# Patient Record
Sex: Female | Born: 1985
Health system: Southern US, Community
[De-identification: ages and names within clinical notes are randomized; demographics above are authoritative.]

## PROBLEM LIST (undated history)

## (undated) DIAGNOSIS — IMO0002 Reserved for concepts with insufficient information to code with codable children: Secondary | ICD-10-CM

## (undated) DIAGNOSIS — U071 COVID-19: Secondary | ICD-10-CM

## (undated) DIAGNOSIS — Z87440 Personal history of urinary (tract) infections: Secondary | ICD-10-CM

## (undated) DIAGNOSIS — I10 Essential (primary) hypertension: Secondary | ICD-10-CM

## (undated) DIAGNOSIS — F429 Obsessive-compulsive disorder, unspecified: Secondary | ICD-10-CM

## (undated) DIAGNOSIS — Z8619 Personal history of other infectious and parasitic diseases: Secondary | ICD-10-CM

## (undated) HISTORY — PX: WISDOM TOOTH EXTRACTION: SHX21

## (undated) HISTORY — DX: COVID-19: U07.1

## (undated) HISTORY — DX: Personal history of other infectious and parasitic diseases: Z86.19

## (undated) HISTORY — DX: Obsessive-compulsive disorder, unspecified: F42.9

## (undated) HISTORY — DX: Personal history of urinary (tract) infections: Z87.440

---

## 2012-12-27 ENCOUNTER — Inpatient Hospital Stay: Payer: Self-pay | Admitting: Obstetrics and Gynecology

## 2012-12-27 LAB — CBC WITH DIFFERENTIAL/PLATELET
Basophil #: 0 10*3/uL (ref 0.0–0.1)
Basophil %: 0.3 %
HGB: 12 g/dL (ref 12.0–16.0)
Lymphocyte %: 12.9 %
MCH: 29.7 pg (ref 26.0–34.0)
MCHC: 34.5 g/dL (ref 32.0–36.0)
Monocyte %: 7.2 %
Neutrophil #: 13.1 10*3/uL — ABNORMAL HIGH (ref 1.4–6.5)
Platelet: 227 10*3/uL (ref 150–440)
RBC: 4.05 10*6/uL (ref 3.80–5.20)
WBC: 16.5 10*3/uL — ABNORMAL HIGH (ref 3.6–11.0)

## 2012-12-28 LAB — HEMATOCRIT: HCT: 30.3 % — ABNORMAL LOW (ref 35.0–47.0)

## 2013-09-25 ENCOUNTER — Ambulatory Visit: Payer: Self-pay

## 2013-09-25 LAB — RAPID STREP-A WITH REFLX: MICRO TEXT REPORT: NEGATIVE

## 2013-09-25 LAB — RAPID INFLUENZA A&B ANTIGENS (ARMC ONLY)

## 2013-09-27 LAB — BETA STREP CULTURE(ARMC)

## 2013-10-09 ENCOUNTER — Emergency Department: Payer: Self-pay | Admitting: Internal Medicine

## 2013-10-12 ENCOUNTER — Emergency Department: Payer: Self-pay | Admitting: Emergency Medicine

## 2013-10-16 ENCOUNTER — Encounter: Payer: Self-pay | Admitting: Emergency Medicine

## 2013-10-23 ENCOUNTER — Ambulatory Visit: Payer: Self-pay | Admitting: Family Medicine

## 2013-11-12 ENCOUNTER — Encounter: Payer: Self-pay | Admitting: Emergency Medicine

## 2014-03-24 ENCOUNTER — Ambulatory Visit: Payer: Self-pay | Admitting: Family Medicine

## 2014-03-24 LAB — CBC WITH DIFFERENTIAL/PLATELET
BASOS PCT: 0.6 %
Basophil #: 0 10*3/uL (ref 0.0–0.1)
Eosinophil #: 0.1 10*3/uL (ref 0.0–0.7)
Eosinophil %: 1 %
HCT: 44.2 % (ref 35.0–47.0)
HGB: 14.5 g/dL (ref 12.0–16.0)
LYMPHS PCT: 29.6 %
Lymphocyte #: 2 10*3/uL (ref 1.0–3.6)
MCH: 30 pg (ref 26.0–34.0)
MCHC: 32.9 g/dL (ref 32.0–36.0)
MCV: 91 fL (ref 80–100)
MONOS PCT: 5.8 %
Monocyte #: 0.4 x10 3/mm (ref 0.2–0.9)
NEUTROS PCT: 63 %
Neutrophil #: 4.2 10*3/uL (ref 1.4–6.5)
Platelet: 314 10*3/uL (ref 150–440)
RBC: 4.85 10*6/uL (ref 3.80–5.20)
RDW: 12.9 % (ref 11.5–14.5)
WBC: 6.6 10*3/uL (ref 3.6–11.0)

## 2014-03-24 LAB — BASIC METABOLIC PANEL
Anion Gap: 11 (ref 7–16)
BUN: 13 mg/dL (ref 7–18)
CO2: 26 mmol/L (ref 21–32)
Calcium, Total: 9.5 mg/dL (ref 8.5–10.1)
Chloride: 105 mmol/L (ref 98–107)
Creatinine: 0.62 mg/dL (ref 0.60–1.30)
EGFR (Non-African Amer.): >60
Glucose: 93 mg/dL (ref 65–99)
Osmolality: 283 (ref 275–301)
POTASSIUM: 4 mmol/L (ref 3.5–5.1)
Sodium: 142 mmol/L (ref 136–145)

## 2014-03-24 LAB — TSH: Thyroid Stimulating Horm: 0.724 u[IU]/mL

## 2014-06-01 ENCOUNTER — Ambulatory Visit (INDEPENDENT_AMBULATORY_CARE_PROVIDER_SITE_OTHER): Payer: No Typology Code available for payment source | Admitting: Internal Medicine

## 2014-06-01 ENCOUNTER — Encounter: Payer: Self-pay | Admitting: Internal Medicine

## 2014-06-01 ENCOUNTER — Encounter (INDEPENDENT_AMBULATORY_CARE_PROVIDER_SITE_OTHER): Payer: Self-pay

## 2014-06-01 VITALS — BP 110/80 | HR 100 | Temp 98.3°F | Ht 65.0 in | Wt 112.5 lb

## 2014-06-01 DIAGNOSIS — Z13220 Encounter for screening for lipoid disorders: Secondary | ICD-10-CM

## 2014-06-01 DIAGNOSIS — F429 Obsessive-compulsive disorder, unspecified: Secondary | ICD-10-CM

## 2014-06-01 DIAGNOSIS — E0789 Other specified disorders of thyroid: Secondary | ICD-10-CM

## 2014-06-01 DIAGNOSIS — F42 Obsessive-compulsive disorder: Secondary | ICD-10-CM

## 2014-06-01 NOTE — Progress Notes (Signed)
Pre visit review using our clinic review tool, if applicable. No additional management support is needed unless otherwise documented below in the visit note. 

## 2014-06-07 ENCOUNTER — Encounter: Payer: Self-pay | Admitting: Internal Medicine

## 2014-06-07 DIAGNOSIS — F429 Obsessive-compulsive disorder, unspecified: Secondary | ICD-10-CM | POA: Insufficient documentation

## 2014-06-07 DIAGNOSIS — E0789 Other specified disorders of thyroid: Secondary | ICD-10-CM | POA: Insufficient documentation

## 2014-06-07 NOTE — Assessment & Plan Note (Signed)
Thyroid fullness noted on exam.  Schedule thyroid ultrasound.  Check thyroid function tests.

## 2014-06-07 NOTE — Assessment & Plan Note (Signed)
Recently diagnosed.  On zoloft.  Followed by her therapist and Dr Marc Morgansoddle.  No suicidal or homicidal thoughts.  Does feel some better.

## 2014-06-07 NOTE — Progress Notes (Signed)
   Subjective:    Patient ID: Becky Mccoy, female    DOB: Oct 20, 1985, 28 y.o.   MRN: [id number]  HPI 28 year old female with past history of OCD.  Recently diagnosed.  Comes in today to establish care.  [facility]  Up to date with exams.  All paps normal.  Seeing Dr Marc Morgansoddle [facility] now.  Started zoloft five weeks ago.  On 200mg  per day now.  Also seeing a therapist.  Takes xanax q hs.  She is more on a schedule eating now.  Stopped 6 months ago - breast feeding.  Works at [employer].  States her OCD was diagnosed recently.  States has a lot of thoughts racing through her brain.  Is doing better.  No suicidal or homicidal thoughts. LMP 05/19/14.  Uses condoms now.     Past Medical History  Diagnosis Date  . History of chicken pox   . Hx: UTI (urinary tract infection)   . OCD (obsessive compulsive disorder)     Outpatient Encounter Prescriptions as of 06/01/2014  Medication Sig  . ALPRAZolam (XANAX) 0.25 MG tablet Take 0.25-0.5 mg by mouth at bedtime as needed for anxiety.  . sertraline (ZOLOFT) 100 MG tablet Take 200 mg by mouth daily.     Review of Systems Patient denies any headache, lightheadedness or dizziness.  No sinus or allergy symptoms.  No chest pain, tightness or palpitations.  No increased shortness of breath, cough or congestion.  No nausea or vomiting.  No abdominal pain or cramping.  No bowel change, such as diarrhea, constipation, BRBPR or melana.  No urine change.  OCD diagnosed recently as outlined.  Some better.  Seeing her psychiatrist and a therapist.       Objective:   Physical Exam Filed Vitals:   06/01/14 0909  BP: 110/80  Pulse: 100  Temp: 98.3 F (2836.278 C)   28 year old female in no acute distress.   HEENT:  Nares- clear.  Oropharynx - without lesions. NECK:  Supple.  Nontender.  No audible bruit.  Appears to have increased thyroid fullness.   HEART:  Appears to be regular. LUNGS:  No crackles or wheezing audible.   Respirations even and unlabored.  RADIAL PULSE:  Equal bilaterally.  ABDOMEN:  Soft, nontender.  Bowel sounds present and normal.  No audible abdominal bruit.   EXTREMITIES:  No increased edema present.  DP pulses palpable and equal bilaterally.          Assessment & Plan:  HEALTH MAINTENANCE.  States up to date with breast, pelvic and pap smears.  Check routine labs.  [facility].    Problem List Items Addressed This Visit   OCD (obsessive compulsive disorder)     Recently diagnosed.  On zoloft.  Followed by her therapist and Dr Marc Morgansoddle.  No suicidal or homicidal thoughts.  Does feel some better.      Thyroid fullness - Primary     Thyroid fullness noted on exam.  Schedule thyroid ultrasound.  Check thyroid function tests.       Relevant Orders      US Soft Tissue Head/Neck     I spent 30 minutes with the patient and more than 50% of the time was spent in consultation regarding the above.

## 2014-08-31 ENCOUNTER — Other Ambulatory Visit: Payer: No Typology Code available for payment source

## 2014-09-02 ENCOUNTER — Ambulatory Visit (INDEPENDENT_AMBULATORY_CARE_PROVIDER_SITE_OTHER): Payer: No Typology Code available for payment source | Admitting: Internal Medicine

## 2014-09-02 ENCOUNTER — Encounter: Payer: Self-pay | Admitting: *Deleted

## 2014-09-02 ENCOUNTER — Encounter: Payer: Self-pay | Admitting: Internal Medicine

## 2014-09-02 DIAGNOSIS — E0789 Other specified disorders of thyroid: Secondary | ICD-10-CM

## 2014-09-02 DIAGNOSIS — Z13220 Encounter for screening for lipoid disorders: Secondary | ICD-10-CM

## 2014-09-02 DIAGNOSIS — F42 Obsessive-compulsive disorder: Secondary | ICD-10-CM

## 2014-09-02 DIAGNOSIS — F429 Obsessive-compulsive disorder, unspecified: Secondary | ICD-10-CM

## 2014-09-02 LAB — CBC WITH DIFFERENTIAL/PLATELET
Basophils Absolute: 0 10*3/uL (ref 0.0–0.1)
Basophils Relative: 0.5 % (ref 0.0–3.0)
Eosinophils Absolute: 0.1 10*3/uL (ref 0.0–0.7)
Eosinophils Relative: 2.5 % (ref 0.0–5.0)
HCT: 41.8 % (ref 36.0–46.0)
Hemoglobin: 14.4 g/dL (ref 12.0–15.0)
LYMPHS PCT: 33.1 % (ref 12.0–46.0)
Lymphs Abs: 2 10*3/uL (ref 0.7–4.0)
MCHC: 34.5 g/dL (ref 30.0–36.0)
MCV: 87.8 fl (ref 78.0–100.0)
MONOS PCT: 5.5 % (ref 3.0–12.0)
Monocytes Absolute: 0.3 10*3/uL (ref 0.1–1.0)
NEUTROS ABS: 3.5 10*3/uL (ref 1.4–7.7)
Neutrophils Relative %: 58.4 % (ref 43.0–77.0)
Platelets: 243 10*3/uL (ref 150.0–400.0)
RBC: 4.75 Mil/uL (ref 3.87–5.11)
RDW: 13.1 % (ref 11.5–15.5)
WBC: 6 10*3/uL (ref 4.0–10.5)

## 2014-09-02 LAB — LIPID PANEL
CHOLESTEROL: 214 mg/dL — AB (ref 0–200)
HDL: 48.1 mg/dL (ref >39.00)
LDL Cholesterol: 130 mg/dL — ABNORMAL HIGH (ref 0–99)
NonHDL: 165.9
TRIGLYCERIDES: 179 mg/dL — AB (ref 0.0–149.0)
Total CHOL/HDL Ratio: 4
VLDL: 35.8 mg/dL (ref 0.0–40.0)

## 2014-09-02 LAB — COMPREHENSIVE METABOLIC PANEL
ALBUMIN: 4.5 g/dL (ref 3.5–5.2)
ALT: 17 U/L (ref 0–35)
AST: 20 U/L (ref 0–37)
Alkaline Phosphatase: 66 U/L (ref 39–117)
BUN: 18 mg/dL (ref 6–23)
CHLORIDE: 105 meq/L (ref 96–112)
CO2: 30 mEq/L (ref 19–32)
Calcium: 9.4 mg/dL (ref 8.4–10.5)
Creatinine, Ser: 0.61 mg/dL (ref 0.40–1.20)
GFR: 124.37 mL/min (ref >60.00)
Glucose, Bld: 87 mg/dL (ref 70–99)
POTASSIUM: 4.7 meq/L (ref 3.5–5.1)
SODIUM: 140 meq/L (ref 135–145)
TOTAL PROTEIN: 6.8 g/dL (ref 6.0–8.3)
Total Bilirubin: 0.6 mg/dL (ref 0.2–1.2)

## 2014-09-02 LAB — T4, FREE: FREE T4: 0.67 ng/dL (ref 0.60–1.60)

## 2014-09-02 LAB — TSH: TSH: 1.38 u[IU]/mL (ref 0.35–4.50)

## 2014-09-02 LAB — T3, FREE: T3, Free: 3.4 pg/mL (ref 2.3–4.2)

## 2014-09-02 NOTE — Progress Notes (Signed)
Pre visit review using our clinic review tool, if applicable. No additional management support is needed unless otherwise documented below in the visit note. 

## 2014-09-06 ENCOUNTER — Encounter: Payer: Self-pay | Admitting: Internal Medicine

## 2014-09-06 NOTE — Progress Notes (Signed)
   Subjective:    Patient ID: Becky Mccoy, female    DOB: Jun 02, 1986, 29 y.o.   MRN: [id number]  HPI 29 year old female with past history of OCD.  She comes in today for a scheduled follow up.  [facility].  Up to date with exams.  All paps normal.  Seeing Dr Marc Morgansoddle ([facility]).  On zoloft 200mg  per day.  Doing well with this.  feels better.  Functioning better.  Concentrating better.  Overall feels good.  Eating better.      Past Medical History  Diagnosis Date  . History of chicken pox   . Hx: UTI (urinary tract infection)   . OCD (obsessive compulsive disorder)     Outpatient Encounter Prescriptions as of 09/02/2014  Medication Sig  . clonazePAM (KLONOPIN) 1 MG tablet Take 0.5-1 mg by mouth at bedtime as needed for anxiety.  . sertraline (ZOLOFT) 100 MG tablet Take 200 mg by mouth daily.   . [DISCONTINUED] ALPRAZolam (XANAX) 0.25 MG tablet Take 0.25-0.5 mg by mouth at bedtime as needed for anxiety.    Review of Systems Patient denies any headache, lightheadedness or dizziness.  No sinus or allergy symptoms.  No chest pain, tightness or palpitations. No increased shortness of breath, cough or congestion.  No nausea or vomiting.  No abdominal pain or cramping.  No bowel change, such as diarrhea, constipation.  OCD diagnosed recently as outlined.  Seeing her psychiatrist and a therapist.  On zoloft 200mg  per day and doing well with this.  Feels better.       Objective:   Physical Exam  Filed Vitals:   09/02/14 0919  BP: 100/70  Pulse: 75  Temp: 98.2 F (6536.188 C)   29 year old female in no acute distress.   HEENT:  Nares- clear.  Oropharynx - without lesions. NECK:  Supple.  Nontender.  No audible bruit.  Appears to have increased thyroid fullness.   HEART:  Appears to be regular. LUNGS:  No crackles or wheezing audible.  Respirations even and unlabored.  RADIAL PULSE:  Equal bilaterally.  ABDOMEN:  Soft, nontender.  Bowel sounds present and  normal.  No audible abdominal bruit.   EXTREMITIES:  No increased edema present.  DP pulses palpable and equal bilaterally.          Assessment & Plan:  1. OCD (obsessive compulsive disorder) On zoloft.  Doing well.  Feels much better.  Followed at [facility]. - CBC with Differential - Comprehensive metabolic panel  2. Thyroid fullness Schedule thyroid ultrasound.  Never had.  Check thyroid function tests. - TSH - T3, free - T4, free  3. Screening cholesterol level - Lipid panel  HEALTH MAINTENANCE.  States up to date with breast, pelvic and pap smears.  Check routine labs.  [facility].

## 2014-09-15 ENCOUNTER — Ambulatory Visit: Payer: Self-pay | Admitting: Internal Medicine

## 2014-09-15 ENCOUNTER — Telehealth: Payer: Self-pay | Admitting: Internal Medicine

## 2014-09-15 NOTE — Telephone Encounter (Signed)
Notify pt that her thyroid ultrasound was normal.

## 2014-09-15 NOTE — Telephone Encounter (Signed)
Pt.notified

## 2014-09-24 ENCOUNTER — Encounter: Payer: Self-pay | Admitting: Internal Medicine

## 2014-11-16 ENCOUNTER — Ambulatory Visit
Admit: 2014-11-16 | Disposition: A | Discharge status: Home / Self Care | Payer: Self-pay | Attending: Family Medicine | Admitting: Family Medicine

## 2014-11-16 LAB — RAPID STREP-A WITH REFLX: Micro Text Report: NEGATIVE

## 2014-11-16 LAB — RAPID INFLUENZA A&B ANTIGENS

## 2014-11-22 LAB — BETA STREP CULTURE(ARMC)

## 2014-12-22 NOTE — H&P (Signed)
L&D Evaluation:  History:  HPI 29 yo G3P1011 at 3917w5d gestation presenting in term labor.  [facility].  O neg / ABSC neg / RNI / VZI / HBsAg neg / HIV neg / 1-hr OGTT 109 / GBS neg / rhogam 10/02/12 / TDAP 10/29/12   Presents with contractions   Patient's Medical History No Chronic Illness   Patient's Surgical History none   Medications Pre Natal Vitamins   Allergies Sulfa   Social History none   Family History Non-Contributory   ROS:  ROS All systems were reviewed.  HEENT, CNS, GI, GU, Respiratory, CV, Renal and Musculoskeletal systems were found to be normal.   Exam:  Vital Signs stable   Mental Status clear   Chest no increased work of breathing   Abdomen gravid, tender with contractions   Estimated Fetal Weight Average for gestational age   Pelvic no external lesions, 4-5 per nursing staff from 3cm in course of 1-hr   Mebranes Intact   FHT normal rate with no decels   Ucx regular   Impression:  Impression active labor   Plan:  Plan EFM/NST, monitor contractions and for cervical change   Comments admit for term labor   Electronic Signatures: Lorrene ReidStaebler, Zendaya Groseclose M (MD)  (Signed 16-May-14 06:59)  Authored: L&D Evaluation   Last Updated: 16-May-14 06:59 by Lorrene ReidStaebler, Gracey Tolle M (MD)

## 2015-01-22 ENCOUNTER — Encounter: Payer: Self-pay | Admitting: *Deleted

## 2015-01-22 ENCOUNTER — Emergency Department
Admission: EM | Admit: 2015-01-22 | Discharge: 2015-01-22 | Disposition: A | Discharge status: Home / Self Care | Payer: Managed Care, Other (non HMO) | Attending: Emergency Medicine | Admitting: Emergency Medicine

## 2015-01-22 DIAGNOSIS — S61451A Open bite of right hand, initial encounter: Secondary | ICD-10-CM | POA: Insufficient documentation

## 2015-01-22 DIAGNOSIS — Y998 Other external cause status: Secondary | ICD-10-CM | POA: Insufficient documentation

## 2015-01-22 DIAGNOSIS — W5501XA Bitten by cat, initial encounter: Secondary | ICD-10-CM | POA: Diagnosis not present

## 2015-01-22 DIAGNOSIS — S60511A Abrasion of right hand, initial encounter: Secondary | ICD-10-CM

## 2015-01-22 DIAGNOSIS — Y9389 Activity, other specified: Secondary | ICD-10-CM | POA: Insufficient documentation

## 2015-01-22 DIAGNOSIS — Y9289 Other specified places as the place of occurrence of the external cause: Secondary | ICD-10-CM | POA: Insufficient documentation

## 2015-01-22 DIAGNOSIS — Z79899 Other long term (current) drug therapy: Secondary | ICD-10-CM | POA: Diagnosis not present

## 2015-01-22 HISTORY — DX: Reserved for concepts with insufficient information to code with codable children: IMO0002

## 2015-01-22 MED ORDER — AMOXICILLIN-POT CLAVULANATE 875-125 MG PO TABS: 1.0000 | ORAL_TABLET | Freq: Two times a day (BID) | ORAL | Status: AC

## 2015-01-22 NOTE — ED Notes (Signed)
Cat bite to right hand

## 2015-01-22 NOTE — ED Notes (Signed)
Pt was bit by a neighbors cat yesterday afternoon. Pt bit at the base of the rt palm. The cat is not up to date on vaccines. Pt did complete the rabies series last year.

## 2015-01-22 NOTE — ED Notes (Signed)
[employer] notified that patient had been bit by cat. Officer to come by to see patient.

## 2015-01-22 NOTE — ED Provider Notes (Signed)
CSN: [id number]     Arrival date & time 01/22/15  2027 History   None    Chief Complaint  Patient presents with  . Animal Bite     (Consider location/radiation/quality/duration/timing/severity/associated sxs/prior Treatment) Patient is a 29 y.o. female presenting with animal bite.  Animal Bite Associated symptoms: no fever and no numbness    patient states one day ago she was bitten by a neighbor's cat on the right hand thenar eminence. She has small abrasion with no warmth erythema or drainage. Pain is minimal. She states she has been vaccinated for rabies in the past. Cat is known, lives with her next-door neighbor.Cat full-size, white and brown. Patient denies any numbness or tingling. Her tetanus is up-to-date.  Past Medical History  Diagnosis Date  . History of chicken pox   . Hx: UTI (urinary tract infection)   . OCD (obsessive compulsive disorder)   . Obsessive-compulsive and related disorder    No past surgical history on file. Family History  Problem Relation Age of Onset  . Cancer - Other Maternal Grandfather     stomach cancer  . Cancer - Other Paternal Grandfather     Bone cancer   History  Substance Use Topics  . Smoking status: Never Smoker   . Smokeless tobacco: Never Used  . Alcohol Use: No   OB History    No data available     Review of Systems  Constitutional: Negative for fever, chills, activity change and fatigue.  HENT: Negative for congestion, sinus pressure and sore throat.   Eyes: Negative for visual disturbance.  Respiratory: Negative for cough, chest tightness and shortness of breath.   Cardiovascular: Negative for chest pain and leg swelling.  Gastrointestinal: Negative for nausea, vomiting, abdominal pain and diarrhea.  Genitourinary: Negative for dysuria.  Musculoskeletal: Negative for arthralgias and gait problem.  Skin: Positive for wound.  Neurological: Negative for weakness, numbness and headaches.  Hematological: Negative for  adenopathy.  Psychiatric/Behavioral: Negative for behavioral problems, confusion and agitation.      Allergies  Sulfa antibiotics  Home Medications   Prior to Admission medications   Medication Sig Start Date End Date Taking? Authorizing Provider  amoxicillin-clavulanate (AUGMENTIN) 875-125 MG per tablet Take 1 tablet by mouth 2 (two) times daily. 01/22/15 02/01/15  Evon Slack, PA-C  clonazePAM (KLONOPIN) 1 MG tablet Take 0.5-1 mg by mouth at bedtime as needed for anxiety.    Historical Provider, MD  sertraline (ZOLOFT) 100 MG tablet Take 200 mg by mouth daily.     Historical Provider, MD   BP 121/84 mmHg  Pulse 83  Temp(Src) 98 F (36.7 C) (Axillary)  Resp 17  Ht  (1.676 m)  Wt 134 lb (60.782 kg)  BMI 21.64 kg/m2  SpO2 99%  LMP 01/08/2015 Physical Exam  Constitutional: She is oriented to person, place, and time. She appears well-developed and well-nourished. No distress.  HENT:  Head: Normocephalic and atraumatic.  Mouth/Throat: Oropharynx is clear and moist.  Eyes: EOM are normal. Pupils are equal, round, and reactive to light. Right eye exhibits no discharge. Left eye exhibits no discharge.  Neck: Normal range of motion. Neck supple.  Cardiovascular: Normal rate, regular rhythm and intact distal pulses.   Pulmonary/Chest: Effort normal and breath sounds normal. No respiratory distress. She exhibits no tenderness.  Abdominal: Soft. She exhibits no distension. There is no tenderness.  Musculoskeletal: Normal range of motion. She exhibits no edema.  Full composite fist with grip strength 5 out of 5.  No tendon deficits noted. No joint swelling  Neurological: She is alert and oriented to person, place, and time. She has normal reflexes.  Skin: Skin is warm and dry.  2 cm abrasion to the right thenar eminence, there is superficial with no warmth erythema or drainage.  Psychiatric: She has a normal mood and affect. Her behavior is normal. Thought content normal.    ED  Course  Procedures (including critical care time) Labs Review Labs Reviewed - No data to display  Imaging Review No results found.   EKG Interpretation None      MDM   Final diagnoses:  Cat bite, initial encounter  Abrasion, hand, right, initial encounter    1. Patient given a prescription for prophylactic antibiotic, Augmentin. Patient has a very small superficial abrasion with no signs of infection. She has cleaned the abrasion thoroughly. Animal control was notified. Cat is known and can be observed for 10 days patient will follow-up for any worsening symptoms or urgent changes in her health     Evon Slack, PA-C 01/22/15 2123  Minna Antis, MD 01/22/15 2324

## 2015-01-22 NOTE — Discharge Instructions (Signed)
Animal Bite °An animal bite can result in a scratch on the skin, deep open cut, puncture of the skin, crush injury, or tearing away of the skin or a body part. Dogs are responsible for most animal bites. Children are bitten more often than adults. An animal bite can range from very mild to more serious. A small bite from your house pet is no cause for alarm. However, some animal bites can become infected or injure a bone or other tissue. You must seek medical care if: °· The skin is broken and bleeding does not slow down or stop after 15 minutes. °· The puncture is deep and difficult to clean (such as a cat bite). °· Pain, warmth, redness, or pus develops around the wound. °· The bite is from a stray animal or rodent. There may be a risk of rabies infection. °· The bite is from a snake, raccoon, skunk, fox, coyote, or bat. There may be a risk of rabies infection. °· The person bitten has a chronic illness such as diabetes, liver disease, or cancer, or the person takes medicine that lowers the immune system. °· There is concern about the location and severity of the bite. °It is important to clean and protect an animal bite wound right away to prevent infection. Follow these steps: °· Clean the wound with plenty of water and soap. °· Apply an antibiotic cream. °· Apply gentle pressure over the wound with a clean towel or gauze to slow or stop bleeding. °· Elevate the affected area above the heart to help stop any bleeding. °· Seek medical care. Getting medical care within 8 hours of the animal bite leads to the best possible outcome. °DIAGNOSIS  °Your caregiver will most likely: °· Take a detailed history of the animal and the bite injury. °· Perform a wound exam. °· Take your medical history. °Blood tests or X-rays may be performed. Sometimes, infected bite wounds are cultured and sent to a lab to identify the infectious bacteria.  °TREATMENT  °Medical treatment will depend on the location and type of animal bite as  well as the patient's medical history. Treatment may include: °· Wound care, such as cleaning and flushing the wound with saline solution, bandaging, and elevating the affected area. °· Antibiotics. °· Tetanus immunization. °· Rabies immunization. °· Leaving the wound open to heal. This is often done with animal bites, due to the high risk of infection. However, in certain cases, wound closure with stitches, wound adhesive, skin adhesive strips, or staples may be used. ° Infected bites that are left untreated may require intravenous (IV) antibiotics and surgical treatment in the hospital. °HOME CARE INSTRUCTIONS °· Follow your caregiver's instructions for wound care. °· Take all medicines as directed. °· If your caregiver prescribes antibiotics, take them as directed. Finish them even if you start to feel better. °· Follow up with your caregiver for further exams or immunizations as directed. °You may need a tetanus shot if: °· You cannot remember when you had your last tetanus shot. °· You have never had a tetanus shot. °· The injury broke your skin. °If you get a tetanus shot, your arm may swell, get red, and feel warm to the touch. This is common and not a problem. If you need a tetanus shot and you choose not to have one, there is a rare chance of getting tetanus. Sickness from tetanus can be serious. °SEEK MEDICAL CARE IF: °· You notice warmth, redness, soreness, swelling, pus discharge, or a bad   smell coming from the wound.  You have a red line on the skin coming from the wound.  You have a fever, chills, or a general ill feeling.  You have nausea or vomiting.  You have continued or worsening pain.  You have trouble moving the injured part.  You have other questions or concerns. MAKE SURE YOU:  Understand these instructions.  Will watch your condition.  Will get help right away if you are not doing well or get worse. Document Released: 04/18/2011 Document Revised: 10/23/2011 Document  Reviewed: 04/18/2011 [facility] Patient Information 2015 [place], [place]. This information is not intended to replace advice given to you by your health care provider. Make sure you discuss any questions you have with your health care provider.  Abrasion An abrasion is a cut or scrape of the skin. Abrasions do not extend through all layers of the skin and most heal within 10 days. It is important to care for your abrasion properly to prevent infection. CAUSES  Most abrasions are caused by falling on, or gliding across, the ground or other surface. When your skin rubs on something, the outer and inner layer of skin rubs off, causing an abrasion. DIAGNOSIS  Your caregiver will be able to diagnose an abrasion during a physical exam.  TREATMENT  Your treatment depends on how large and deep the abrasion is. Generally, your abrasion will be cleaned with water and a mild soap to remove any dirt or debris. An antibiotic ointment may be put over the abrasion to prevent an infection. A bandage (dressing) may be wrapped around the abrasion to keep it from getting dirty.  You may need a tetanus shot if:  You cannot remember when you had your last tetanus shot.  You have never had a tetanus shot.  The injury broke your skin. If you get a tetanus shot, your arm may swell, get red, and feel warm to the touch. This is common and not a problem. If you need a tetanus shot and you choose not to have one, there is a rare chance of getting tetanus. Sickness from tetanus can be serious.  HOME CARE INSTRUCTIONS   If a dressing was applied, change it at least once a day or as directed by your caregiver. If the bandage sticks, soak it off with warm water.   Wash the area with water and a mild soap to remove all the ointment 2 times a day. Rinse off the soap and pat the area dry with a clean towel.   Reapply any ointment as directed by your caregiver. This will help prevent infection and keep the bandage from  sticking. Use gauze over the wound and under the dressing to help keep the bandage from sticking.   Change your dressing right away if it becomes wet or dirty.   Only take over-the-counter or prescription medicines for pain, discomfort, or fever as directed by your caregiver.   Follow up with your caregiver within 24-48 hours for a wound check, or as directed. If you were not given a wound-check appointment, look closely at your abrasion for redness, swelling, or pus. These are signs of infection. SEEK IMMEDIATE MEDICAL CARE IF:   You have increasing pain in the wound.   You have redness, swelling, or tenderness around the wound.   You have pus coming from the wound.   You have a fever or persistent symptoms for more than 2-3 days.  You have a fever and your symptoms suddenly get worse.  You have a bad smell coming from the wound or dressing.  MAKE SURE YOU:   Understand these instructions.  Will watch your condition.  Will get help right away if you are not doing well or get worse. Document Released: 05/10/2005 Document Revised: 07/17/2012 Document Reviewed: 07/04/2011 [facility] Patient Information 2015 [place]. This information is not intended to replace advice given to you by your health care provider. Make sure you discuss any questions you have with your health care provider.

## 2015-02-24 ENCOUNTER — Ambulatory Visit (INDEPENDENT_AMBULATORY_CARE_PROVIDER_SITE_OTHER): Payer: Managed Care, Other (non HMO) | Admitting: Internal Medicine

## 2015-02-24 ENCOUNTER — Encounter: Payer: Self-pay | Admitting: Internal Medicine

## 2015-02-24 VITALS — BP 103/70 | HR 97 | Temp 98.4°F | Resp 18 | Ht 65.0 in | Wt 138.2 lb

## 2015-02-24 DIAGNOSIS — F429 Obsessive-compulsive disorder, unspecified: Secondary | ICD-10-CM

## 2015-02-24 DIAGNOSIS — F42 Obsessive-compulsive disorder: Secondary | ICD-10-CM | POA: Diagnosis not present

## 2015-02-24 DIAGNOSIS — E0789 Other specified disorders of thyroid: Secondary | ICD-10-CM

## 2015-02-24 DIAGNOSIS — Z0000 Encounter for general adult medical examination without abnormal findings: Secondary | ICD-10-CM

## 2015-02-24 NOTE — Progress Notes (Signed)
Pre visit review using our clinic review tool, if applicable. No additional management support is needed unless otherwise documented below in the visit note. 

## 2015-02-24 NOTE — Progress Notes (Signed)
Patient ID: Becky Mccoy, female   DOB: [birth year], 29 y.o.   MRN: [id number]   Subjective:    Patient ID: Becky Arnoldaroline Spencer Kilbride, female    DOB: [birth year], 29 y.o.   MRN: [id number]  HPI  Patient here for a scheduled follow up.  Still seeing psychiatry regularly. Taking 250mg  of zoloft daily.  Doing better on this dose.  Is gaining weight.   Plans to discuss with her psychiatrist tomorrow.  She is eating better.  Sleeping better.  Bowels moving.  Occasionally will take miralax.  No significant problems with constipation.     Past Medical History  Diagnosis Date  . History of chicken pox   . Hx: UTI (urinary tract infection)   . OCD (obsessive compulsive disorder)   . Obsessive-compulsive and related disorder     Outpatient Encounter Prescriptions as of 02/24/2015  Medication Sig  . clonazePAM (KLONOPIN) 0.5 MG tablet TAKE 1 TABLETS BY MOUTH AT BEDTIME AS NEEDED FOR INSOMNIA  . sertraline (ZOLOFT) 100 MG tablet Take 250 mg by mouth daily.   . [DISCONTINUED] clonazePAM (KLONOPIN) 1 MG tablet Take 0.5-1 mg by mouth at bedtime as needed for anxiety.   No facility-administered encounter medications on file as of 02/24/2015.    Review of Systems  Constitutional: Negative for appetite change and unexpected weight change.  HENT: Negative for congestion and sinus pressure.   Respiratory: Negative for cough, chest tightness and shortness of breath.   Cardiovascular: Negative for chest pain, palpitations and leg swelling.  Gastrointestinal: Positive for constipation (minimal.  not a signficant problem.  occasionally will use miralax. ). Negative for nausea, abdominal pain and diarrhea.  Neurological: Negative for dizziness, light-headedness and headaches.  Psychiatric/Behavioral: Negative for dysphoric mood and agitation.       Seeing psychiatry.  On zoloft.  Has f/u planned tomorrow.         Objective:    Physical Exam  Constitutional: She appears well-developed and  well-nourished. No distress.  HENT:  Nose: Nose normal.  Mouth/Throat: Oropharynx is clear and moist.  Neck: Neck supple. No thyromegaly present.  Cardiovascular: Normal rate and regular rhythm.   Pulmonary/Chest: Breath sounds normal. No respiratory distress. She has no wheezes.  Abdominal: Soft. Bowel sounds are normal. There is no tenderness.  Musculoskeletal: She exhibits no edema or tenderness.  Lymphadenopathy:    She has no cervical adenopathy.  Skin: No rash noted. No erythema.  Psychiatric: She has a normal mood and affect. Her behavior is normal.    BP 103/70 mmHg  Pulse 97  Temp(Src) 98.4 F (36.9 C) (Oral)  Resp 18  Ht 5\' 5"  (1.651 m)  Wt 138 lb 4 oz (62.71 kg)  BMI 23.01 kg/m2  SpO2 99% Wt Readings from Last 3 Encounters:  02/24/15 138 lb 4 oz (62.71 kg)  01/22/15 134 lb (60.782 kg)  09/02/14 127 lb 12 oz (57.947 kg)     Lab Results  Component Value Date   WBC 6.0 09/02/2014   HGB 14.4 09/02/2014   HCT 41.8 09/02/2014   PLT 243.0 09/02/2014   GLUCOSE 87 09/02/2014   CHOL 214* 09/02/2014   TRIG 179.0* 09/02/2014   HDL 48.10 09/02/2014   LDLCALC 130* 09/02/2014   ALT 17 09/02/2014   AST 20 09/02/2014   NA 140 09/02/2014   K 4.7 09/02/2014   CL 105 09/02/2014   CREATININE 0.61 09/02/2014   BUN 18 09/02/2014   CO2 30 09/02/2014   TSH 1.38 09/02/2014  Assessment & Plan:   Problem List Items Addressed This Visit    Health care maintenance    Gets her breast, pelvic and pap smears through [facility].  Obtain records.        OCD (obsessive compulsive disorder)    Seeing psychiatry.  On zoloft  q day.  Doing better on this dose.  Plans to discuss weight gain with him.  Follow.  Sees Dr Marc Morgans.  Has f/u tomorrow.        Thyroid fullness - Primary    Previous thyroid ultrasound - ok.  Thyroid function wnl.            Dale Phillips, MD

## 2015-02-25 ENCOUNTER — Encounter: Payer: Self-pay | Admitting: Internal Medicine

## 2015-02-25 DIAGNOSIS — Z0000 Encounter for general adult medical examination without abnormal findings: Secondary | ICD-10-CM | POA: Insufficient documentation

## 2015-02-25 NOTE — Assessment & Plan Note (Signed)
Gets her breast, pelvic and pap smears through [facility].  Obtain records.

## 2015-02-25 NOTE — Assessment & Plan Note (Signed)
Seeing psychiatry.  On zoloft 250mg  q day.  Doing better on this dose.  Plans to discuss weight gain with him.  Follow.  Sees Dr Marc Morgansoddle.  Has f/u tomorrow.

## 2015-02-25 NOTE — Assessment & Plan Note (Signed)
Previous thyroid ultrasound - ok.  Thyroid function wnl.

## 2015-03-03 ENCOUNTER — Ambulatory Visit: Payer: No Typology Code available for payment source | Admitting: Internal Medicine

## 2015-06-02 ENCOUNTER — Ambulatory Visit
Admission: EM | Admit: 2015-06-02 | Discharge: 2015-06-02 | Disposition: A | Discharge status: Home / Self Care | Payer: Managed Care, Other (non HMO) | Attending: Family Medicine | Admitting: Family Medicine

## 2015-06-02 DIAGNOSIS — S61219A Laceration without foreign body of unspecified finger without damage to nail, initial encounter: Secondary | ICD-10-CM

## 2015-06-02 NOTE — ED Provider Notes (Signed)
[facility] Emergency Department Provider Note  ____________________________________________  Time seen: Approximately 2:35 PM  I have reviewed the triage vital signs and the nursing notes.   HISTORY  Chief Complaint Laceration   HPI Becky Mccoy is a 29 y.o. female presents for complaints or laceration to right middle finger. Reports that just prior to arrival she wash washing dishes and states she grabbed a glass and did not realize the glass had broken. States the glass then cut her right middle finger. States no fall or head injury. States she did not hit her finger on anything but just lightly touch the glass causing laceration. States minimal pain. Denies difficulty moving finger. Denies numbness or tingling sensation. Denies other injury. Reports tetanus immunization is up to date.    Past Medical History  Diagnosis Date  . History of chicken pox   . Hx: UTI (urinary tract infection)   . OCD (obsessive compulsive disorder)   . Obsessive-compulsive and related disorder     Patient Active Problem List   Diagnosis Date Noted  . Health care maintenance 02/25/2015  . Thyroid fullness 06/07/2014  . OCD (obsessive compulsive disorder) 06/07/2014    History reviewed. No pertinent past surgical history.  Current Outpatient Rx  Name  Route  Sig  Dispense  Refill  . clonazePAM (KLONOPIN) 0.5 MG tablet      TAKE 1 TABLETS BY MOUTH AT BEDTIME AS NEEDED FOR INSOMNIA      2   . sertraline (ZOLOFT) 100 MG tablet   Oral   Take 250 mg by mouth daily.            Allergies Sulfa antibiotics  Family History  Problem Relation Age of Onset  . Cancer - Other Maternal Grandfather     stomach cancer  . Cancer - Other Paternal Grandfather     Bone cancer    Social History Social History  Substance Use Topics  . Smoking status: Never Smoker   . Smokeless tobacco: Never Used  . Alcohol Use: No    Review of Systems Constitutional: No  fever/chills Eyes: No visual changes. ENT: No sore throat. Cardiovascular: Denies chest pain. Respiratory: Denies shortness of breath. Gastrointestinal: No abdominal pain.  No nausea, no vomiting.  No diarrhea.  No constipation. Genitourinary: Negative for dysuria. Musculoskeletal: Negative for back pain. Skin: Negative for rash. Positive laceration.  Neurological: Negative for headaches, focal weakness or numbness.  10-point ROS otherwise negative.  ____________________________________________   PHYSICAL EXAM:  VITAL SIGNS: ED Triage Vitals  Enc Vitals Group     BP 06/02/15 1408 106/53 mmHg     Pulse -- 74     Resp 06/02/15 1408 16     Temp 06/02/15 1408 98.8 F (37.1 C)     Temp Source 06/02/15 1408 Tympanic     SpO2 06/02/15 1408 100 %     Weight 06/02/15 1408 143 lb (64.864 kg)     Height 06/02/15 1408 5\' 6"  (1.676 m)     Head Cir --      Peak Flow --      Pain Score 06/02/15 1406 7     Pain Loc --      Pain Edu? --      Excl. in GC? --     Constitutional: Alert and oriented. Well appearing and in no acute distress. Eyes: Conjunctivae are normal. PERRL. EOMI. Head: Atraumatic.  Mouth/Throat: Mucous membranes are moist.   Cardiovascular: Normal rate, regular rhythm. Grossly normal  heart sounds.  Good peripheral circulation. Respiratory: Normal respiratory effort.  No retractions. Lungs CTAB. Musculoskeletal: No lower or upper extremity tenderness nor edema. See skin below.  Neurologic:  Normal speech and language. No gross focal neurologic deficits are appreciated.  Skin:  Skin is warm, dry and intact. No rash noted. Except: Right distal palmer surface third distal phalanx with 1 cm superficial flap laceration, no active bleeding, no foreign body visualized, minimal TTP, full ROM to third finger as well as to right hand, no tendon or motor deficits, sensation and motor intact. Skin otherwise intact. Cap refill <2 seconds.  Psychiatric: Mood and affect are normal.  Speech and behavior are normal.   PROCEDURES  Procedure(s) performed:  Procedure(s) performed:  Procedure explained and verbal consent obtained. Consent: Verbal consent obtained. Written consent not obtained. Risks and benefits: risks, benefits and alternatives were discussed Patient identity confirmed: verbally with patient and hospital-assigned identification number  Consent given by: patient   Laceration Repair Location: right third distal phalanx Length: 1 cm Foreign bodies: no foreign bodies Tendon involvement: none Nerve involvement: none Preparation: Patient was prepped and draped in the usual sterile fashion. Irrigation solution: saline and betadine Irrigation method: jet lavage Amount of cleaning: copious No suture repair indicated. Repaired with sterile topical skin adhesive.  Patient tolerate well. Wound well approximated post repair.  dressing applied.  Wound care instructions provided.  Observe for any signs of infection or other problems.     ____________________________________________   INITIAL IMPRESSION / ASSESSMENT AND PLAN / ED COURSE  Pertinent labs & imaging results that were available during my care of the patient were reviewed by me and considered in my medical decision making (see chart for details).  Very well appearing. No acute distress. Presents for complaints of right distal third finger laceration from touching broken drinking glass. Laceration superficial. NO sutures indication. Wound cleaned and repaired with topical adhesive. The wound was cleansed and dressed. The patient is alerted to watch for any signs of infection (redness, pus, pain, increased swelling or fever) and call if such occurs. Home wound care instructions are provided. Tetanus vaccination status reviewed up to date. Discussed follow up with Primary care physician this week. Discussed follow up and return parameters including no resolution or any worsening concerns. Patient  verbalized understanding and agreed to plan.    ____________________________________________   FINAL CLINICAL IMPRESSION(S) / ED DIAGNOSES  Final diagnoses:  Finger laceration, initial encounter       Renford Dills, NP 06/02/15 1557

## 2015-06-02 NOTE — ED Notes (Signed)
Pt states "laceration to right middle finger while washing dishing." No active bleeding. Soaked in normal saline and betadine.  Tetanus is up to date.

## 2015-06-02 NOTE — Discharge Instructions (Signed)
Laceration Care, Adult  A laceration is a cut that goes through all layers of the skin. The cut also goes into the tissue that is right under the skin. Some cuts heal on their own. Others need to be closed with stitches (sutures), staples, skin adhesive strips, or wound glue. Taking care of your cut lowers your risk of infection and helps your cut to heal better.  HOW TO TAKE CARE OF YOUR CUT  For stitches or staples:  · Keep the wound clean and dry.  · If you were given a bandage (dressing), you should change it at least one time per day or as told by your doctor. You should also change it if it gets wet or dirty.  · Keep the wound completely dry for the first 24 hours or as told by your doctor. After that time, you may take a shower or a bath. However, make sure that the wound is not soaked in water until after the stitches or staples have been removed.  · Clean the wound one time each day or as told by your doctor:    Wash the wound with soap and water.    Rinse the wound with water until all of the soap comes off.    Pat the wound dry with a clean towel. Do not rub the wound.  · After you clean the wound, put a thin layer of antibiotic ointment on it as told by your doctor. This ointment:    Helps to prevent infection.    Keeps the bandage from sticking to the wound.  · Have your stitches or staples removed as told by your doctor.  If your doctor used skin adhesive strips:   · Keep the wound clean and dry.  · If you were given a bandage, you should change it at least one time per day or as told by your doctor. You should also change it if it gets dirty or wet.  · Do not get the skin adhesive strips wet. You can take a shower or a bath, but be careful to keep the wound dry.  · If the wound gets wet, pat it dry with a clean towel. Do not rub the wound.  · Skin adhesive strips fall off on their own. You can trim the strips as the wound heals. Do not remove any strips that are still stuck to the wound. They will  fall off after a while.  If your doctor used wound glue:  · Try to keep your wound dry, but you may briefly wet it in the shower or bath. Do not soak the wound in water, such as by swimming.  · After you take a shower or a bath, gently pat the wound dry with a clean towel. Do not rub the wound.  · Do not do any activities that will make you really sweaty until the skin glue has fallen off on its own.  · Do not apply liquid, cream, or ointment medicine to your wound while the skin glue is still on.  · If you were given a bandage, you should change it at least one time per day or as told by your doctor. You should also change it if it gets dirty or wet.  · If a bandage is placed over the wound, do not let the tape for the bandage touch the skin glue.  · Do not pick at the glue. The skin glue usually stays on for 5-10 days. Then, it   falls off of the skin.  General Instructions   · To help prevent scarring, make sure to cover your wound with sunscreen whenever you are outside after stitches are removed, after adhesive strips are removed, or when wound glue stays in place and the wound is healed. Make sure to wear a sunscreen of at least 30 SPF.  · Take over-the-counter and prescription medicines only as told by your doctor.  · If you were given antibiotic medicine or ointment, take or apply it as told by your doctor. Do not stop using the antibiotic even if your wound is getting better.  · Do not scratch or pick at the wound.  · Keep all follow-up visits as told by your doctor. This is important.  · Check your wound every day for signs of infection. Watch for:    Redness, swelling, or pain.    Fluid, blood, or pus.  · Raise (elevate) the injured area above the level of your heart while you are sitting or lying down, if possible.  GET HELP IF:  · You got a tetanus shot and you have any of these problems at the injection site:    Swelling.    Very bad pain.    Redness.    Bleeding.  · You have a fever.  · A wound that was  closed breaks open.  · You notice a bad smell coming from your wound or your bandage.  · You notice something coming out of the wound, such as wood or glass.  · Medicine does not help your pain.  · You have more redness, swelling, or pain at the site of your wound.  · You have fluid, blood, or pus coming from your wound.  · You notice a change in the color of your skin near your wound.  · You need to change the bandage often because fluid, blood, or pus is coming from the wound.  · You start to have a new rash.  · You start to have numbness around the wound.  GET HELP RIGHT AWAY IF:  · You have very bad swelling around the wound.  · Your pain suddenly gets worse and is very bad.  · You notice painful lumps near the wound or on skin that is anywhere on your body.  · You have a red streak going away from your wound.  · The wound is on your hand or foot and you cannot move a finger or toe like you usually can.  · The wound is on your hand or foot and you notice that your fingers or toes look pale or bluish.     This information is not intended to replace advice given to you by your health care provider. Make sure you discuss any questions you have with your health care provider.     Document Released: 01/17/2008 Document Revised: 12/15/2014 Document Reviewed: 07/27/2014  Elsevier Interactive Patient Education ©2016 [employer]

## 2015-08-31 ENCOUNTER — Encounter: Payer: Self-pay | Admitting: Internal Medicine

## 2015-08-31 ENCOUNTER — Ambulatory Visit (INDEPENDENT_AMBULATORY_CARE_PROVIDER_SITE_OTHER): Payer: Managed Care, Other (non HMO) | Admitting: Internal Medicine

## 2015-08-31 VITALS — BP 110/70 | HR 90 | Temp 98.7°F | Resp 18 | Ht 65.0 in | Wt 151.5 lb

## 2015-08-31 DIAGNOSIS — E0789 Other specified disorders of thyroid: Secondary | ICD-10-CM

## 2015-08-31 DIAGNOSIS — F429 Obsessive-compulsive disorder, unspecified: Secondary | ICD-10-CM

## 2015-08-31 DIAGNOSIS — Z0000 Encounter for general adult medical examination without abnormal findings: Secondary | ICD-10-CM

## 2015-08-31 DIAGNOSIS — Z13220 Encounter for screening for lipoid disorders: Secondary | ICD-10-CM

## 2015-08-31 NOTE — Progress Notes (Signed)
Patient ID: Otis Dials, female   DOB: 1986/02/21, 30 y.o.   MRN: [id number]   Subjective:    Patient ID: Otis Dials, female    DOB: 1986/02/09, 30 y.o.   MRN: [id number]  HPI  Patient with past history of OCD.  She comes in today for a scheduled follow up.  Was scheduled for a physical.  Has been getting her physical at [facility].  She wants to start getting her physicals here.  Will obtain records to see when due physical.  Will hold on physical today.  Is seeing psychiatry.  Off zoloft.  On prozac now.  No chest pain or tightness.  No sob.  No acid reflux.  No abdominal pain or cramping.  Bowels stable.  LMP 08/03/15.     Past Medical History  Diagnosis Date  . History of chicken pox   . Hx: UTI (urinary tract infection)   . OCD (obsessive compulsive disorder)   . Obsessive-compulsive and related disorder    No past surgical history on file. Family History  Problem Relation Age of Onset  . Cancer - Other Maternal Grandfather     stomach cancer  . Cancer - Other Paternal Grandfather     Bone cancer   Social History   Social History  . Marital Status: Married    Spouse Name: N/A  . Number of Children: N/A  . Years of Education: N/A   Social History Main Topics  . Smoking status: Never Smoker   . Smokeless tobacco: Never Used  . Alcohol Use: No  . Drug Use: No  . Sexual Activity: Not Asked   Other Topics Concern  . None   Social History Narrative    Outpatient Encounter Prescriptions as of 08/31/2015  Medication Sig  . FLUoxetine (PROZAC) 20 MG capsule Take 80 mg by mouth daily.   . [DISCONTINUED] clonazePAM (KLONOPIN) 0.5 MG tablet TAKE 1 TABLETS BY MOUTH AT BEDTIME AS NEEDED FOR INSOMNIA  . [DISCONTINUED] sertraline (ZOLOFT) 100 MG tablet Take 250 mg by mouth daily.    No facility-administered encounter medications on file as of 08/31/2015.    Review of Systems  Constitutional: Negative for appetite change and unexpected weight change.  HENT:  Negative for congestion and sinus pressure.   Respiratory: Negative for cough, chest tightness and shortness of breath.   Cardiovascular: Negative for chest pain, palpitations and leg swelling.  Gastrointestinal: Negative for nausea, vomiting, abdominal pain and diarrhea.  Genitourinary: Negative for dysuria and difficulty urinating.  Neurological: Negative for dizziness, light-headedness and headaches.  Psychiatric/Behavioral: Negative for dysphoric mood and agitation.       Objective:    Physical Exam  Constitutional: She appears well-developed and well-nourished. No distress.  HENT:  Nose: Nose normal.  Mouth/Throat: Oropharynx is clear and moist.  Neck: Neck supple. No thyromegaly present.  Cardiovascular: Normal rate and regular rhythm.   Pulmonary/Chest: Breath sounds normal. No respiratory distress. She has no wheezes.  Abdominal: Soft. Bowel sounds are normal. There is no tenderness.  Musculoskeletal: She exhibits no edema or tenderness.  Lymphadenopathy:    She has no cervical adenopathy.  Skin: No rash noted. No erythema.  Psychiatric: She has a normal mood and affect. Her behavior is normal.    BP 110/70 mmHg  Pulse 90  Temp(Src) 98.7 F (37.1 C) (Oral)  Resp 18  Ht  (1.651 m)  Wt 151 lb 8 oz (68.72 kg)  BMI 25.21 kg/m2  SpO2 98%  LMP 08/03/2015 (Exact  Date) Wt Readings from Last 3 Encounters:  08/31/15 151 lb 8 oz (68.72 kg)  06/02/15 143 lb (64.864 kg)  02/24/15 138 lb 4 oz (62.71 kg)     Lab Results  Component Value Date   WBC 6.0 09/02/2014   HGB 14.4 09/02/2014   HCT 41.8 09/02/2014   PLT 243.0 09/02/2014   GLUCOSE 87 09/02/2014   CHOL 214* 09/02/2014   TRIG 179.0* 09/02/2014   HDL 48.10 09/02/2014   LDLCALC 130* 09/02/2014   ALT 17 09/02/2014   AST 20 09/02/2014   NA 140 09/02/2014   K 4.7 09/02/2014   CL 105 09/02/2014   CREATININE 0.61 09/02/2014   BUN 18 09/02/2014   CO2 30 09/02/2014   TSH 1.38 09/02/2014       Assessment &  Plan:   Problem List Items Addressed This Visit    Health care maintenance    Had [facility] physical at [facility].  Obtain records.  Hold physical today. Schedule for next visit.        OCD (obsessive compulsive disorder)    Followed by psychiatry.  On prozac now.  Doing well.  Feels better.  Follow.       Relevant Orders   CBC with Differential/Platelet   Comprehensive metabolic panel   TSH   Thyroid fullness    Previous thyroid ultrasound - ok.  Check tsh.        Other Visit Diagnoses    Screening cholesterol level    -  Primary    Relevant Orders    Lipid panel        [place], MD

## 2015-08-31 NOTE — Progress Notes (Signed)
Pre-visit discussion using our clinic review tool. No additional management support is needed unless otherwise documented below in the visit note.  

## 2015-09-06 NOTE — Assessment & Plan Note (Signed)
Had last physical at [facility].  Obtain records.  Hold physical today. Schedule for next visit.

## 2015-09-06 NOTE — Assessment & Plan Note (Signed)
Followed by psychiatry.  On prozac now.  Doing well.  Feels better.  Follow.

## 2015-09-06 NOTE — Assessment & Plan Note (Signed)
Previous thyroid ultrasound ok. Check tsh.  ?

## 2015-09-07 ENCOUNTER — Other Ambulatory Visit (INDEPENDENT_AMBULATORY_CARE_PROVIDER_SITE_OTHER): Payer: Managed Care, Other (non HMO)

## 2015-09-07 DIAGNOSIS — Z13220 Encounter for screening for lipoid disorders: Secondary | ICD-10-CM | POA: Diagnosis not present

## 2015-09-07 DIAGNOSIS — R7989 Other specified abnormal findings of blood chemistry: Secondary | ICD-10-CM | POA: Diagnosis not present

## 2015-09-07 DIAGNOSIS — F429 Obsessive-compulsive disorder, unspecified: Secondary | ICD-10-CM

## 2015-09-07 LAB — CBC WITH DIFFERENTIAL/PLATELET
BASOS ABS: 0 10*3/uL (ref 0.0–0.1)
Basophils Relative: 0.4 % (ref 0.0–3.0)
EOS ABS: 0.2 10*3/uL (ref 0.0–0.7)
EOS PCT: 3.1 % (ref 0.0–5.0)
HCT: 41.5 % (ref 36.0–46.0)
HEMOGLOBIN: 14 g/dL (ref 12.0–15.0)
Lymphocytes Relative: 31 % (ref 12.0–46.0)
Lymphs Abs: 2.1 10*3/uL (ref 0.7–4.0)
MCHC: 33.8 g/dL (ref 30.0–36.0)
MCV: 88.5 fl (ref 78.0–100.0)
MONO ABS: 0.4 10*3/uL (ref 0.1–1.0)
Monocytes Relative: 5.8 % (ref 3.0–12.0)
NEUTROS PCT: 59.7 % (ref 43.0–77.0)
Neutro Abs: 4 10*3/uL (ref 1.4–7.7)
Platelets: 257 10*3/uL (ref 150.0–400.0)
RBC: 4.68 Mil/uL (ref 3.87–5.11)
RDW: 13.3 % (ref 11.5–15.5)
WBC: 6.8 10*3/uL (ref 4.0–10.5)

## 2015-09-07 LAB — COMPREHENSIVE METABOLIC PANEL
ALBUMIN: 4.1 g/dL (ref 3.5–5.2)
ALK PHOS: 65 U/L (ref 39–117)
ALT: 19 U/L (ref 0–35)
AST: 19 U/L (ref 0–37)
BILIRUBIN TOTAL: 0.5 mg/dL (ref 0.2–1.2)
BUN: 14 mg/dL (ref 6–23)
CO2: 25 mEq/L (ref 19–32)
CREATININE: 0.53 mg/dL (ref 0.40–1.20)
Calcium: 8.9 mg/dL (ref 8.4–10.5)
Chloride: 105 mEq/L (ref 96–112)
GFR: 145.22 mL/min (ref >60.00)
GLUCOSE: 85 mg/dL (ref 70–99)
Potassium: 4.2 mEq/L (ref 3.5–5.1)
SODIUM: 138 meq/L (ref 135–145)
TOTAL PROTEIN: 7 g/dL (ref 6.0–8.3)

## 2015-09-07 LAB — LIPID PANEL
CHOL/HDL RATIO: 5
Cholesterol: 225 mg/dL — ABNORMAL HIGH (ref 0–200)
HDL: 44.9 mg/dL (ref >39.00)
NONHDL: 179.9
Triglycerides: 256 mg/dL — ABNORMAL HIGH (ref 0.0–149.0)
VLDL: 51.2 mg/dL — ABNORMAL HIGH (ref 0.0–40.0)

## 2015-09-07 LAB — LDL CHOLESTEROL, DIRECT: Direct LDL: 127 mg/dL

## 2015-09-07 LAB — TSH: TSH: 1.08 u[IU]/mL (ref 0.35–4.50)

## 2015-09-08 ENCOUNTER — Encounter: Payer: Self-pay | Admitting: *Deleted

## 2015-10-05 ENCOUNTER — Ambulatory Visit
Admission: EM | Admit: 2015-10-05 | Discharge: 2015-10-05 | Disposition: A | Discharge status: Home / Self Care | Payer: Managed Care, Other (non HMO) | Attending: Family Medicine | Admitting: Family Medicine

## 2015-10-05 ENCOUNTER — Encounter: Payer: Self-pay | Admitting: *Deleted

## 2015-10-05 DIAGNOSIS — J028 Acute pharyngitis due to other specified organisms: Secondary | ICD-10-CM

## 2015-10-05 LAB — RAPID STREP SCREEN (MED CTR MEBANE ONLY): Streptococcus, Group A Screen (Direct): NEGATIVE

## 2015-10-05 MED ORDER — FEXOFENADINE-PSEUDOEPHED ER 180-240 MG PO TB24: 1.0000 | ORAL_TABLET | Freq: Every day | ORAL | Status: DC

## 2015-10-05 MED ORDER — BENZONATATE 200 MG PO CAPS: 200.0000 mg | ORAL_CAPSULE | Freq: Three times a day (TID) | ORAL | Status: DC | PRN

## 2015-10-05 NOTE — ED Provider Notes (Signed)
CSN: [id number]     Arrival date & time 10/05/15  0810 History   First MD Initiated Contact with Patient 10/05/15 [phone]    Nurses notes were reviewed. Chief Complaint  Patient presents with  . Sore Throat  . Cough  . Fever  . Generalized Body Aches    Patient reports having sore throat since Sunday. She reports initially some body aches which cleared states the fever was up to 102 she is afebrile now patient was having a cough and metallic taste in mouth. She was having the flu shot doesn't smoke and history of cancer maternal grandfather and paternal grandfather.    (Consider location/radiation/quality/duration/timing/severity/associated sxs/prior Treatment) Patient is a 30 y.o. female presenting with pharyngitis, cough, and fever. The history is provided by the patient.  Sore Throat This is a new problem. The current episode started 2 days ago. The problem occurs constantly. The problem has not changed since onset.Associated symptoms include abdominal pain. Pertinent negatives include no chest pain, no headaches and no shortness of breath. Nothing aggravates the symptoms. She has tried nothing for the symptoms.  Cough Associated symptoms: fever   Associated symptoms: no chest pain, no headaches and no shortness of breath   Fever Temp source:  Subjective Severity:  Moderate Associated symptoms: cough   Associated symptoms: no chest pain and no headaches     Past Medical History  Diagnosis Date  . History of chicken pox   . Hx: UTI (urinary tract infection)   . OCD (obsessive compulsive disorder)   . Obsessive-compulsive and related disorder    History reviewed. No pertinent past surgical history. Family History  Problem Relation Age of Onset  . Cancer - Other Maternal Grandfather     stomach cancer  . Cancer - Other Paternal Grandfather     Bone cancer   Social History  Substance Use Topics  . Smoking status: Never Smoker   . Smokeless tobacco: Never Used  . Alcohol  Use: No   OB History    No data available     Review of Systems  Constitutional: Positive for fever.  Respiratory: Positive for cough. Negative for shortness of breath.   Cardiovascular: Negative for chest pain.  Gastrointestinal: Positive for abdominal pain.  Neurological: Negative for headaches.  All other systems reviewed and are negative.   Allergies  Sulfa antibiotics  Home Medications   Prior to Admission medications   Medication Sig Start Date End Date Taking? Authorizing Provider  FLUoxetine (PROZAC) 20 MG capsule Take 80 mg by mouth daily.  08/30/15  Yes Historical Provider, MD  ibuprofen (ADVIL,MOTRIN) 800 MG tablet Take 800 mg by mouth every 8 (eight) hours as needed.   Yes Historical Provider, MD  benzonatate (TESSALON) 200 MG capsule Take 1 capsule (200 mg total) by mouth 3 (three) times daily as needed for cough. 10/05/15   Hassan Rowan, MD  fexofenadine-pseudoephedrine (ALLEGRA-D ALLERGY & CONGESTION) 180-240 MG 24 hr tablet Take 1 tablet by mouth daily. 10/05/15   Hassan Rowan, MD   Meds Ordered and Administered this Visit  Medications - No data to display  BP 119/76 mmHg  Pulse 93  Temp(Src) 99.6 F (37.6 C) (Oral)  Resp 16  Ht  (1.676 m)  Wt 148 lb (67.132 kg)  BMI 23.90 kg/m2  SpO2 100%  LMP 09/06/2015 (Exact Date) No data found.   Physical Exam  Constitutional: She appears well-developed and well-nourished.  HENT:  Head: Normocephalic and atraumatic.  Right Ear: Hearing, tympanic membrane, external  ear and ear canal normal.  Left Ear: Hearing, tympanic membrane, external ear and ear canal normal.  Nose: Mucosal edema and rhinorrhea present.  Mouth/Throat: Normal dentition. No dental caries. Posterior oropharyngeal erythema present.  Eyes: Pupils are equal, round, and reactive to light.  Vitals reviewed.   ED Course  Procedures (including critical care time)  Labs Review Labs Reviewed  RAPID STREP SCREEN (NOT AT [facility])  CULTURE, GROUP A  STREP [facility])    Imaging Review No results found.   Visual Acuity Review  Right Eye Distance:   Left Eye Distance:   Bilateral Distance:    Right Eye Near:   Left Eye Near:    Bilateral Near:      Results for orders placed or performed during the hospital encounter of 10/05/15  Rapid strep screen  Result Value Ref Range   Streptococcus, Group A Screen (Direct) NEGATIVE NEGATIVE     MDM   1. Acute pharyngitis due to other specified organisms      I have informed patient that as ore throat for two days w/o strep positive test is probably viral. If culture comes back positive she would be placed on an antibiotic  or if she is not better in three days to come back for reevaluation.  She decline work note. Place on [employer] and [employer].  Note: This dictation was prepared with Dragon dictation along with smaller phrase technology. Any transcriptional errors that result from this process are unintentional.  Hassan Rowan, MD 10/05/15 [phone]

## 2015-10-05 NOTE — ED Notes (Signed)
Sore throat, fever, productive cough- white/clear, and body aches x3 days.

## 2015-10-05 NOTE — Discharge Instructions (Signed)
Pharyngitis °Pharyngitis is a sore throat (pharynx). There is redness, pain, and swelling of your throat. °HOME CARE  °· Drink enough fluids to keep your pee (urine) clear or pale yellow. °· Only take medicine as told by your doctor. °· You may get sick again if you do not take medicine as told. Finish your medicines, even if you start to feel better. °· Do not take aspirin. °· Rest. °· Rinse your mouth (gargle) with salt water (½ tsp of salt per 1 qt of water) every 1-2 hours. This will help the pain. °· If you are not at risk for choking, you can suck on hard candy or sore throat lozenges. °GET HELP IF: °· You have large, tender lumps on your neck. °· You have a rash. °· You cough up green, yellow-brown, or bloody spit. °GET HELP RIGHT AWAY IF:  °· You have a stiff neck. °· You drool or cannot swallow liquids. °· You throw up (vomit) or are not able to keep medicine or liquids down. °· You have very bad pain that does not go away with medicine. °· You have problems breathing (not from a stuffy nose). °MAKE SURE YOU:  °· Understand these instructions. °· Will watch your condition. °· Will get help right away if you are not doing well or get worse. °  °This information is not intended to replace advice given to you by your health care provider. Make sure you discuss any questions you have with your health care provider. °  °Document Released: 01/17/2008 Document Revised: 05/21/2013 Document Reviewed: 04/07/2013 °Elsevier Interactive Patient Education ©2016 Elsevier Inc. ° °Sore Throat °A sore throat is a painful, burning, sore, or scratchy feeling of the throat. There may be pain or tenderness when swallowing or talking. You may have other symptoms with a sore throat. These include coughing, sneezing, fever, or a swollen neck. A sore throat is often the first sign of another sickness. These sicknesses may include a cold, flu, strep throat, or an infection called mono. Most sore throats go away without medical  treatment.  °HOME CARE  °· Only take medicine as told by your doctor. °· Drink enough fluids to keep your pee (urine) clear or pale yellow. °· Rest as needed. °· Try using throat sprays, lozenges, or suck on hard candy (if older than 4 years or as told). °· Sip warm liquids, such as broth, herbal tea, or warm water with honey. Try sucking on frozen ice pops or drinking cold liquids. °· Rinse the mouth (gargle) with salt water. Mix 1 teaspoon salt with 8 ounces of water. °· Do not smoke. Avoid being around others when they are smoking. °· Put a humidifier in your bedroom at night to moisten the air. You can also turn on a hot shower and sit in the bathroom for 5-10 minutes. Be sure the bathroom door is closed. °GET HELP RIGHT AWAY IF:  °· You have trouble breathing. °· You cannot swallow fluids, soft foods, or your spit (saliva). °· You have more puffiness (swelling) in the throat. °· Your sore throat does not get better in 7 days. °· You feel sick to your stomach (nauseous) and throw up (vomit). °· You have a fever or lasting symptoms for more than 2-3 days. °· You have a fever and your symptoms suddenly get worse. °MAKE SURE YOU:  °· Understand these instructions. °· Will watch your condition. °· Will get help right away if you are not doing well or get worse. °  °  This information is not intended to replace advice given to you by your health care provider. Make sure you discuss any questions you have with your health care provider. °  °Document Released: 05/09/2008 Document Revised: 04/24/2012 Document Reviewed: 04/07/2012 °Elsevier Interactive Patient Education ©2016 Elsevier Inc. ° °

## 2015-10-05 NOTE — ED Notes (Signed)
Pt declined flu test

## 2015-10-07 LAB — CULTURE, GROUP A STREP (THRC)

## 2015-12-07 ENCOUNTER — Encounter: Payer: Managed Care, Other (non HMO) | Admitting: Internal Medicine

## 2015-12-07 DIAGNOSIS — Z0289 Encounter for other administrative examinations: Secondary | ICD-10-CM

## 2016-05-25 ENCOUNTER — Encounter: Payer: Self-pay | Admitting: Family Medicine

## 2016-05-25 ENCOUNTER — Ambulatory Visit (INDEPENDENT_AMBULATORY_CARE_PROVIDER_SITE_OTHER): Payer: Managed Care, Other (non HMO) | Admitting: Family Medicine

## 2016-05-25 VITALS — BP 120/80 | HR 89 | Wt 159.0 lb

## 2016-05-25 DIAGNOSIS — M67971 Unspecified disorder of synovium and tendon, right ankle and foot: Secondary | ICD-10-CM

## 2016-05-25 NOTE — Progress Notes (Signed)
   Subjective:    Patient ID: Becky Mccoy, female    DOB: August 02, 1986, 30 y.o.   MRN: [id number]  HPI  This is a 30 yo female, accompanied by her young daughter, who presents today with right sided heel pain for 2 weeks. She does not recall any injury or overuse prior to pain starting. She twisted her ankle stepping in a hole last week, but was having pain prior to this. Pain is throbbing, like a knife. Worse when she is on her feet. 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 She has taken ibuprofen 800 mg several times with some improvement in pain, never complete resolution. Pain does not radiate into foot, but will radiate up into lower leg.    Past Medical History:  Diagnosis Date  . History of chicken pox   . Hx: UTI (urinary tract infection)   . Obsessive-compulsive and related disorder   . OCD (obsessive compulsive disorder)    No past surgical history on file. Family History  Problem Relation Age of Onset  . Cancer - Other Maternal Grandfather     stomach cancer  . Cancer - Other Paternal Grandfather     Bone cancer   Social History  Substance Use Topics  . Smoking status: Never Smoker  . Smokeless tobacco: Never Used  . Alcohol use No     Review of Systems Per HPI    Objective:   Physical Exam  Constitutional: She is oriented to person, place, and time. She appears well-developed and well-nourished. No distress.  HENT:  Head: Normocephalic and atraumatic.  Cardiovascular: Normal rate.   Pulmonary/Chest: Effort normal.  Musculoskeletal:       Feet:  Neurological: She is alert and oriented to person, place, and time.  Skin: Skin is warm and dry. She is not diaphoretic.  Psychiatric: She has a normal mood and affect. Her behavior is normal. Judgment and thought content normal.  Vitals reviewed.     BP 120/80   Pulse 89   Wt 159 lb (72.1 kg)   LMP 05/02/2016   SpO2 98%   BMI 25.66 kg/m  Wt Readings from Last 3 Encounters:  05/25/16 159 lb  (72.1 kg)  10/05/15 148 lb (67.1 kg)  08/31/15 151 lb 8 oz (68.7 kg)       Assessment & Plan:  1. Achilles tendon disorder, right - not sure if a ganglia, but will refer to podiatry - continue ibuprofen, ice, prn - Ambulatory referral to Podiatry   Becky Reeeborah Caidence Higashi, FNP-BC  [facility], [facility]  05/25/2016 3:56 PM

## 2016-05-25 NOTE — Patient Instructions (Signed)
Continue ibuprofen twice a day as needed Ice as needed Try to avoid shoes that worsen symptoms

## 2016-05-30 ENCOUNTER — Encounter: Payer: Self-pay | Admitting: Podiatry

## 2016-05-30 ENCOUNTER — Ambulatory Visit (INDEPENDENT_AMBULATORY_CARE_PROVIDER_SITE_OTHER): Payer: Managed Care, Other (non HMO) | Admitting: Podiatry

## 2016-05-30 ENCOUNTER — Ambulatory Visit (INDEPENDENT_AMBULATORY_CARE_PROVIDER_SITE_OTHER): Payer: Managed Care, Other (non HMO)

## 2016-05-30 VITALS — BP 122/87 | HR 88 | Resp 16

## 2016-05-30 DIAGNOSIS — M79671 Pain in right foot: Secondary | ICD-10-CM

## 2016-05-30 DIAGNOSIS — M7661 Achilles tendinitis, right leg: Secondary | ICD-10-CM | POA: Diagnosis not present

## 2016-05-30 MED ORDER — MELOXICAM 15 MG PO TABS: 15.0000 mg | ORAL_TABLET | Freq: Every day | ORAL | 1 refills | Status: AC

## 2016-05-30 NOTE — Progress Notes (Signed)
   Subjective:    Patient ID: Becky Mccoy, female    DOB: 02/27/1986, 30 y.o.   MRN: [id number]  HPI    Review of Systems  All other systems reviewed and are negative.      Objective:   Physical Exam        Assessment & Plan:

## 2016-05-30 NOTE — Patient Instructions (Signed)
Achilles Tendinitis Achilles tendinitis is inflammation of the tough, cord-like band that attaches the lower muscles of your leg to your heel (Achilles tendon). It is usually caused by overusing the tendon and joint involved.  CAUSES Achilles tendinitis can happen because of:  A sudden increase in exercise or activity (such as running).  Doing the same exercises or activities (such as jumping) over and over.  Not warming up calf muscles before exercising.  Exercising in shoes that are worn out or not made for exercise.  Having arthritis or a bone growth on the back of the heel bone. This can rub against the tendon and hurt the tendon. SIGNS AND SYMPTOMS The most common symptoms are:  Pain in the back of the leg, just above the heel. The pain usually gets worse with exercise and better with rest.  Stiffness or soreness in the back of the leg, especially in the morning.  Swelling of the skin over the Achilles tendon.  Trouble standing on tiptoe. Sometimes, an Achilles tendon tears (ruptures). Symptoms of an Achilles tendon rupture can include:  Sudden, severe pain in the back of the leg.  Trouble putting weight on the foot or walking normally. DIAGNOSIS Achilles tendinitis will be diagnosed based on symptoms and a physical examination. An X-ray may be done to check if another condition is causing your symptoms. An MRI may be ordered if your health care provider suspects you may have completely torn your tendon, which is called an Achilles tendon rupture.  TREATMENT  Achilles tendinitis usually gets better over time. It can take weeks to months to heal completely. Treatment focuses on treating the symptoms and helping the injury heal. HOME CARE INSTRUCTIONS   Rest your Achilles tendon and avoid activities that cause pain.  Apply ice to the injured area:  Put ice in a plastic bag.  Place a towel between your skin and the bag.  Leave the ice on for 20 minutes, 2-3 times a  day  Try to avoid using the tendon (other than gentle range of motion) while the tendon is painful. Do not resume use until instructed by your health care provider. Then begin use gradually. Do not increase use to the point of pain. If pain does develop, decrease use and continue the above measures. Gradually increase activities that do not cause discomfort until you achieve normal use.  Do exercises to make your calf muscles stronger and more flexible. Your health care provider or physical therapist can recommend exercises for you to do.  Wrap your ankle with an elastic bandage or other wrap. This can help keep your tendon from moving too much. Your health care provider will show you how to wrap your ankle correctly.  Only take over-the-counter or prescription medicines for pain, discomfort, or fever as directed by your health care provider. SEEK MEDICAL CARE IF:   Your pain and swelling increase or pain is uncontrolled with medicines.  You develop new, unexplained symptoms or your symptoms get worse.  You are unable to move your toes or foot.  You develop warmth and swelling in your foot.  You have an unexplained temperature. MAKE SURE YOU:   Understand these instructions.  Will watch your condition.  Will get help right away if you are not doing well or get worse.   This information is not intended to replace advice given to you by your health care provider. Make sure you discuss any questions you have with your health care provider.   Document Released:   05/10/2005 Document Revised: 08/21/2014 Document Reviewed: 03/12/2013 Elsevier Interactive Patient Education 2016 Elsevier Inc.  

## 2016-06-01 MED ORDER — NONFORMULARY OR COMPOUNDED ITEM: 1.0000 g | Freq: Four times a day (QID) | 2 refills | Status: DC

## 2016-06-04 MED ORDER — BETAMETHASONE SOD PHOS & ACET 6 (3-3) MG/ML IJ SUSP
3.0000 mg | Freq: Once | INTRAMUSCULAR | Status: DC
Start: 2016-06-04 — End: 2019-06-09

## 2016-06-04 NOTE — Progress Notes (Signed)
Patient ID: Becky Mccoy, female   DOB: 1985/09/02, 30 y.o.   MRN: [id number] Subjective: Patient presents today for pain and tenderness in the right foot. Patient states the foot pain has been hurting for 3 weeks now. Patient presents today for further treatment and evaluation  Objective: Physical Exam General: The patient is alert and oriented x3 in no acute distress.  Dermatology: Skin is warm, dry and supple bilateral lower extremities. Negative for open lesions or macerations bilateral.   Vascular: Dorsalis Pedis and Posterior Tibial pulses palpable bilateral.  Capillary fill time is immediate to all digits.  Neurological: Epicritic and protective threshold intact bilateral.   Musculoskeletal: Tenderness to palpation at the lateral aspect of the posterior heel. All other joints range of motion within normal limits bilateral. Strength 5/5 in all groups bilateral.   Radiographic exam: Normal osseous mineralization. Joint spaces preserved. No fracture/dislocation/boney destruction.   Assessment: 1. Plantar fasciitis right - lateral band 2. Pain in right foot and ankle  Plan of Care:  1. Patient evaluated. Xrays reviewed.   2. Injection of 0.5cc Celestone soluspan injected into the lateral band of the posterior heel.  3. Instructed patient regarding therapies and modalities at home to alleviate symptoms.  4. Compression anklet with silicone heel pad  5. Rx for anti-inflammatory pain cream dispensed through [facility]. 6. Return to clinic in 4 weeks.

## 2016-06-09 ENCOUNTER — Ambulatory Visit: Payer: Self-pay | Admitting: Podiatry

## 2016-06-27 ENCOUNTER — Ambulatory Visit: Payer: Managed Care, Other (non HMO) | Admitting: Podiatry

## 2016-08-03 ENCOUNTER — Ambulatory Visit
Admission: RE | Admit: 2016-08-03 | Discharge: 2016-08-03 | Disposition: A | Discharge status: Home / Self Care | Payer: Managed Care, Other (non HMO) | Source: Ambulatory Visit | Attending: Family Medicine | Admitting: Family Medicine

## 2016-08-03 ENCOUNTER — Telehealth: Payer: Self-pay | Admitting: Internal Medicine

## 2016-08-03 ENCOUNTER — Ambulatory Visit
Admission: EM | Admit: 2016-08-03 | Discharge: 2016-08-03 | Disposition: A | Discharge status: Home / Self Care | Payer: Managed Care, Other (non HMO) | Attending: Family Medicine | Admitting: Family Medicine

## 2016-08-03 DIAGNOSIS — R1011 Right upper quadrant pain: Secondary | ICD-10-CM

## 2016-08-03 LAB — AMYLASE: AMYLASE: 73 U/L (ref 28–100)

## 2016-08-03 LAB — CBC WITH DIFFERENTIAL/PLATELET
BASOS ABS: 0 10*3/uL (ref 0–0.1)
BASOS PCT: 0 %
EOS ABS: 0.2 10*3/uL (ref 0–0.7)
EOS PCT: 2 %
HCT: 43.5 % (ref 35.0–47.0)
Hemoglobin: 14.9 g/dL (ref 12.0–16.0)
LYMPHS PCT: 20 %
Lymphs Abs: 2.6 10*3/uL (ref 1.0–3.6)
MCH: 30.1 pg (ref 26.0–34.0)
MCHC: 34.3 g/dL (ref 32.0–36.0)
MCV: 87.8 fL (ref 80.0–100.0)
Monocytes Absolute: 0.7 10*3/uL (ref 0.2–0.9)
Monocytes Relative: 5 %
Neutro Abs: 9.3 10*3/uL — ABNORMAL HIGH (ref 1.4–6.5)
Neutrophils Relative %: 73 %
PLATELETS: 278 10*3/uL (ref 150–440)
RBC: 4.95 MIL/uL (ref 3.80–5.20)
RDW: 12.9 % (ref 11.5–14.5)
WBC: 12.8 10*3/uL — AB (ref 3.6–11.0)

## 2016-08-03 LAB — COMPREHENSIVE METABOLIC PANEL
ALBUMIN: 4.6 g/dL (ref 3.5–5.0)
ALT: 18 U/L (ref 14–54)
ANION GAP: 8 (ref 5–15)
AST: 22 U/L (ref 15–41)
Alkaline Phosphatase: 72 U/L (ref 38–126)
BUN: 12 mg/dL (ref 6–20)
CHLORIDE: 103 mmol/L (ref 101–111)
CO2: 26 mmol/L (ref 22–32)
Calcium: 9.6 mg/dL (ref 8.9–10.3)
Creatinine, Ser: 0.52 mg/dL (ref 0.44–1.00)
GFR calc non Af Amer: >60 mL/min (ref >60)
GLUCOSE: 88 mg/dL (ref 65–99)
POTASSIUM: 3.7 mmol/L (ref 3.5–5.1)
SODIUM: 137 mmol/L (ref 135–145)
Total Bilirubin: 0.5 mg/dL (ref 0.3–1.2)
Total Protein: 8 g/dL (ref 6.5–8.1)

## 2016-08-03 LAB — LIPASE, BLOOD: LIPASE: 23 U/L (ref 11–51)

## 2016-08-03 MED ORDER — SUCRALFATE 1 G PO TABS
2.0000 g | ORAL_TABLET | Freq: Two times a day (BID) | ORAL | 0 refills | Status: DC
Start: 2016-08-03 — End: 2016-11-28

## 2016-08-03 MED ORDER — ONDANSETRON 8 MG PO TBDP
8.0000 mg | ORAL_TABLET | Freq: Once | ORAL | Status: AC
  Administered 2016-08-03: 8 mg via ORAL

## 2016-08-03 MED ORDER — ONDANSETRON 8 MG PO TBDP: 8.0000 mg | ORAL_TABLET | Freq: Three times a day (TID) | ORAL | 0 refills | Status: DC | PRN

## 2016-08-03 NOTE — ED Notes (Signed)
Scheduled appt for US for today at 4:00pm

## 2016-08-03 NOTE — Telephone Encounter (Signed)
Noted, patient advised to go to urgent care as office has no appointments

## 2016-08-03 NOTE — Telephone Encounter (Signed)
Pt called c/o stomach pains and chest pains, she notices it more after eating and it only hurts for a brief period. Pt also states that she is burping a lot. Sent call to [facility] Triage.

## 2016-08-03 NOTE — Telephone Encounter (Signed)
Awaiting [facility]

## 2016-08-03 NOTE — Telephone Encounter (Signed)
Nurse Assessment  Nurse: Vickey SagesAtkins, RN, Jacquilin Date/Time (Eastern Time): 08/03/2016 11:22:26 AM  Confirm and document reason for call. If symptomatic, describe symptoms. ---Caller states she has epigastric pain and ABD pain. Mostly after she eats. Pain comes and goes. Making her nauseated and fatigue.  Does the patient have any new or worsening symptoms? ---Yes  Will a triage be completed? ---Yes  Related visit to physician within the last 2 weeks? ---No  Does the PT have any chronic conditions? (i.e. diabetes, asthma, etc.) ---No  Is the patient pregnant or possibly pregnant? (Ask all females between the ages of 5212-55) ---No  Is this a behavioral health or substance abuse call? ---No     Guidelines    Guideline Title Affirmed Question Affirmed Notes  Abdominal Pain - Upper [1] MILD-MODERATE pain AND [2] not relieved by antacids    Final Disposition User   See Physician within 4 Hours (or PCP triage) Vickey SagesAtkins, RN, Jacquilin    Referrals  GO TO FACILITY UNDECIDED   Disagree/Comply: Comply   Office booked. [facility]

## 2016-08-03 NOTE — Telephone Encounter (Signed)
See [facility] note. 

## 2016-08-03 NOTE — ED Provider Notes (Addendum)
[facility]    CSN: [id number] Arrival date & time: 08/03/16  1236     History   Chief Complaint Chief Complaint  Patient presents with  . Nausea    HPI Becky Mccoy is a 30 y.o. female.   Patient is here because of abdominal pain. The pain is right upper quadrant and mid epigastric area. The pain has been going on for a few days off and on but really became intense yesterday. She reports her thing she ate seemed to have disagreed with her. Starting with yesterday's meal of eggs and toast and jelly. She reports pain in the right upper quadrant. She reports that she was hoping I defecating the pain make it better she ate a lot of prunes last night and states that she had quite a excessive bowel movement today but that did not help the pain. She denies a urinary tract infection frequency either. She did eat some oatmeal about 8:00 this morning. She reports nausea but she has not actually thrown up but the nausea has been persistent since yesterday There is no history of gallstones or gallbladder disease in the immediate family. She's not had any surgeries. No medical problems include history UTI, OCD, and thyroid fullness and tenderness. She has a sulfur allergy she never smoked. No family medical history that is pertinent to today's visit.    The history is provided by the patient. No language interpreter was used.  Abdominal Pain  Pain location:  RUQ and epigastric Pain quality: aching, cramping, gnawing, pressure and shooting   Pain radiates to:  Does not radiate Pain severity:  Moderate Duration:  2 days Timing:  Constant Progression:  Worsening Chronicity:  New Context: eating   Context: not awakening from sleep, not diet changes, not previous surgeries, not recent illness, not sick contacts and not suspicious food intake   Relieved by:  Nothing Worsened by:  Eating Ineffective treatments:  None tried Associated symptoms: nausea     Past Medical History:    Diagnosis Date  . History of chicken pox   . Hx: UTI (urinary tract infection)   . Obsessive-compulsive and related disorder   . OCD (obsessive compulsive disorder)     Patient Active Problem List   Diagnosis Date Noted  . Health care maintenance 02/25/2015  . Thyroid fullness 06/07/2014  . OCD (obsessive compulsive disorder) 06/07/2014    History reviewed. No pertinent surgical history.  OB History    No data available       Home Medications    Prior to Admission medications   Medication Sig Start Date End Date Taking? Authorizing Provider  FLUoxetine (PROZAC) 20 MG tablet Take 20 mg by mouth daily. PT TAKES 80MG  ONCE A DAY   Yes Historical Provider, MD  ibuprofen (ADVIL,MOTRIN) 800 MG tablet Take 800 mg by mouth every 8 (eight) hours as needed.    Historical Provider, MD  NONFORMULARY OR COMPOUNDED ITEM Apply 1-2 g topically 4 (four) times daily. 06/01/16   Felecia Shelling, DPM  ondansetron (ZOFRAN ODT) 8 MG disintegrating tablet Take 1 tablet (8 mg total) by mouth every 8 (eight) hours as needed for nausea or vomiting. 08/03/16   Hassan Rowan, MD  sucralfate (CARAFATE) 1 g tablet Take 2 tablets (2 g total) by mouth 2 (two) times daily. An hour before eating. 08/03/16   Hassan Rowan, MD    Family History Family History  Problem Relation Age of Onset  . Cancer - Other Maternal Grandfather  stomach cancer  . Cancer - Other Paternal Grandfather     Bone cancer    Social History Social History  Substance Use Topics  . Smoking status: Never Smoker  . Smokeless tobacco: Never Used  . Alcohol use No     Allergies   Sulfa antibiotics   Review of Systems Review of Systems  HENT: Negative for congestion.   Gastrointestinal: Positive for abdominal pain and nausea.  All other systems reviewed and are negative.    Physical Exam Triage Vital Signs ED Triage Vitals  Enc Vitals Group     BP 08/03/16 1333 133/82     Pulse Rate 08/03/16 1333 85     Resp 08/03/16  1333 18     Temp 08/03/16 1333 98 F (36.7 C)     Temp src --      SpO2 08/03/16 1333 100 %     Weight 08/03/16 1334 157 lb (71.2 kg)     Height 08/03/16 1334 5\' 6"  (1.676 m)     Head Circumference --      Peak Flow --      Pain Score 08/03/16 1331 7     Pain Loc --      Pain Edu? --      Excl. in GC? --    No data found.   Updated Vital Signs BP 133/82 (BP Location: Left Arm)   Pulse 85   Temp 98 F (36.7 C)   Resp 18   Ht 5\' 6"  (1.676 m)   Wt 157 lb (71.2 kg)   LMP 07/15/2016   SpO2 100%   BMI 25.34 kg/m   Visual Acuity Right Eye Distance:   Left Eye Distance:   Bilateral Distance:    Right Eye Near:   Left Eye Near:    Bilateral Near:     Physical Exam  Constitutional: She appears well-developed.  HENT:  Head: Normocephalic and atraumatic.  Eyes: EOM are normal. Pupils are equal, round, and reactive to light.  Neck: Normal range of motion. Neck supple. No tracheal deviation present. No thyromegaly present.  Cardiovascular: Normal rate and regular rhythm.   Pulmonary/Chest: Effort normal and breath sounds normal.  Abdominal: Soft. She exhibits no distension, no pulsatile liver, no ascites and no pulsatile midline mass. There is no hepatosplenomegaly, splenomegaly or hepatomegaly. There is tenderness in the right upper quadrant. There is positive Murphy's sign. There is no CVA tenderness. No hernia.    Musculoskeletal: Normal range of motion.  Neurological: She is alert.  Skin: Skin is warm and dry.  Psychiatric: She has a normal mood and affect.  Vitals reviewed.    UC Treatments / Results  Labs (all labs ordered are listed, but only abnormal results are displayed) Labs Reviewed  CBC WITH DIFFERENTIAL/PLATELET - Abnormal; Notable for the following:       Result Value   WBC 12.8 (*)    Neutro Abs 9.3 (*)    All other components within normal limits  AMYLASE  LIPASE, BLOOD  COMPREHENSIVE METABOLIC PANEL    EKG  EKG Interpretation None        Radiology [place] Abdomen Limited Ruq  Result Date: 08/03/2016 CLINICAL DATA:  Right upper quadrant pain. EXAM: US ABDOMEN LIMITED - RIGHT UPPER QUADRANT COMPARISON:  None. FINDINGS: Gallbladder: No gallstones or wall thickening visualized. No sonographic Murphy sign noted by sonographer. Common bile duct: Diameter: Normal, 3 mm. Liver: No focal lesion identified. Within normal limits in parenchymal echogenicity. IMPRESSION: No acute process  or explanation for right upper quadrant pain. Electronically Signed   By: Jeronimo Greaves M.D.   On: 08/03/2016 17:07    Procedures Procedures (including critical care time)  Medications Ordered in UC Medications  ondansetron (ZOFRAN-ODT) disintegrating tablet 8 mg (8 mg Oral Given 08/03/16 1429)     Initial Impression / Assessment and Plan / UC Course  I have reviewed the triage vital signs and the nursing notes.  Pertinent labs & imaging results that were available during my care of the patient were reviewed by me and considered in my medical decision making (see chart for details).     Results for orders placed or performed during the hospital encounter of 08/03/16  CBC with Differential  Result Value Ref Range   WBC 12.8 (H) 3.6 - 11.0 K/uL   RBC 4.95 3.80 - 5.20 MIL/uL   Hemoglobin 14.9 12.0 - 16.0 g/dL   HCT 16.1 09.6 - 04.5 %   MCV 87.8 80.0 - 100.0 fL   MCH 30.1 26.0 - 34.0 pg   MCHC 34.3 32.0 - 36.0 g/dL   RDW 40.9 81.1 - 91.4 %   Platelets 278 150 - 440 K/uL   Neutrophils Relative % 73 %   Neutro Abs 9.3 (H) 1.4 - 6.5 K/uL   Lymphocytes Relative 20 %   Lymphs Abs 2.6 1.0 - 3.6 K/uL   Monocytes Relative 5 %   Monocytes Absolute 0.7 0.2 - 0.9 K/uL   Eosinophils Relative 2 %   Eosinophils Absolute 0.2 0 - 0.7 K/uL   Basophils Relative 0 %   Basophils Absolute 0.0 0 - 0.1 K/uL  Amylase  Result Value Ref Range   Amylase 73 28 - 100 U/L  Lipase, blood  Result Value Ref Range   Lipase 23 11 - 51 U/L  Comprehensive metabolic  panel  Result Value Ref Range   Sodium 137 135 - 145 mmol/L   Potassium 3.7 3.5 - 5.1 mmol/L   Chloride 103 101 - 111 mmol/L   CO2 26 22 - 32 mmol/L   Glucose, Bld 88 65 - 99 mg/dL   BUN 12 6 - 20 mg/dL   Creatinine, Ser 7.82 0.44 - 1.00 mg/dL   Calcium 9.6 8.9 - 95.6 mg/dL   Total Protein 8.0 6.5 - 8.1 g/dL   Albumin 4.6 3.5 - 5.0 g/dL   AST 22 15 - 41 U/L   ALT 18 14 - 54 U/L   Alkaline Phosphatase 72 38 - 126 U/L   Total Bilirubin 0.5 0.3 - 1.2 mg/dL   GFR calc non Af Amer >60 >60 mL/min   GFR calc Af Amer >60 >60 mL/min   Anion gap 8 5 - 15   Clinical Course    Concern over patient might have gallbladder disease. Will obtain lab work if lab work is negative for anything significant with try to get an ultrasound of the gallbladder today after 4 or 8 hours after eating oatmeal this morning. We'll place on Carafate Zofran 8 mg ODT tablet was given while here in the office  Final Clinical Impressions(s) / UC Diagnoses   Final diagnoses:  Abdominal pain, RUQ (right upper quadrant)    New Prescriptions Discharge Medication List as of 08/03/2016  3:34 PM    START taking these medications   Details  ondansetron (ZOFRAN ODT) 8 MG disintegrating tablet Take 1 tablet (8 mg total) by mouth every 8 (eight) hours as needed for nausea or vomiting., Starting Thu 08/03/2016, Normal  sucralfate (CARAFATE) 1 g tablet Take 2 tablets (2 g total) by mouth 2 (two) times daily. An hour before eating., Starting Thu 08/03/2016, Normal        Note: This dictation was prepared with Dragon dictation along with smaller phrase technology. Any transcriptional errors that result from this process are unintentional.    Patient was notified ultrasound report was negative for gallstones recommend she use the Carafate as prescribed Zofran for nausea if she still having trouble after Tuesday to please follow-up for PCP  Hassan RowanEugene Shareese Macha, MD 08/03/16 1535    Hassan RowanEugene Reginold Beale, MD 08/03/16 1723

## 2016-08-03 NOTE — ED Triage Notes (Signed)
Pt c/o stomach aches that come and goes, she says it is in the epigastric area. Hurts worse when she eats, and it started yesterday. She has tried tums and prunes with no relief. Her last bowel movement was this morning.

## 2016-11-24 ENCOUNTER — Telehealth: Payer: Self-pay | Admitting: Internal Medicine

## 2016-11-24 NOTE — Telephone Encounter (Signed)
Left voice mail to call back 

## 2016-11-24 NOTE — Telephone Encounter (Addendum)
Reason for call: chest pain right sided  Symptoms: right breast nipple pain x 2 days , chest pain radiating to right breast  LMP 10/24/16 no birth control except husband uses condom , denies arm pain, shortness of breath now,  Dr. Lynnette Caffey evaluated her and stated that it wasn't heart related  [place] Spaniel it was vasovagal response, after took ranitidine  began to burp feels better  BP 120/80 now , previous reading  140/100  At work until 800 pm  Medications: took rannitidine Will go to urgent care or take home pregnancy test this weekend if symptoms don't improve.  Or call back next week to schedule appointment with Dr Lorin Picket.   Verbally made Dr Lorin Picket aware of situation aware since it was after hours.

## 2016-11-24 NOTE — Telephone Encounter (Signed)
With these acute symptoms, she needs to be evaluated now to confirm etiology of symptoms.

## 2016-11-24 NOTE — Telephone Encounter (Signed)
[facility] [place] [facility] [place] TELEPHONE ADVICE RECORD [facility] Patient Name: Becky Mccoy DOB: [birth year] Initial Comment Caller states she is having chest pains Nurse Assessment Nurse: Josie Saunders RN, Erskine Squibb Date/Time Lamount Cohen Time): 11/24/2016 4:17:14 PM Confirm and document reason for call. If symptomatic, describe symptoms. ---Caller states she is having chest pains Has a sharp burning pain between both breast travels to right breast. Has been present about 1 hour. Just now easing up. Skin was clammy. Low grade temp. 99. NO S.O.B. Has had a cough on and off. Ate pizza for lunch and was nauseated with pain Does the patient have any new or worsening symptoms? ---Yes Will a triage be completed? ---Yes Related visit to physician within the last 2 weeks? ---No Does the PT have any chronic conditions? (i.e. diabetes, asthma, etc.) ---No Is the patient pregnant or possibly pregnant? (Ask all females between the ages of 33-55) ---No Is this a behavioral health or substance abuse call? ---No Guidelines Guideline Title Affirmed Question Affirmed Notes Chest Pain [1] Intermittent chest pain or "angina" AND [2] increasing in severity or frequency (Exception: pains lasting a few seconds) Final Disposition User Go to ED Now Josie Saunders, RN, Erskine Squibb Comments Does not want to go to ED. Would like a call back from MD Referrals GO TO FACILITY REFUSED Disagree/Comply: Comply

## 2016-11-24 NOTE — Telephone Encounter (Signed)
Pt called about having chest pains. Pt BP was 140/100. Pt was transferred to team health. Thank you!

## 2016-11-27 NOTE — Telephone Encounter (Signed)
Spoke with patient she didn't go to urgent care.  States she is still having right breast pain period is over due 34 days. Took home pregnancy test negative .  She would like to come to see you please advise.

## 2016-11-27 NOTE — Telephone Encounter (Signed)
See if she can come in tomorrow Tuesday 11/28/16 at 4:00.  Thanks

## 2016-11-27 NOTE — Telephone Encounter (Signed)
Please add patient to schedule she has been informed of app date and time

## 2016-11-27 NOTE — Telephone Encounter (Signed)
Patient can be reached at [phone], pt also requested a appt time and date to see Dr. Lorin Picket

## 2016-11-27 NOTE — Telephone Encounter (Signed)
Patient scheduled see below

## 2016-11-27 NOTE — Telephone Encounter (Signed)
Received 11/27/16.  Please call and check on pt.  Thanks.

## 2016-11-27 NOTE — Telephone Encounter (Signed)
The patient has been scheduled

## 2016-11-27 NOTE — Telephone Encounter (Signed)
Left voice mail to call back 

## 2016-11-28 ENCOUNTER — Encounter: Payer: Self-pay | Admitting: Internal Medicine

## 2016-11-28 ENCOUNTER — Ambulatory Visit (INDEPENDENT_AMBULATORY_CARE_PROVIDER_SITE_OTHER): Payer: Managed Care, Other (non HMO) | Admitting: Internal Medicine

## 2016-11-28 VITALS — BP 120/82 | HR 98 | Temp 98.7°F | Resp 12 | Ht 66.0 in | Wt 154.0 lb

## 2016-11-28 DIAGNOSIS — N6452 Nipple discharge: Secondary | ICD-10-CM

## 2016-11-28 DIAGNOSIS — R079 Chest pain, unspecified: Secondary | ICD-10-CM

## 2016-11-28 DIAGNOSIS — N926 Irregular menstruation, unspecified: Secondary | ICD-10-CM

## 2016-11-28 LAB — HCG, QUANTITATIVE, PREGNANCY

## 2016-11-28 LAB — PROLACTIN: Prolactin: 20.1 ng/mL

## 2016-11-28 NOTE — Progress Notes (Signed)
Patient ID: Becky Mccoy, female   DOB: 11-23-1985, 31 y.o.   MRN: [id number]   Subjective:    Patient ID: Becky Mccoy, female    DOB: 21-Nov-1985, 31 y.o.   MRN: [id number]  HPI  Patient here as a work in with concerns regarding right breast pain.  Some pain between breast and extends to right breast.  Has noticed nipple discharge since her pregnancy.  Notices when expressed from breast.  Clear discharge.  (right >left).  No palpable nodule.  On 11/24/16 - noticed some chest discomfort.  See phone note.  Started ranitidine.  States this has resolved.  Discussed continuing ranitidine.  LMP 3/13.  Denies possibility of pregnancy.     Past Medical History:  Diagnosis Date  . History of chicken pox   . Hx: UTI (urinary tract infection)   . Obsessive-compulsive and related disorder   . OCD (obsessive compulsive disorder)    History reviewed. No pertinent surgical history. Family History  Problem Relation Age of Onset  . Cancer - Other Maternal Grandfather     stomach cancer  . Cancer - Other Paternal Grandfather     Bone cancer   Social History   Social History  . Marital status: Married    Spouse name: N/A  . Number of children: N/A  . Years of education: N/A   Social History Main Topics  . Smoking status: Never Smoker  . Smokeless tobacco: Never Used  . Alcohol use No  . Drug use: No  . Sexual activity: Not Asked   Other Topics Concern  . None   Social History Narrative  . None    Outpatient Encounter Prescriptions as of 11/28/2016  Medication Sig  . FLUoxetine (PROZAC) 20 MG tablet Take 60 mg by mouth daily. PT TAKES  ONCE A DAY   . [DISCONTINUED] ibuprofen (ADVIL,MOTRIN) 800 MG tablet Take 800 mg by mouth every 8 (eight) hours as needed.  . [DISCONTINUED] NONFORMULARY OR COMPOUNDED ITEM Apply 1-2 g topically 4 (four) times daily.  . [DISCONTINUED] ondansetron (ZOFRAN ODT) 8 MG disintegrating tablet Take 1 tablet (8 mg total) by mouth every 8 (eight) hours  as needed for nausea or vomiting.  . [DISCONTINUED] sucralfate (CARAFATE) 1 g tablet Take 2 tablets (2 g total) by mouth 2 (two) times daily. An hour before eating.   Facility-Administered Encounter Medications as of 11/28/2016  Medication  . betamethasone acetate-betamethasone sodium phosphate (CELESTONE) injection 3 mg    Review of Systems  Constitutional: Negative for appetite change and unexpected weight change.  Respiratory: Negative for cough, chest tightness and shortness of breath.   Cardiovascular: Negative for chest pain and leg swelling.  Gastrointestinal: Negative for nausea and vomiting.  Skin: Negative for color change and rash.  Psychiatric/Behavioral:       Doing better on prozac.         Objective:    Physical Exam  Constitutional: She appears well-developed and well-nourished. No distress.  Neck: Neck supple. No thyromegaly present.  Cardiovascular: Normal rate and regular rhythm.   Pulmonary/Chest: Breath sounds normal. No respiratory distress. She has no wheezes.  Breasts exam:  Able to express clear discharge from both nipples.  No nipple retraction present.   Could not appreciate distinct nodules or axillary adenopathy.    Abdominal: Soft. Bowel sounds are normal. There is no tenderness.  Lymphadenopathy:    She has no cervical adenopathy.    BP 120/82 (BP Location: Left Arm, Patient Position: Sitting, Cuff Size: Normal)  Pulse 98   Temp 98.7 F (37.1 C) (Oral)   Resp 12   Ht  (1.676 m)   Wt 154 lb (69.9 kg)   LMP 10/24/2016   SpO2 99%   BMI 24.86 kg/m  Wt Readings from Last 3 Encounters:  11/28/16 154 lb (69.9 kg)  08/03/16 157 lb (71.2 kg)  05/25/16 159 lb (72.1 kg)     Lab Results  Component Value Date   WBC 12.8 (H) 08/03/2016   HGB 14.9 08/03/2016   HCT 43.5 08/03/2016   PLT 278 08/03/2016   GLUCOSE 88 08/03/2016   CHOL 225 (H) 09/07/2015   TRIG 256.0 (H) 09/07/2015   HDL 44.90 09/07/2015   LDLDIRECT 127.0 09/07/2015    LDLCALC 130 (H) 09/02/2014   ALT 18 08/03/2016   AST 22 08/03/2016   NA 137 08/03/2016   K 3.7 08/03/2016   CL 103 08/03/2016   CREATININE 0.52 08/03/2016   BUN 12 08/03/2016   CO2 26 08/03/2016   TSH 1.08 09/07/2015    US Abdomen Limited Ruq  Result Date: 08/03/2016 CLINICAL DATA:  Right upper quadrant pain. EXAM: US ABDOMEN LIMITED - RIGHT UPPER QUADRANT COMPARISON:  None. FINDINGS: Gallbladder: No gallstones or wall thickening visualized. No sonographic Murphy sign noted by sonographer. Common bile duct: Diameter: Normal, 3 mm. Liver: No focal lesion identified. Within normal limits in parenchymal echogenicity. IMPRESSION: No acute process or explanation for right upper quadrant pain. Electronically Signed   By: Jeronimo Greaves M.D.   On: 08/03/2016 17:07       Assessment & Plan:   Problem List Items Addressed This Visit    Chest pain    Resolved.  No chest pain.  Follow.       Nipple discharge - Primary    Started with breast pain as outlined.  Better now.  Nipple discharge as outlined.  Culture obtained.  Check prolactin level.  Further w/up pending results.  May need referral to surgery.        Relevant Orders   Aerobic culture (Completed)    Other Visit Diagnoses    Menstrual irregularity       Relevant Orders   Prolactin (Completed)   B-HCG Quant (Completed)       [place], MD

## 2016-11-28 NOTE — Progress Notes (Signed)
Pre-visit discussion using our clinic review tool. No additional management support is needed unless otherwise documented below in the visit note.  

## 2016-11-29 ENCOUNTER — Telehealth: Payer: Self-pay | Admitting: *Deleted

## 2016-11-29 NOTE — Telephone Encounter (Signed)
Patient has requested lab results. Pt also wanted to FYI Dr. Lorin Picket that she has had vomiting today.  Pt contact  [phone]

## 2016-11-30 NOTE — Telephone Encounter (Signed)
Patient informed see lab results note

## 2016-12-01 LAB — AEROBIC CULTURE

## 2016-12-11 ENCOUNTER — Encounter: Payer: Self-pay | Admitting: Internal Medicine

## 2016-12-11 DIAGNOSIS — N6452 Nipple discharge: Secondary | ICD-10-CM | POA: Insufficient documentation

## 2016-12-11 DIAGNOSIS — R079 Chest pain, unspecified: Secondary | ICD-10-CM | POA: Insufficient documentation

## 2016-12-11 NOTE — Assessment & Plan Note (Signed)
Started with breast pain as outlined.  Better now.  Nipple discharge as outlined.  Culture obtained.  Check prolactin level.  Further w/up pending results.  May need referral to surgery.

## 2016-12-11 NOTE — Assessment & Plan Note (Signed)
Resolved.  No chest pain.  Follow.

## 2017-08-28 ENCOUNTER — Ambulatory Visit
Admission: EM | Admit: 2017-08-28 | Discharge: 2017-08-28 | Disposition: A | Discharge status: Home / Self Care | Payer: Managed Care, Other (non HMO) | Attending: Family Medicine | Admitting: Family Medicine

## 2017-08-28 ENCOUNTER — Other Ambulatory Visit: Payer: Self-pay

## 2017-08-28 DIAGNOSIS — R05 Cough: Secondary | ICD-10-CM

## 2017-08-28 DIAGNOSIS — J029 Acute pharyngitis, unspecified: Secondary | ICD-10-CM | POA: Diagnosis not present

## 2017-08-28 LAB — RAPID STREP SCREEN (MED CTR MEBANE ONLY): Streptococcus, Group A Screen (Direct): NEGATIVE

## 2017-08-28 MED ORDER — AMOXICILLIN 500 MG PO TABS: 500.0000 mg | ORAL_TABLET | Freq: Two times a day (BID) | ORAL | 0 refills | Status: DC

## 2017-08-28 NOTE — ED Triage Notes (Addendum)
Pt with 4 days of sore throat pain. Daughter had strep throat 2-3 days ago.

## 2017-08-28 NOTE — ED Provider Notes (Signed)
[facility]    CSN: [id number] Arrival date & time: 08/28/17  1841   History   Chief Complaint Chief Complaint  Patient presents with  . Sore Throat   HPI  32 year old female presents with sore throat.  Patient reports a 4-day history of sore throat.  She has had associated cough.  Elevated temperature, T-max 100.3.  Pain is currently 5/10 in severity.  She has had a recent exposure to strep as her daughter developed this a few days ago and is currently on treatment.  No known exacerbating relieving factors.  No other associated symptoms.  No other complaints this time.  Past Medical History:  Diagnosis Date  . History of chicken pox   . Hx: UTI (urinary tract infection)   . Obsessive-compulsive and related disorder   . OCD (obsessive compulsive disorder)     Patient Active Problem List   Diagnosis Date Noted  . Nipple discharge 12/11/2016  . Chest pain 12/11/2016  . Health care maintenance 02/25/2015  . Thyroid fullness 06/07/2014  . OCD (obsessive compulsive disorder) 06/07/2014    History reviewed. No pertinent surgical history.  OB History    No data available     Home Medications    Prior to Admission medications   Medication Sig Start Date End Date Taking? Authorizing Provider  amoxicillin (AMOXIL) 500 MG tablet Take 1 tablet (500 mg total) by mouth 2 (two) times daily. 08/28/17   Tommie Samsook, Kyonna Frier G, DO  FLUoxetine (PROZAC) 20 MG tablet Take 40 mg by mouth daily. PT TAKES 80MG  ONCE A DAY     [provider]   Family History Family History  Problem Relation Age of Onset  . Healthy Mother   . Healthy Father   . Cancer - Other Maternal Grandfather        stomach cancer  . Cancer - Other Paternal Grandfather        Bone cancer   Social History Social History   Tobacco Use  . Smoking status: Never Smoker  . Smokeless tobacco: Never Used  Substance Use Topics  . Alcohol use: No    Alcohol/week: 0.0 oz  . Drug use: No      Allergies   Sulfa antibiotics   Review of Systems Review of Systems  Constitutional: Positive for fever.  HENT: Positive for sore throat.   Respiratory: Positive for cough.    Physical Exam Triage Vital Signs ED Triage Vitals  Enc Vitals Group     BP 08/28/17 1851 120/79     Pulse Rate 08/28/17 1851 84     Resp 08/28/17 1851 16     Temp 08/28/17 1851 100.3 F (37.9 C)     Temp Source 08/28/17 1851 Oral     SpO2 08/28/17 1851 100 %     Weight 08/28/17 1848 164 lb (74.4 kg)     Height 08/28/17 1848 5\' 6"  (1.676 m)     Head Circumference --      Peak Flow --      Pain Score 08/28/17 1848 5     Pain Loc --      Pain Edu? --      Excl. in GC? --    No data found.  Updated Vital Signs BP 120/79 (BP Location: Left Arm)   Pulse 84   Temp 100.3 F (37.9 C) (Oral)   Resp 16   Ht 5\' 6"  (1.676 m)   Wt 164 lb (74.4 kg)   LMP 08/06/2017  SpO2 100%   BMI 26.47 kg/m   Visual Acuity Right Eye Distance:   Left Eye Distance:   Bilateral Distance:    Right Eye Near:   Left Eye Near:    Bilateral Near:     Physical Exam  Constitutional: She is oriented to person, place, and time. She appears well-developed and well-nourished. No distress.  HENT:  Head: Normocephalic and atraumatic.  Oropharynx with mild erythema. Normal TM's.  Eyes: Conjunctivae are normal. Right eye exhibits no discharge. Left eye exhibits no discharge.  Neck: Neck supple.  Cardiovascular: Normal rate and regular rhythm.  Pulmonary/Chest: Effort normal. No respiratory distress. She has no wheezes. She has no rales.  Lymphadenopathy:    She has no cervical adenopathy.  Neurological: She is alert and oriented to person, place, and time.  Psychiatric: She has a normal mood and affect. Her behavior is normal.  Nursing note and vitals reviewed.  UC Treatments / Results  Labs (all labs ordered are listed, but only abnormal results are displayed) Labs Reviewed  RAPID STREP SCREEN (NOT AT [facility])   CULTURE, GROUP A STREP [facility])    EKG  EKG Interpretation None       Radiology No results found.  Procedures Procedures (including critical care time)  Medications Ordered in UC Medications - No data to display   Initial Impression / Assessment and Plan / UC Course  I have reviewed the triage vital signs and the nursing notes.  Pertinent labs & imaging results that were available during my care of the patient were reviewed by me and considered in my medical decision making (see chart for details).     32 year old female presents with sore throat.  Rapid strep negative.  However, she is been recently exposed to strep throat.  Given this, will treat empirically while awaiting culture.  Final Clinical Impressions(s) / UC Diagnoses   Final diagnoses:  Pharyngitis, unspecified etiology    ED Discharge Orders        Ordered    amoxicillin (AMOXIL) 500 MG tablet  2 times daily     08/28/17 1925     Controlled Substance Prescriptions [place] Controlled Substance Registry consulted? Not Applicable   Tommie Sams, DO 08/28/17 2003

## 2017-08-28 NOTE — Discharge Instructions (Signed)
We will call you about the culture.  Take care  Dr. Adriana Simasook

## 2017-09-01 LAB — CULTURE, GROUP A STREP (THRC)

## 2018-05-21 ENCOUNTER — Ambulatory Visit: Payer: Self-pay

## 2018-05-21 NOTE — Telephone Encounter (Signed)
Patient advised to go to Urgent care by [facility] Nurse Triage.

## 2018-05-21 NOTE — Telephone Encounter (Signed)
Pt. Reports she was working and became dizzy, very hot and broke out in a sweat. 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 Was driving while speaking with triage nurse and had to pull into a gas station because she became dizzy. Did states she could be pregnant. Instructed pt. Not to drive. Dizziness stopped. No availability in practice. Instructed to go to UC for evaluation.States she is 10 minutes from an UC. States she will have husband come and pick her up and take her. Concerned about her job due to having to leave.  Reason for Disposition . [1] MODERATE dizziness (e.g., interferes with normal activities) AND [2] has NOT been evaluated by physician for this  (Exception: dizziness caused by heat exposure, sudden standing, or poor fluid intake)  Answer Assessment - Initial Assessment Questions 1. DESCRIPTION: "Describe your dizziness."     Dizzy 2. LIGHTHEADED: "Do you feel lightheaded?" (e.g., somewhat faint, woozy, weak upon standing)     Felt like she was going to faint 3. VERTIGO: "Do you feel like either you or the room is spinning or tilting?" (i.e. vertigo)     A little 4. SEVERITY: "How bad is it?"  "Do you feel like you are going to faint?" "Can you stand and walk?"   - MILD - walking normally   - MODERATE - interferes with normal activities (e.g., work, school)    - SEVERE - unable to stand, requires support to walk, feels like passing out now.      Moderate 5. ONSET:  "When did the dizziness begin?"     This morning 6. AGGRAVATING FACTORS: "Does anything make it worse?" (e.g., standing, change in head position)     Not sure 7. HEART RATE: "Can you tell me your heart rate?" "How many beats in 15 seconds?"  (Note: not all patients can do this)       BP 135/93 8. CAUSE: "What do you think is causing the dizziness?"     Unsure 9. RECURRENT SYMPTOM: "Have you had dizziness before?" If so, ask: "When was the last time?" "What happened that  time?"     No 10. OTHER SYMPTOMS: "Do you have any other symptoms?" (e.g., fever, chest pain, vomiting, diarrhea, bleeding)       Nausea 11. PREGNANCY: "Is there any chance you are pregnant?" "When was your last menstrual period?"       Unsure  Protocols used: DIZZINESS [facility]

## 2018-05-23 ENCOUNTER — Ambulatory Visit (INDEPENDENT_AMBULATORY_CARE_PROVIDER_SITE_OTHER): Payer: Managed Care, Other (non HMO) | Admitting: Obstetrics & Gynecology

## 2018-05-23 ENCOUNTER — Encounter: Payer: Self-pay | Admitting: Obstetrics & Gynecology

## 2018-05-23 ENCOUNTER — Other Ambulatory Visit (HOSPITAL_COMMUNITY)
Admission: RE | Admit: 2018-05-23 | Discharge: 2018-05-23 | Disposition: A | Discharge status: Home / Self Care | Payer: Managed Care, Other (non HMO) | Source: Ambulatory Visit | Attending: Obstetrics & Gynecology | Admitting: Obstetrics & Gynecology

## 2018-05-23 VITALS — BP 120/80 | Ht 66.5 in | Wt 166.0 lb

## 2018-05-23 DIAGNOSIS — Z124 Encounter for screening for malignant neoplasm of cervix: Secondary | ICD-10-CM | POA: Insufficient documentation

## 2018-05-23 DIAGNOSIS — N938 Other specified abnormal uterine and vaginal bleeding: Secondary | ICD-10-CM | POA: Diagnosis not present

## 2018-05-23 DIAGNOSIS — N926 Irregular menstruation, unspecified: Secondary | ICD-10-CM

## 2018-05-23 NOTE — Progress Notes (Signed)
HPI:      Becky Mccoy is a 32 y.o. [id number] who LMP was Patient's last menstrual period was 04/13/2018., presents today for a problem visit.  She complains of missing aperiod by 40 days (normal 30 day cycles) but then started bleeding last night.  SHe has had nausea and crampy abdominal pain for the last week.  Not on hormonal contraception, uses condoms and would be OK w pregnancy.   Last PAP 5 years ago Prior NSVD x2 (5,14)  PMHx: She  has a past medical history of History of chicken pox, UTI (urinary tract infection), Obsessive-compulsive and related disorder, and OCD (obsessive compulsive disorder). Also,  has a past surgical history that includes Wisdom tooth extraction., family history includes Cancer - Other in her maternal grandfather and paternal grandfather; Healthy in her father and mother.,  reports that she has never smoked. She has never used smokeless tobacco. She reports that she does not drink alcohol or use drugs.  She  Current Outpatient Medications:  .  FLUoxetine (PROZAC) 20 MG tablet, Take 40 mg by mouth daily. PT TAKES 80MG  ONCE A DAY , Disp: , Rfl:  .  amoxicillin (AMOXIL) 500 MG tablet, Take 1 tablet (500 mg total) by mouth 2 (two) times daily. (Patient not taking: Reported on 05/23/2018), Disp: 20 tablet, Rfl: 0  Current Facility-Administered Medications:  .  betamethasone acetate-betamethasone sodium phosphate (CELESTONE) injection 3 mg, 3 mg, Intramuscular, Once, Felecia Shelling, DPM  Also, is allergic to sulfa antibiotics.  Review of Systems  Constitutional: Negative for chills, fever and malaise/fatigue.  HENT: Negative for congestion, sinus pain and sore throat.   Eyes: Negative for blurred vision and pain.  Respiratory: Negative for cough and wheezing.   Cardiovascular: Negative for chest pain and leg swelling.  Gastrointestinal: Negative for abdominal pain, constipation, diarrhea, heartburn, nausea and vomiting.  Genitourinary: Negative for dysuria,  frequency, hematuria and urgency.  Musculoskeletal: Negative for back pain, joint pain, myalgias and neck pain.  Skin: Negative for itching and rash.  Neurological: Negative for dizziness, tremors and weakness.  Endo/Heme/Allergies: Does not bruise/bleed easily.  Psychiatric/Behavioral: Negative for depression. The patient is not nervous/anxious and does not have insomnia.     Objective: BP 120/80   Ht 5' 6.5" (1.689 m)   Wt 166 lb (75.3 kg)   LMP 04/13/2018   BMI 26.39 kg/m  Physical Exam  Constitutional: She is oriented to person, place, and time. She appears well-developed and well-nourished. No distress.  Genitourinary: Rectum normal, vagina normal and uterus normal. Pelvic exam was performed with patient supine. There is no rash or lesion on the right labia. There is no rash or lesion on the left labia. Vagina exhibits no lesion. No bleeding in the vagina. Right adnexum does not display mass and does not display tenderness. Left adnexum does not display mass and does not display tenderness. Cervix does not exhibit motion tenderness, lesion, friability or polyp.   Uterus is mobile and midaxial. Uterus is not enlarged or exhibiting a mass.  HENT:  Head: Normocephalic and atraumatic. Head is without laceration.  Right Ear: Hearing normal.  Left Ear: Hearing normal.  Nose: No epistaxis.  No foreign bodies.  Mouth/Throat: Uvula is midline, oropharynx is clear and moist and mucous membranes are normal.  Eyes: Pupils are equal, round, and reactive to light.  Neck: Normal range of motion. Neck supple. No thyromegaly present.  Cardiovascular: Normal rate and regular rhythm. Exam reveals no gallop and no friction rub.  No murmur heard. Pulmonary/Chest: Effort normal and breath sounds normal. No respiratory distress. She has no wheezes. Right breast exhibits no mass, no skin change and no tenderness. Left breast exhibits no mass, no skin change and no tenderness.  Abdominal: Soft. Bowel  sounds are normal. She exhibits no distension. There is no tenderness. There is no rebound.  Musculoskeletal: Normal range of motion.  Neurological: She is alert and oriented to person, place, and time. No cranial nerve deficit.  Skin: Skin is warm and dry.  Psychiatric: She has a normal mood and affect. Judgment normal.  Vitals reviewed.  ASSESSMENT/PLAN:  dysfunctional uterine bleeding   Missed period    -  Primary, DUB vs pregnancy/miscarriage (uCG neg) vs other etiology discussed   Relevant Orders   US PELVIS TRANSVANGINAL NON-OB (TV ONLY)   Screening for cervical cancer       Relevant Orders   Cytology - PAP  Consider hormone control if period become more inconsistent or disruptive  PAP as is due  Becky Major, MD, Becky Mccoy Ob/Gyn, [facility] 05/23/2018  11:06 AM

## 2018-05-23 NOTE — Patient Instructions (Signed)
Dysfunctional Uterine Bleeding °Dysfunctional uterine bleeding is abnormal bleeding from the uterus. Dysfunctional uterine bleeding includes: °· A period that comes earlier or later than usual. °· A period that is lighter, heavier, or has blood clots. °· Bleeding between periods. °· Skipping one or more periods. °· Bleeding after sexual intercourse. °· Bleeding after menopause. ° °Follow these instructions at home: °Pay attention to any changes in your symptoms. Follow these instructions to help with your condition: °Eating and drinking °· Eat well-balanced meals. Include foods that are high in iron, such as liver, meat, shellfish, green leafy vegetables, and eggs. °· If you become constipated: °? Drink plenty of water. °? Eat fruits and vegetables that are high in water and fiber, such as spinach, carrots, raspberries, apples, and mango. °Medicines °· Take over-the-counter and prescription medicines only as told by your health care provider. °· Do not change medicines without talking with your health care provider. °· Aspirin or medicines that contain aspirin may make the bleeding worse. Do not take those medicines: °? During the week before your period. °? During your period. °· If you were prescribed iron pills, take them as told by your health care provider. Iron pills help to replace iron that your body loses because of this condition. °Activity °· If you need to change your sanitary pad or tampon more than one time every 2 hours: °? Lie in bed with your feet raised (elevated). °? Place a cold pack on your lower abdomen. °? Rest as much as possible until the bleeding stops or slows down. °· Do not try to lose weight until the bleeding has stopped and your blood iron level is back to normal. °Other Instructions °· For two months, write down: °? When your period starts. °? When your period ends. °? When any abnormal bleeding occurs. °? What problems you notice. °· Keep all follow up visits as told by your health  care provider. This is important. °Contact a health care provider if: °· You get light-headed or weak. °· You have nausea and vomiting. °· You cannot eat or drink without vomiting. °· You feel dizzy or have diarrhea while you are taking medicines. °· You are taking birth control pills or hormones, and you want to change them or stop taking them. °Get help right away if: °· You develop a fever or chills. °· You need to change your sanitary pad or tampon more than one time per hour. °· Your bleeding becomes heavier, or your flow contains clots more often. °· You develop pain in your abdomen. °· You lose consciousness. °· You develop a rash. °This information is not intended to replace advice given to you by your health care provider. Make sure you discuss any questions you have with your health care provider. °Document Released: 07/28/2000 Document Revised: 01/06/2016 Document Reviewed: 10/26/2014 °Elsevier Interactive Patient Education © 2018 Elsevier Inc. ° °

## 2018-05-24 LAB — CYTOLOGY - PAP: Diagnosis: NEGATIVE

## 2018-06-04 ENCOUNTER — Encounter: Payer: Self-pay | Admitting: Obstetrics & Gynecology

## 2018-06-04 ENCOUNTER — Ambulatory Visit (INDEPENDENT_AMBULATORY_CARE_PROVIDER_SITE_OTHER): Payer: Managed Care, Other (non HMO)

## 2018-06-04 ENCOUNTER — Ambulatory Visit (INDEPENDENT_AMBULATORY_CARE_PROVIDER_SITE_OTHER): Payer: Managed Care, Other (non HMO) | Admitting: Obstetrics & Gynecology

## 2018-06-04 VITALS — BP 120/80 | Ht 66.0 in | Wt 166.0 lb

## 2018-06-04 DIAGNOSIS — N926 Irregular menstruation, unspecified: Secondary | ICD-10-CM

## 2018-06-04 DIAGNOSIS — N8302 Follicular cyst of left ovary: Secondary | ICD-10-CM | POA: Diagnosis not present

## 2018-06-04 DIAGNOSIS — N939 Abnormal uterine and vaginal bleeding, unspecified: Secondary | ICD-10-CM

## 2018-06-04 MED ORDER — ETONOGESTREL-ETHINYL ESTRADIOL 0.12-0.015 MG/24HR VA RING: VAGINAL_RING | VAGINAL | 11 refills | Status: DC

## 2018-06-04 NOTE — Progress Notes (Signed)
  HPI: Pt has been having some irreg bleeding w ;ate and then prolonged period.  No recent hormones.  Condoms for BC.  Ultrasound demonstrates no masses seen These findings are Pelvis normal  PMHx: She  has a past medical history of History of chicken pox, UTI (urinary tract infection), Obsessive-compulsive and related disorder, and OCD (obsessive compulsive disorder). Also,  has a past surgical history that includes Wisdom tooth extraction., family history includes Cancer - Other in her maternal grandfather and paternal grandfather; Healthy in her father and mother.,  reports that she has never smoked. She has never used smokeless tobacco. She reports that she does not drink alcohol or use drugs.  She has a current medication list which includes the following prescription(s): amoxicillin and fluoxetine, and the following Facility-Administered Medications: betamethasone acetate-betamethasone sodium phosphate. Also, is allergic to sulfa antibiotics.  Review of Systems  All other systems reviewed and are negative.  Objective: BP 120/80   Ht 5\' 6"  (1.676 m)   Wt 166 lb (75.3 kg)   LMP 05/22/2018   BMI 26.79 kg/m   Physical examination Constitutional NAD, Conversant  Skin No rashes, lesions or ulceration.   Extremities: Moves all appropriately.  Normal ROM for age. No lymphadenopathy.  Neuro: Grossly intact  Psych: Oriented to PPT.  Normal mood. Normal affect.   Assessment:  Irregular bleeding Desires period control and BC, so will start Nuvaring per her choice. Rx.  Birth Control I discussed multiple birth control options and methods with the patient.  The risks and benefits of each were reviewed.  The possible side effects including deep venous thrombosis, breast tenderness, fluid retention, mood changes and abnormal vaginal bleeding were discussed.  Combination as well as progesterone-only options, pros and cons counseled. A total of 15 minutes were spent face-to-face with the  patient during this encounter and over half of that time dealt with counseling and coordination of care.   Becky Major, MD, [facility], [facility] 06/04/2018  3:51 PM

## 2018-06-04 NOTE — Patient Instructions (Signed)
Ethinyl Estradiol; Etonogestrel vaginal ring What is this medicine? ETHINYL ESTRADIOL; ETONOGESTREL (ETH in il es tra DYE ole; et oh noe JES trel) vaginal ring is a flexible, vaginal ring used as a contraceptive (birth control method). This medicine combines two types of female hormones, an estrogen and a progestin. This ring is used to prevent ovulation and pregnancy. Each ring is effective for one month. This medicine may be used for other purposes; ask your health care provider or pharmacist if you have questions. COMMON BRAND NAME(S): NuvaRing What should I tell my health care provider before I take this medicine? They need to know if you have or ever had any of these conditions: -abnormal vaginal bleeding -blood vessel disease or blood clots -breast, cervical, endometrial, ovarian, liver, or uterine cancer -diabetes -gallbladder disease -heart disease or recent heart attack -high blood pressure -high cholesterol -kidney disease -liver disease -migraine headaches -stroke -systemic lupus erythematosus (SLE) -tobacco smoker -an unusual or allergic reaction to estrogens, progestins, other medicines, foods, dyes, or preservatives -pregnant or trying to get pregnant -breast-feeding How should I use this medicine? Insert the ring into your vagina as directed. Follow the directions on the prescription label. The ring will remain place for 3 weeks and is then removed for a 1-week break. A new ring is inserted 1 week after the last ring was removed, on the same day of the week. Check often to make sure the ring is still in place, especially before and after sexual intercourse. If the ring was out of the vagina for an unknown amount of time, you may not be protected from pregnancy. Perform a pregnancy test and call your doctor. Do not use more often than directed. A patient package insert for the product will be given with each prescription and refill. Read this sheet carefully each time. The  sheet may change frequently. Contact your pediatrician regarding the use of this medicine in children. Special care may be needed. This medicine has been used in female children who have started having menstrual periods. Overdosage: If you think you have taken too much of this medicine contact a poison control center or emergency room at once. NOTE: This medicine is only for you. Do not share this medicine with others. What if I miss a dose? You will need to replace your vaginal ring once a month as directed. If the ring should slip out, or if you leave it in longer or shorter than you should, contact your health care professional for advice. What may interact with this medicine? Do not take this medicine with the following medication: -dasabuvir; ombitasvir; paritaprevir; ritonavir -ombitasvir; paritaprevir; ritonavir This medicine may also interact with the following medications: -acetaminophen -antibiotics or medicines for infections, especially rifampin, rifabutin, rifapentine, and griseofulvin, and possibly penicillins or tetracyclines -aprepitant -ascorbic acid (vitamin C) -atorvastatin -barbiturate medicines, such as phenobarbital -bosentan -carbamazepine -caffeine -clofibrate -cyclosporine -dantrolene -doxercalciferol -felbamate -grapefruit juice -hydrocortisone -medicines for anxiety or sleeping problems, such as diazepam or temazepam -medicines for diabetes, including pioglitazone -modafinil -mycophenolate -nefazodone -oxcarbazepine -phenytoin -prednisolone -ritonavir or other medicines for HIV infection or AIDS -rosuvastatin -selegiline -soy isoflavones supplements -St. John's wort -tamoxifen or raloxifene -theophylline -thyroid hormones -topiramate -warfarin This list may not describe all possible interactions. Give your health care provider a list of all the medicines, herbs, non-prescription drugs, or dietary supplements you use. Also tell them if you smoke,  drink alcohol, or use illegal drugs. Some items may interact with your medicine. What should I watch for while using   this medicine? Visit your doctor or health care professional for regular checks on your progress. You will need a regular breast and pelvic exam and Pap smear while on this medicine. Use an additional method of contraception during the first cycle that you use this ring. Do not use a diaphragm or female condom, as the ring can interfere with these birth control methods and their proper placement. If you have any reason to think you are pregnant, stop using this medicine right away and contact your doctor or health care professional. If you are using this medicine for hormone related problems, it may take several cycles of use to see improvement in your condition. Smoking increases the risk of getting a blood clot or having a stroke while you are using hormonal birth control, especially if you are more than 31 years old. You are strongly advised not to smoke. This medicine can make your body retain fluid, making your fingers, hands, or ankles swell. Your blood pressure can go up. Contact your doctor or health care professional if you feel you are retaining fluid. This medicine can make you more sensitive to the sun. Keep out of the sun. If you cannot avoid being in the sun, wear protective clothing and use sunscreen. Do not use sun lamps or tanning beds/booths. If you wear contact lenses and notice visual changes, or if the lenses begin to feel uncomfortable, consult your eye care specialist. In some women, tenderness, swelling, or minor bleeding of the gums may occur. Notify your dentist if this happens. Brushing and flossing your teeth regularly may help limit this. See your dentist regularly and inform your dentist of the medicines you are taking. If you are going to have elective surgery, you may need to stop using this medicine before the surgery. Consult your health care professional  for advice. This medicine does not protect you against HIV infection (AIDS) or any other sexually transmitted diseases. What side effects may I notice from receiving this medicine? Side effects that you should report to your doctor or health care professional as soon as possible: -breast tissue changes or discharge -changes in vaginal bleeding during your period or between your periods -chest pain -coughing up blood -dizziness or fainting spells -headaches or migraines -leg, arm or groin pain -severe or sudden headaches -stomach pain (severe) -sudden shortness of breath -sudden loss of coordination, especially on one side of the body -speech problems -symptoms of vaginal infection like itching, irritation or unusual discharge -tenderness in the upper abdomen -vomiting -weakness or numbness in the arms or legs, especially on one side of the body -yellowing of the eyes or skin Side effects that usually do not require medical attention (report to your doctor or health care professional if they continue or are bothersome): -breakthrough bleeding and spotting that continues beyond the 3 initial cycles of pills -breast tenderness -mood changes, anxiety, depression, frustration, anger, or emotional outbursts -increased sensitivity to sun or ultraviolet light -nausea -skin rash, acne, or brown spots on the skin -weight gain (slight) This list may not describe all possible side effects. Call your doctor for medical advice about side effects. You may report side effects to FDA at 1-800-FDA-1088. Where should I keep my medicine? Keep out of the reach of children. Store at room temperature between 15 and 30 degrees C (59 and 86 degrees F) for up to 4 months. The product will expire after 4 months. Protect from light. Throw away any unused medicine after the expiration date. NOTE: This   sheet is a summary. It may not cover all possible information. If you have questions about this medicine, talk  to your doctor, pharmacist, or health care provider.  2018 Elsevier/Gold Standard (2016-04-07 17:00:31)  

## 2018-06-18 ENCOUNTER — Encounter: Payer: Self-pay | Admitting: Internal Medicine

## 2018-06-18 ENCOUNTER — Encounter

## 2018-06-18 ENCOUNTER — Ambulatory Visit (INDEPENDENT_AMBULATORY_CARE_PROVIDER_SITE_OTHER): Payer: Managed Care, Other (non HMO) | Admitting: Internal Medicine

## 2018-06-18 VITALS — BP 108/68 | HR 86 | Temp 98.7°F | Resp 18 | Ht 66.0 in | Wt 171.2 lb

## 2018-06-18 DIAGNOSIS — Z0000 Encounter for general adult medical examination without abnormal findings: Secondary | ICD-10-CM | POA: Diagnosis not present

## 2018-06-18 DIAGNOSIS — N6452 Nipple discharge: Secondary | ICD-10-CM

## 2018-06-18 DIAGNOSIS — F429 Obsessive-compulsive disorder, unspecified: Secondary | ICD-10-CM

## 2018-06-18 DIAGNOSIS — E7800 Pure hypercholesterolemia, unspecified: Secondary | ICD-10-CM | POA: Diagnosis not present

## 2018-06-18 LAB — COMPREHENSIVE METABOLIC PANEL
ALBUMIN: 4.3 g/dL (ref 3.5–5.2)
ALK PHOS: 55 U/L (ref 39–117)
ALT: 16 U/L (ref 0–35)
AST: 14 U/L (ref 0–37)
BILIRUBIN TOTAL: 0.6 mg/dL (ref 0.2–1.2)
BUN: 18 mg/dL (ref 6–23)
CO2: 24 mEq/L (ref 19–32)
Calcium: 9.1 mg/dL (ref 8.4–10.5)
Chloride: 105 mEq/L (ref 96–112)
Creatinine, Ser: 0.63 mg/dL (ref 0.40–1.20)
GFR: 116.75 mL/min (ref >60.00)
Glucose, Bld: 96 mg/dL (ref 70–99)
Potassium: 3.9 mEq/L (ref 3.5–5.1)
Sodium: 137 mEq/L (ref 135–145)
TOTAL PROTEIN: 7.1 g/dL (ref 6.0–8.3)

## 2018-06-18 LAB — LIPID PANEL
CHOLESTEROL: 242 mg/dL — AB (ref 0–200)
HDL: 35 mg/dL — ABNORMAL LOW (ref >39.00)
Total CHOL/HDL Ratio: 7

## 2018-06-18 LAB — CBC WITH DIFFERENTIAL/PLATELET
BASOS ABS: 0.1 10*3/uL (ref 0.0–0.1)
Basophils Relative: 0.6 % (ref 0.0–3.0)
Eosinophils Absolute: 0.3 10*3/uL (ref 0.0–0.7)
Eosinophils Relative: 3.2 % (ref 0.0–5.0)
HCT: 41 % (ref 36.0–46.0)
Hemoglobin: 14 g/dL (ref 12.0–15.0)
LYMPHS PCT: 28.2 % (ref 12.0–46.0)
Lymphs Abs: 2.4 10*3/uL (ref 0.7–4.0)
MCHC: 34.1 g/dL (ref 30.0–36.0)
MCV: 89.5 fl (ref 78.0–100.0)
MONOS PCT: 5.2 % (ref 3.0–12.0)
Monocytes Absolute: 0.4 10*3/uL (ref 0.1–1.0)
NEUTROS ABS: 5.2 10*3/uL (ref 1.4–7.7)
Neutrophils Relative %: 62.8 % (ref 43.0–77.0)
PLATELETS: 281 10*3/uL (ref 150.0–400.0)
RBC: 4.59 Mil/uL (ref 3.87–5.11)
RDW: 13 % (ref 11.5–15.5)
WBC: 8.4 10*3/uL (ref 4.0–10.5)

## 2018-06-18 LAB — LDL CHOLESTEROL, DIRECT: Direct LDL: 120 mg/dL

## 2018-06-18 LAB — TSH: TSH: 1.95 u[IU]/mL (ref 0.35–4.50)

## 2018-06-18 NOTE — Progress Notes (Signed)
Patient ID: Becky Mccoy, female   DOB: 15-Apr-1986, 32 y.o.   MRN: [id number]   Subjective:    Patient ID: Becky Mccoy, female    DOB: 02/05/1986, 32 y.o.   MRN: [id number]  HPI  Patient here for her physical exam. Saw gyn 05/23/18.  PAP negative.  Reevaluated by gyn 06/04/18.  Discussed birth control.  Overall she feels she is doing well.  Trying to stay active.  No chest pain.  No sob. No acid reflux.  No abdominal pain.  Bowels moving.  Off prozac.  Tapered off.  Has been off for 3 weeks.  Feels better. More energy.     Past Medical History:  Diagnosis Date  . History of chicken pox   . Hx: UTI (urinary tract infection)   . Obsessive-compulsive and related disorder   . OCD (obsessive compulsive disorder)    Past Surgical History:  Procedure Laterality Date  . WISDOM TOOTH EXTRACTION     Family History  Problem Relation Age of Onset  . Healthy Mother   . Healthy Father   . Cancer - Other Maternal Grandfather        stomach cancer  . Cancer - Other Paternal Grandfather        Bone cancer   Social History   Socioeconomic History  . Marital status: Married    Spouse name: Not on file  . Number of children: Not on file  . Years of education: Not on file  . Highest education level: Not on file  Occupational History  . Not on file  Social Needs  . Financial resource strain: Not on file  . Food insecurity:    Worry: Not on file    Inability: Not on file  . Transportation needs:    Medical: Not on file    Non-medical: Not on file  Tobacco Use  . Smoking status: Never Smoker  . Smokeless tobacco: Never Used  Substance and Sexual Activity  . Alcohol use: No    Alcohol/week: 0.0 standard drinks  . Drug use: No  . Sexual activity: Yes    Birth control/protection: Condom  Lifestyle  . Physical activity:    Days per week: Not on file    Minutes per session: Not on file  . Stress: Not on file  Relationships  . Social connections:    Talks on phone: Not on  file    Gets together: Not on file    Attends religious service: Not on file    Active member of club or organization: Not on file    Attends meetings of clubs or organizations: Not on file    Relationship status: Not on file  Other Topics Concern  . Not on file  Social History Narrative  . Not on file    Outpatient Encounter Medications as of 06/18/2018  Medication Sig  . FLUoxetine (PROZAC) 20 MG tablet Take 40 mg by mouth daily. PT TAKES 80MG  ONCE A DAY   . [DISCONTINUED] amoxicillin (AMOXIL) 500 MG tablet Take 1 tablet (500 mg total) by mouth 2 (two) times daily.  . [DISCONTINUED] etonogestrel-ethinyl estradiol (NUVARING) 0.12-0.015 MG/24HR vaginal ring Insert vaginally and leave in place for 3 consecutive weeks, then remove for 1 week.   Facility-Administered Encounter Medications as of 06/18/2018  Medication  . betamethasone acetate-betamethasone sodium phosphate (CELESTONE) injection 3 mg    Review of Systems  Constitutional: Negative for appetite change and unexpected weight change.  HENT: Negative for congestion and sinus pressure.  Eyes: Negative for pain and visual disturbance.  Respiratory: Negative for cough, chest tightness and shortness of breath.   Cardiovascular: Negative for chest pain, palpitations and leg swelling.  Gastrointestinal: Negative for abdominal pain, diarrhea, nausea and vomiting.  Genitourinary: Negative for difficulty urinating and dysuria.  Musculoskeletal: Negative for joint swelling and myalgias.  Skin: Negative for color change and rash.  Neurological: Negative for dizziness, light-headedness and headaches.  Hematological: Negative for adenopathy. Does not bruise/bleed easily.  Psychiatric/Behavioral: Negative for agitation and dysphoric mood.       Objective:    Physical Exam  Constitutional: She is oriented to person, place, and time. She appears well-developed and well-nourished. No distress.  HENT:  Nose: Nose normal.    Mouth/Throat: Oropharynx is clear and moist.  Eyes: Right eye exhibits no discharge. Left eye exhibits no discharge. No scleral icterus.  Neck: Neck supple. No thyromegaly present.  Cardiovascular: Normal rate and regular rhythm.  Pulmonary/Chest: Breath sounds normal. No accessory muscle usage. No tachypnea. No respiratory distress. She has no decreased breath sounds. She has no wheezes. She has no rhonchi. Right breast exhibits no inverted nipple, no mass, no nipple discharge and no tenderness (no axillary adenopathy). Left breast exhibits no inverted nipple, no mass, no nipple discharge and no tenderness (no axilarry adenopathy).  Abdominal: Soft. Bowel sounds are normal. There is no tenderness.  Musculoskeletal: She exhibits no edema or tenderness.  Lymphadenopathy:    She has no cervical adenopathy.  Neurological: She is alert and oriented to person, place, and time.  Skin: No rash noted. No erythema.  Psychiatric: She has a normal mood and affect. Her behavior is normal.    BP 108/68 (BP Location: Left Arm, Patient Position: Sitting, Cuff Size: Normal)   Pulse 86   Temp 98.7 F (37.1 C) (Oral)   Resp 18   Ht 5\' 6"  (1.676 m)   Wt 171 lb 3.2 oz (77.7 kg)   LMP 05/22/2018   SpO2 97%   BMI 27.63 kg/m  Wt Readings from Last 3 Encounters:  06/18/18 171 lb 3.2 oz (77.7 kg)  06/04/18 166 lb (75.3 kg)  05/23/18 166 lb (75.3 kg)     Lab Results  Component Value Date   WBC 8.4 06/18/2018   HGB 14.0 06/18/2018   HCT 41.0 06/18/2018   PLT 281.0 06/18/2018   GLUCOSE 96 06/18/2018   CHOL 242 (H) 06/18/2018   TRIG (H) 06/18/2018    525.0 Triglyceride is over 400; calculations on Lipids are invalid.   HDL 35.00 (L) 06/18/2018   LDLDIRECT 120.0 06/18/2018   LDLCALC 130 (H) 09/02/2014   ALT 16 06/18/2018   AST 14 06/18/2018   NA 137 06/18/2018   K 3.9 06/18/2018   CL 105 06/18/2018   CREATININE 0.63 06/18/2018   BUN 18 06/18/2018   CO2 24 06/18/2018   TSH 1.95 06/18/2018        Assessment & Plan:   Problem List Items Addressed This Visit    Health care maintenance    Physical today 06/18/18.  PAP 05/23/18 - [facility] - ok.  Check cholesterol.        Hypercholesterolemia    Low cholesterol diet and exercise.  Follow lipid panel.        Relevant Orders   CBC with Differential/Platelet (Completed)   Comprehensive metabolic panel (Completed)   TSH (Completed)   Lipid panel (Completed)   Nipple discharge    No further discharge.  No breast pain.  No nodules.  Discussed further w/up.  Since no further issues or problems, desires no further evaluation or w/up.        OCD (obsessive compulsive disorder)    Off prozac.  Doing well.  Does not feel needs anything more at this time.  Follow.         Other Visit Diagnoses    Routine general medical examination at a health care facility    -  Primary       [place], MD

## 2018-06-18 NOTE — Assessment & Plan Note (Signed)
Physical today 06/18/18.  PAP 05/23/18 - [facility] - ok.  Check cholesterol.

## 2018-06-19 ENCOUNTER — Other Ambulatory Visit: Payer: Self-pay | Admitting: Internal Medicine

## 2018-06-19 DIAGNOSIS — E781 Pure hyperglyceridemia: Secondary | ICD-10-CM

## 2018-06-19 NOTE — Progress Notes (Signed)
Order placed for f/u lipid panel.  

## 2018-06-23 ENCOUNTER — Encounter: Payer: Self-pay | Admitting: Internal Medicine

## 2018-06-23 NOTE — Assessment & Plan Note (Signed)
Low cholesterol diet and exercise.  Follow lipid panel.   

## 2018-06-23 NOTE — Assessment & Plan Note (Signed)
Off prozac.  Doing well.  Does not feel needs anything more at this time.  Follow.

## 2018-06-23 NOTE — Assessment & Plan Note (Signed)
No further discharge.  No breast pain.  No nodules.  Discussed further w/up.  Since no further issues or problems, desires no further evaluation or w/up.

## 2018-09-09 ENCOUNTER — Encounter: Payer: Self-pay | Admitting: Family Medicine

## 2018-09-09 ENCOUNTER — Ambulatory Visit (INDEPENDENT_AMBULATORY_CARE_PROVIDER_SITE_OTHER): Payer: Managed Care, Other (non HMO) | Admitting: Family Medicine

## 2018-09-09 ENCOUNTER — Encounter: Payer: Self-pay | Admitting: Internal Medicine

## 2018-09-09 ENCOUNTER — Ambulatory Visit (INDEPENDENT_AMBULATORY_CARE_PROVIDER_SITE_OTHER): Payer: Managed Care, Other (non HMO)

## 2018-09-09 VITALS — BP 110/78 | HR 87 | Temp 98.8°F | Resp 16 | Ht 66.0 in | Wt 164.8 lb

## 2018-09-09 DIAGNOSIS — M545 Low back pain, unspecified: Secondary | ICD-10-CM

## 2018-09-09 DIAGNOSIS — Z3200 Encounter for pregnancy test, result unknown: Secondary | ICD-10-CM

## 2018-09-09 LAB — POCT URINE PREGNANCY: Preg Test, Ur: NEGATIVE

## 2018-09-09 MED ORDER — KETOROLAC TROMETHAMINE 60 MG/2ML IM SOLN
60.0000 mg | Freq: Once | INTRAMUSCULAR | Status: AC
  Administered 2018-09-09: 60 mg via INTRAMUSCULAR

## 2018-09-09 MED ORDER — CYCLOBENZAPRINE HCL 5 MG PO TABS: 5.0000 mg | ORAL_TABLET | Freq: Three times a day (TID) | ORAL | 1 refills | Status: DC | PRN

## 2018-09-09 MED ORDER — DICLOFENAC SODIUM 75 MG PO TBEC: 75.0000 mg | DELAYED_RELEASE_TABLET | Freq: Two times a day (BID) | ORAL | 1 refills | Status: DC | PRN

## 2018-09-09 NOTE — Patient Instructions (Signed)

## 2018-09-09 NOTE — Telephone Encounter (Signed)
Pt scheduled with Leotis ShamesLauren

## 2018-09-09 NOTE — Progress Notes (Signed)
Subjective:    Patient ID: Becky Mccoy, female    DOB: 1986-01-11, 33 y.o.   MRN: [id number]  HPI  Patient presents to clinic complaining of low back pain.  Patient states she is an episode of low back pain off and on since November 2019.  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  Denies any fever or chills.  Denies nausea/vomiting or diarrhea.  Denies numbness in lower extremities.  Denies saddle anesthesia or loss of bowel/bladder control.   Patient Active Problem List   Diagnosis Date Noted  . Hypercholesterolemia 06/18/2018  . Nipple discharge 12/11/2016  . Chest pain 12/11/2016  . Health care maintenance 02/25/2015  . Thyroid fullness 06/07/2014  . OCD (obsessive compulsive disorder) 06/07/2014   Social History   Tobacco Use  . Smoking status: Never Smoker  . Smokeless tobacco: Never Used  Substance Use Topics  . Alcohol use: No    Alcohol/week: 0.0 standard drinks   Review of Systems  Constitutional: Negative for chills, fatigue and fever.  HENT: Negative for congestion, ear pain, sinus pain and sore throat.   Eyes: Negative.   Respiratory: Negative for cough, shortness of breath and wheezing.   Cardiovascular: Negative for chest pain, palpitations and leg swelling.  Gastrointestinal: Negative for abdominal pain, diarrhea, nausea and vomiting.  Genitourinary: Negative for dysuria, frequency and urgency.  Musculoskeletal: +low back pain Skin: Negative for color change, pallor and rash.  Neurological: Negative for syncope, light-headedness and headaches.  Psychiatric/Behavioral: The patient is not nervous/anxious.       Objective:   Physical Exam Vitals signs and nursing note reviewed.  Constitutional:      General: She is not in acute distress.    Appearance: She is not toxic-appearing.  HENT:     Head: Normocephalic and atraumatic.  Eyes:     General: No  scleral icterus.    Extraocular Movements: Extraocular movements intact.     Conjunctiva/sclera: Conjunctivae normal.     Pupils: Pupils are equal, round, and reactive to light.  Neck:     Musculoskeletal: Normal range of motion and neck supple. No neck rigidity.  Cardiovascular:     Rate and Rhythm: Normal rate and regular rhythm.  Pulmonary:     Effort: Pulmonary effort is normal. No respiratory distress.     Breath sounds: Normal breath sounds.  Musculoskeletal:       Back:     Comments: Positive tenderness across low back, left side more than right.  Straight leg raises do cause pain across low back, left leg raise more so than right leg raise.  Quadricep strength equal and strong.  Dorsi plantar strength equal and strong.  Lymphadenopathy:     Cervical: No cervical adenopathy.  Skin:    General: Skin is warm and dry.     Coloration: Skin is not pale.  Neurological:     Mental Status: She is alert and oriented to person, place, and time.     Cranial Nerves: No cranial nerve deficit.     Motor: No weakness.     Gait: Gait normal.  Psychiatric:        Mood and Affect: Mood normal.        Behavior: Behavior normal.    Vitals:   09/09/18 1550  BP: 110/78  Pulse: 87  Resp: 16  Temp: 98.8 F (37.1 C)  SpO2: 98%  Assessment & Plan:   Acute bilateral low back pain without sciatica-suspect patient's pain is related to strain/overuse.  Patient will get dose of Toradol x1 in clinic and will take diclofenac twice daily as needed for pain as well as a muscle relaxer as needed.  Patient given handout outlining back stretches and advised to do them to 3 times a day to keep muscles/joints stretched.  We will also get x-ray in clinic today to rule out any arthritis or disc abnormality. Administrations This Visit    ketorolac (TORADOL) injection 60 mg    Admin Date 09/09/2018 Action Given Dose 60 mg Route Intramuscular Administered By Clearnce Sorrel, RMA            Urine pregnancy done prior to x-ray and Toradol injection, it is negative  Patient will keep regularly scheduled follow-up with PCP as planned.  Advised that if pain continues to persist longer than 2 weeks we can consider referral to physical therapy.

## 2018-09-11 ENCOUNTER — Encounter: Payer: Self-pay | Admitting: Family Medicine

## 2018-09-11 ENCOUNTER — Telehealth: Payer: Self-pay | Admitting: Family Medicine

## 2018-09-11 NOTE — Telephone Encounter (Signed)
Yes the xray does show scoliosis.  This is her mychart message that I sent earlier: Your xray does show scoliosis in your lumbar spine, which is a curve in the spine. This finding in addition to the physical type work you do can explain your pain.   Often treatment of this includes conservative measures like anti-inflammatory medication and physical therapy. Sometimes referral to spine specialist is needed.   If you would like a physical therapy referral, let me know and I will be happy to place that for you.  Please ask if she would want PT referral.

## 2018-09-11 NOTE — Telephone Encounter (Signed)
Called Pt back and she wanted to know about her X-ray and spine results if she has scoliosis or not

## 2018-09-11 NOTE — Telephone Encounter (Signed)
Copied from CRM [id number]. Topic: General - Inquiry >> Sep 11, 2018 12:22 PM Maia Petties wrote: Reason for CRM: pt received mychart msg with results and is concerned and confused. Pt requesting call back for further information

## 2018-09-12 NOTE — Telephone Encounter (Signed)
Called Pt and spoke to her about her X-ray results, Pt does not want any referral at this time

## 2018-09-19 ENCOUNTER — Other Ambulatory Visit (INDEPENDENT_AMBULATORY_CARE_PROVIDER_SITE_OTHER): Payer: Managed Care, Other (non HMO)

## 2018-09-19 ENCOUNTER — Other Ambulatory Visit: Payer: Self-pay | Admitting: Psychiatry

## 2018-09-19 DIAGNOSIS — E781 Pure hyperglyceridemia: Secondary | ICD-10-CM | POA: Diagnosis not present

## 2018-09-19 LAB — LIPID PANEL
Cholesterol: 216 mg/dL — ABNORMAL HIGH (ref 0–200)
HDL: 32.6 mg/dL — ABNORMAL LOW (ref >39.00)
LDL Cholesterol: 152 mg/dL — ABNORMAL HIGH (ref 0–99)
NonHDL: 183.32
Total CHOL/HDL Ratio: 7
Triglycerides: 155 mg/dL — ABNORMAL HIGH (ref 0.0–149.0)
VLDL: 31 mg/dL (ref 0.0–40.0)

## 2018-09-19 MED ORDER — CLONAZEPAM 0.5 MG PO TABS: 0.5000 mg | ORAL_TABLET | Freq: Every day | ORAL | 0 refills | Status: DC

## 2018-09-19 NOTE — Progress Notes (Signed)
Patient not seen since April 2019 after weaning off Prozac.  Called [place] that she has had a setback with OCD and asking for something for sleep.  Clonazepam was called and at the same dose she took in the past.  She will be put on the cancellation list for an appointment.  Meredith Staggers, MD, DFAPA

## 2018-09-20 ENCOUNTER — Telehealth: Payer: Self-pay | Admitting: Psychiatry

## 2018-09-20 ENCOUNTER — Encounter: Payer: Self-pay | Admitting: Internal Medicine

## 2018-09-20 NOTE — Telephone Encounter (Signed)
LM with Becky Mccoy to call the office to schedule appt.  Hasn't been seen since 4/19 and needs follow up for medication management and continued refills.

## 2018-10-10 ENCOUNTER — Ambulatory Visit (INDEPENDENT_AMBULATORY_CARE_PROVIDER_SITE_OTHER): Payer: Managed Care, Other (non HMO) | Admitting: Psychiatry

## 2018-10-10 ENCOUNTER — Encounter: Payer: Self-pay | Admitting: Psychiatry

## 2018-10-10 DIAGNOSIS — F429 Obsessive-compulsive disorder, unspecified: Secondary | ICD-10-CM | POA: Diagnosis not present

## 2018-10-10 DIAGNOSIS — F5105 Insomnia due to other mental disorder: Secondary | ICD-10-CM

## 2018-10-10 MED ORDER — CLONAZEPAM 1 MG PO TABS: ORAL_TABLET | ORAL | 1 refills | Status: DC

## 2018-10-10 MED ORDER — ARIPIPRAZOLE 5 MG PO TABS: 5.0000 mg | ORAL_TABLET | Freq: Every day | ORAL | 1 refills | Status: DC

## 2018-10-10 MED ORDER — FLUOXETINE HCL 20 MG PO TABS: 80.0000 mg | ORAL_TABLET | Freq: Every day | ORAL | 1 refills | Status: DC

## 2018-10-10 NOTE — Progress Notes (Signed)
Becky Mccoy [id number] 11-20-1985 33 y.o.  Subjective:   Patient ID:  Becky Mccoy is a 33 y.o. (DOB [birth year]) female.  Chief Complaint:  Chief Complaint  Patient presents with  . Follow-up    Medication Management  . Anxiety    Relapse of OCD  . Sleeping Problem    HPI Becky Mccoy presents to the office today for follow-up of exacerbation of severe OCD of the scrupulosity type.  Came off the fluoxetine, she wanted to try.  Ok for 4 months and then it came back and hit her really bad.  Was almost gone and then recurred.  The same thoughts scrupulosity same as before.  Restarted the Prozac at 61 bc it was so bad.  It's still really bad.  Feels like she'll never get better.  On this dose for 3 weeks.  Don't feel like doing anything.  Trying to make corrections in her thoughts.  Anxiety is far greater than the depression.  Has to make herself do things bc hard to focus on anything else.  Compulsive praying in response. Compulsive checking for reassurance with husband.  Not markedly depressed but has thoughts about death without suicidal intent or plan.  Lost 20#/3 mo both intentionally and recently poor appetite.  EMA with panic at 2 am.  Average 4-5 hours.  Tired and drowsy daytime.  Can't nap DT OCD anxiety. Also panic attacks.  Past Psychiatric Medication Trials: Fluoxetine 80 with benefit but weight gain, Trintellix, sertraline 250 caused side effects and weight gain and forgetfulness and sweating, Xanax was not effective, clonazepam was helpful for sleep and anxiety  Review of Systems:  Review of Systems  Neurological: Negative for tremors and weakness.  Psychiatric/Behavioral: Positive for sleep disturbance. Negative for agitation, behavioral problems, confusion, decreased concentration, dysphoric mood, hallucinations, self-injury and suicidal ideas. The patient is nervous/anxious. The patient is not hyperactive.     Medications: I have reviewed the patient's  current medications.  Current Outpatient Medications  Medication Sig Dispense Refill  . FLUoxetine (PROZAC) 20 MG tablet Take 4 tablets (80 mg total) by mouth daily. PT TAKES 80MG  ONCE A DAY 120 tablet 1  . ARIPiprazole (ABILIFY) 5 MG tablet Take 1 tablet (5 mg total) by mouth daily. 30 tablet 1  . clonazePAM (KLONOPIN) 1 MG tablet 1 twice a day as needed for anxiety and 1 to 2 at night as needed for severe insomnia 120 tablet 1  . cyclobenzaprine (FLEXERIL) 5 MG tablet Take 1 tablet (5 mg total) by mouth 3 (three) times daily as needed for muscle spasms. (Patient not taking: Reported on 10/10/2018) 30 tablet 1  . diclofenac (VOLTAREN) 75 MG EC tablet Take 1 tablet (75 mg total) by mouth 2 (two) times daily as needed. (Patient not taking: Reported on 10/10/2018) 30 tablet 1   Current Facility-Administered Medications  Medication Dose Route Frequency Provider Last Rate Last Dose  . betamethasone acetate-betamethasone sodium phosphate (CELESTONE) injection 3 mg  3 mg Intramuscular Once Felecia Shelling, DPM        Medication Side Effects: None  Allergies:  Allergies  Allergen Reactions  . Sulfa Antibiotics     Past Medical History:  Diagnosis Date  . History of chicken pox   . Hx: UTI (urinary tract infection)   . Obsessive-compulsive and related disorder   . OCD (obsessive compulsive disorder)     Family History  Problem Relation Age of Onset  . Healthy Mother   . Healthy Father   .  Cancer - Other Maternal Grandfather        stomach cancer  . Cancer - Other Paternal Grandfather        Bone cancer    Social History   Socioeconomic History  . Marital status: Married    Spouse name: Not on file  . Number of children: Not on file  . Years of education: Not on file  . Highest education level: Not on file  Occupational History  . Not on file  Social Needs  . Financial resource strain: Not on file  . Food insecurity:    Worry: Not on file    Inability: Not on file  .  Transportation needs:    Medical: Not on file    Non-medical: Not on file  Tobacco Use  . Smoking status: Never Smoker  . Smokeless tobacco: Never Used  Substance and Sexual Activity  . Alcohol use: No    Alcohol/week: 0.0 standard drinks  . Drug use: No  . Sexual activity: Yes    Birth control/protection: Condom  Lifestyle  . Physical activity:    Days per week: Not on file    Minutes per session: Not on file  . Stress: Not on file  Relationships  . Social connections:    Talks on phone: Not on file    Gets together: Not on file    Attends religious service: Not on file    Active member of club or organization: Not on file    Attends meetings of clubs or organizations: Not on file    Relationship status: Not on file  . Intimate partner violence:    Fear of current or ex partner: Not on file    Emotionally abused: Not on file    Physically abused: Not on file    Forced sexual activity: Not on file  Other Topics Concern  . Not on file  Social History Narrative  . Not on file    Past Medical History, Surgical history, Social history, and Family history were reviewed and updated as appropriate.   Please see review of systems for further details on the patient's review from today.   Objective:   Physical Exam:  There were no vitals taken for this visit.  Physical Exam Constitutional:      General: She is not in acute distress.    Appearance: She is well-developed.  Musculoskeletal:        General: No deformity.  Neurological:     Mental Status: She is alert and oriented to person, place, and time.     Coordination: Coordination normal.  Psychiatric:        Attention and Perception: Attention normal. She is attentive.        Mood and Affect: Mood is anxious. Mood is not depressed. Affect is not labile, blunt, angry or inappropriate.        Speech: Speech normal.        Behavior: Behavior normal.        Thought Content: Thought content does not include homicidal  or suicidal ideation.        Cognition and Memory: Cognition normal.        Judgment: Judgment normal.     Comments: Insight is fair to good. Severe OCD relapse     Lab Review:     Component Value Date/Time   NA 137 06/18/2018 0832   NA 142 03/24/2014 1617   K 3.9 06/18/2018 0832   K 4.0 03/24/2014 1617  CL 105 06/18/2018 0832   CL 105 03/24/2014 1617   CO2 24 06/18/2018 0832   CO2 26 03/24/2014 1617   GLUCOSE 96 06/18/2018 0832   GLUCOSE 93 03/24/2014 1617   BUN 18 06/18/2018 0832   BUN 13 03/24/2014 1617   CREATININE 0.63 06/18/2018 0832   CREATININE 0.62 03/24/2014 1617   CALCIUM 9.1 06/18/2018 0832   CALCIUM 9.5 03/24/2014 1617   PROT 7.1 06/18/2018 0832   ALBUMIN 4.3 06/18/2018 0832   AST 14 06/18/2018 0832   ALT 16 06/18/2018 0832   ALKPHOS 55 06/18/2018 0832   BILITOT 0.6 06/18/2018 0832   GFRNONAA >60 08/03/2016 1426   GFRNONAA >60 03/24/2014 1617   GFRAA >60 08/03/2016 1426   GFRAA >60 03/24/2014 1617       Component Value Date/Time   WBC 8.4 06/18/2018 0832   RBC 4.59 06/18/2018 0832   HGB 14.0 06/18/2018 0832   HGB 14.5 03/24/2014 1617   HCT 41.0 06/18/2018 0832   HCT 44.2 03/24/2014 1617   PLT 281.0 06/18/2018 0832   PLT 314 03/24/2014 1617   MCV 89.5 06/18/2018 0832   MCV 91 03/24/2014 1617   MCH 30.1 08/03/2016 1426   MCHC 34.1 06/18/2018 0832   RDW 13.0 06/18/2018 0832   RDW 12.9 03/24/2014 1617   LYMPHSABS 2.4 06/18/2018 0832   LYMPHSABS 2.0 03/24/2014 1617   MONOABS 0.4 06/18/2018 0832   MONOABS 0.4 03/24/2014 1617   EOSABS 0.3 06/18/2018 0832   EOSABS 0.1 03/24/2014 1617   BASOSABS 0.1 06/18/2018 0832   BASOSABS 0.0 03/24/2014 1617    No results found for: POCLITH, LITHIUM   No results found for: PHENYTOIN, PHENOBARB, VALPROATE, CBMZ   .res Assessment: Plan:    Obsessive-compulsive disorder, unspecified type - Plan: clonazePAM (KLONOPIN) 1 MG tablet, ARIPiprazole (ABILIFY) 5 MG tablet, FLUoxetine (PROZAC) 20 MG  tablet  Insomnia due to mental condition - Plan: clonazePAM (KLONOPIN) 1 MG tablet   Patient with a history of scrupulosity which relapsed after 4 months off Prozac.  She had excessive weight gain from both sertraline and then Prozac.  The Prozac was effective at 60 mg a day.  Her symptoms are very severe and she wants the maximal effect as soon as possible.  Therefore per her request we will increase to 80 mg a day which she has tolerated in the past.  We will also add about Abilify 5 mg to potentiate the antidepressant speed the recovery.  Discussed the side effects of each medication.  Explained the nature of OCD.  She has a lot of questions around the cause and is seeking some reassurance about her thoughts being truly her own thoughts or thoughts created by OCD.  Explained the diagnosis in detail.  Increase clonazepam to 1.5 mg to 2.0 for severe insomnia. We discussed the short-term risks associated with benzodiazepines including sedation and increased fall risk among others.  Discussed long-term side effect risk including dependence, potential withdrawal symptoms, and the potential eventual dose-related risk of dementia.   This was a 30 minute appointment  Follow-up 6 weeks  Meredith Staggers, MD, DFAPA  Please see After Visit Summary for patient specific instructions.  Future Appointments  Date Time Provider Department Center  06/24/2019  3:30 PM Dale Grimes, MD [id number]    No orders of the defined types were placed in this encounter.     -------------------------------

## 2018-11-18 ENCOUNTER — Encounter: Payer: Self-pay | Admitting: Psychiatry

## 2018-11-18 ENCOUNTER — Ambulatory Visit (INDEPENDENT_AMBULATORY_CARE_PROVIDER_SITE_OTHER): Payer: Self-pay | Admitting: Psychiatry

## 2018-11-18 DIAGNOSIS — F325 Major depressive disorder, single episode, in full remission: Secondary | ICD-10-CM

## 2018-11-18 DIAGNOSIS — F429 Obsessive-compulsive disorder, unspecified: Secondary | ICD-10-CM

## 2018-11-18 DIAGNOSIS — F5105 Insomnia due to other mental disorder: Secondary | ICD-10-CM

## 2018-11-18 NOTE — Progress Notes (Signed)
[name] [id number] 1986-01-30 33 y.o.  Subjective:   Patient ID:  Becky Mccoy is a 33 y.o. (DOB Jan 23, 1986) female.  Chief Complaint:  Chief Complaint  Patient presents with  . Anxiety    scrupulosity  . Depression  . Follow-up    med change    HPI Becky Mccoy presents  today for follow-up of exacerbation of severe OCD of the scrupulosity type.  Her last visit was October 10, 2018.  Patient with a history of scrupulosity which relapsed after 4 months off Prozac.  She had excessive weight gain from both sertraline and then Prozac.  The Prozac was effective at 60 mg a day.  Her symptoms are very severe and she wants the maximal effect as soon as possible.  Therefore we increased the fluoxetine to 80 mg a day and added Abilify 5 mg to try to speed up the response rate.  It is anticipated that that will be discontinued later.  No longer panic.Pt reports that mood is Anxious and describes anxiety as Minimal. Anxiety symptoms include: Excessive Worry, Obsessive Compulsive Symptoms:   None, scrupulosity and some rituals better,. Pt reports no sleep issues. Pt reports that appetite is increased. Pt reports that energy is good and good. Concentration is good. Suicidal thoughts:  denied by patient.  Gained 10#.  Started walking.  Came off the fluoxetine, she wanted to try.  Ok for 4 months and then it came back and hit her really bad.  Was almost gone and then recurred.  The same thoughts scrupulosity same as before.  Restarted the Prozac at 60 bc it was so bad.  It's still really bad.  Feels like she'll never get better.  On this dose for 3 weeks.  Don't feel like doing anything.  Trying to make corrections in her thoughts.  Anxiety is far greater than the depression.  Has to make herself do things bc hard to focus on anything else.  Compulsive praying in response. Compulsive checking for reassurance with husband.  Not markedly depressed but has thoughts about death without  suicidal intent or plan.  Lost 20#/3 mo both intentionally and recently poor appetite.  EMA with panic at 2 am.  Average 4-5 hours.  Tired and drowsy daytime.  Can't nap DT OCD anxiety. Also panic attacks.  Past Psychiatric Medication Trials: Fluoxetine 80 with benefit but weight gain, Trintellix, sertraline 250 caused side effects and weight gain and forgetfulness and sweating, Xanax was not effective, clonazepam was helpful for sleep and anxiety  Review of Systems:  Review of Systems  Neurological: Negative for tremors and weakness.  Psychiatric/Behavioral: Negative for agitation, behavioral problems, confusion, decreased concentration, dysphoric mood, hallucinations, self-injury, sleep disturbance and suicidal ideas. The patient is not nervous/anxious and is not hyperactive.     Medications: I have reviewed the patient's current medications.  Current Outpatient Medications  Medication Sig Dispense Refill  . ARIPiprazole (ABILIFY) 5 MG tablet Take 1 tablet (5 mg total) by mouth daily. 30 tablet 1  . clonazePAM (KLONOPIN) 1 MG tablet 1 twice a day as needed for anxiety and 1 to 2 at night as needed for severe insomnia 120 tablet 1  . FLUoxetine (PROZAC) 20 MG tablet Take 4 tablets (80 mg total) by mouth daily. PT TAKES  ONCE A DAY 120 tablet 1  . cyclobenzaprine (FLEXERIL) 5 MG tablet Take 1 tablet (5 mg total) by mouth 3 (three) times daily as needed for muscle spasms. (Patient not taking: Reported on 10/10/2018)  30 tablet 1  . diclofenac (VOLTAREN) 75 MG EC tablet Take 1 tablet (75 mg total) by mouth 2 (two) times daily as needed. (Patient not taking: Reported on 10/10/2018) 30 tablet 1   Current Facility-Administered Medications  Medication Dose Route Frequency Provider Last Rate Last Dose  . betamethasone acetate-betamethasone sodium phosphate (CELESTONE) injection 3 mg  3 mg Intramuscular Once Felecia Shelling, DPM        Medication Side Effects: None  Allergies:  Allergies   Allergen Reactions  . Sulfa Antibiotics     Past Medical History:  Diagnosis Date  . History of chicken pox   . Hx: UTI (urinary tract infection)   . Obsessive-compulsive and related disorder   . OCD (obsessive compulsive disorder)     Family History  Problem Relation Age of Onset  . Healthy Mother   . Healthy Father   . Cancer - Other Maternal Grandfather        stomach cancer  . Cancer - Other Paternal Grandfather        Bone cancer    Social History   Socioeconomic History  . Marital status: Married    Spouse name: Not on file  . Number of children: Not on file  . Years of education: Not on file  . Highest education level: Not on file  Occupational History  . Not on file  Social Needs  . Financial resource strain: Not on file  . Food insecurity:    Worry: Not on file    Inability: Not on file  . Transportation needs:    Medical: Not on file    Non-medical: Not on file  Tobacco Use  . Smoking status: Never Smoker  . Smokeless tobacco: Never Used  Substance and Sexual Activity  . Alcohol use: No    Alcohol/week: 0.0 standard drinks  . Drug use: No  . Sexual activity: Yes    Birth control/protection: Condom  Lifestyle  . Physical activity:    Days per week: Not on file    Minutes per session: Not on file  . Stress: Not on file  Relationships  . Social connections:    Talks on phone: Not on file    Gets together: Not on file    Attends religious service: Not on file    Active member of club or organization: Not on file    Attends meetings of clubs or organizations: Not on file    Relationship status: Not on file  . Intimate partner violence:    Fear of current or ex partner: Not on file    Emotionally abused: Not on file    Physically abused: Not on file    Forced sexual activity: Not on file  Other Topics Concern  . Not on file  Social History Narrative  . Not on file    Past Medical History, Surgical history, Social history, and Family  history were reviewed and updated as appropriate.   Please see review of systems for further details on the patient's review from today.   Objective:   Physical Exam:  There were no vitals taken for this visit.  Physical Exam Neurological:     Mental Status: She is alert and oriented to person, place, and time.     Cranial Nerves: No dysarthria.  Psychiatric:        Attention and Perception: Attention normal.        Mood and Affect: Mood is anxious.  Speech: Speech normal.        Behavior: Behavior is cooperative.        Thought Content: Thought content normal. Thought content is not paranoid or delusional. Thought content does not include homicidal or suicidal ideation. Thought content does not include homicidal or suicidal plan.        Cognition and Memory: Cognition and memory normal.        Judgment: Judgment normal.     Lab Review:     Component Value Date/Time   NA 137 06/18/2018 0832   NA 142 03/24/2014 1617   K 3.9 06/18/2018 0832   K 4.0 03/24/2014 1617   CL 105 06/18/2018 0832   CL 105 03/24/2014 1617   CO2 24 06/18/2018 0832   CO2 26 03/24/2014 1617   GLUCOSE 96 06/18/2018 0832   GLUCOSE 93 03/24/2014 1617   BUN 18 06/18/2018 0832   BUN 13 03/24/2014 1617   CREATININE 0.63 06/18/2018 0832   CREATININE 0.62 03/24/2014 1617   CALCIUM 9.1 06/18/2018 0832   CALCIUM 9.5 03/24/2014 1617   PROT 7.1 06/18/2018 0832   ALBUMIN 4.3 06/18/2018 0832   AST 14 06/18/2018 0832   ALT 16 06/18/2018 0832   ALKPHOS 55 06/18/2018 0832   BILITOT 0.6 06/18/2018 0832   GFRNONAA >60 08/03/2016 1426   GFRNONAA >60 03/24/2014 1617   GFRAA >60 08/03/2016 1426   GFRAA >60 03/24/2014 1617       Component Value Date/Time   WBC 8.4 06/18/2018 0832   RBC 4.59 06/18/2018 0832   HGB 14.0 06/18/2018 0832   HGB 14.5 03/24/2014 1617   HCT 41.0 06/18/2018 0832   HCT 44.2 03/24/2014 1617   PLT 281.0 06/18/2018 0832   PLT 314 03/24/2014 1617   MCV 89.5 06/18/2018 0832   MCV  91 03/24/2014 1617   MCH 30.1 08/03/2016 1426   MCHC 34.1 06/18/2018 0832   RDW 13.0 06/18/2018 0832   RDW 12.9 03/24/2014 1617   LYMPHSABS 2.4 06/18/2018 0832   LYMPHSABS 2.0 03/24/2014 1617   MONOABS 0.4 06/18/2018 0832   MONOABS 0.4 03/24/2014 1617   EOSABS 0.3 06/18/2018 0832   EOSABS 0.1 03/24/2014 1617   BASOSABS 0.1 06/18/2018 0832   BASOSABS 0.0 03/24/2014 1617    No results found for: POCLITH, LITHIUM   No results found for: PHENYTOIN, PHENOBARB, VALPROATE, CBMZ   .res Assessment: Plan:    Obsessive-compulsive disorder, unspecified type  Insomnia due to mental condition  Major depression, single episode, in complete remission [facility](HCC)   Patient with a history of scrupulosity which relapsed after 4 months off Prozac.  She had excessive weight gain from both sertraline and then Prozac.  The Prozac was effective at 60 mg a day.  Her symptoms were very severe at the last visit and she wanted the maximal effect as soon as possible.  Therefore per her request we  Increased fluoxetine to 80 mg a day which she has tolerated in the past.  We  also added about Abilify 5 mg to potentiate the antidepressant speed the recovery.  Discussed the side effects of each medication.  Fortunately that worked.  She is dramatically better in 5 weeks which is very unusual for OCD.  She has some residual OCD and residual anxiety but is not severe and not interfering with her daily life.  Not causing depression anymore like it was at the last visit.  She is not having insomnia like she was at the last visit.  She is  tolerating the medications well.  She mentioned that she may be pregnant.  I indicated to her that it would not be good for her to be pregnant and taking clonazepam.  Recommend a pregnancy test as soon as possible.  In the meantime I would recommend reduction in clonazepam.  She is averaging about 1 mg a day.  Because of the possible pregnancy reducing the clonazepam probably should take  priority over getting her off of Abilify which we also plan to do with time.  Encouraged her to Reduce clonazepam to 0.75 mg nightly for 1 week then to 0.5 mg nightly and try to reduce further and stop if you find out you are pregnant.  Notify us as to whether or not you are pregnant.  If she determines that she is not pregnant then we will also reduce Abilify to one half of a 5 mg tablet with plans to stop that soon.  We discussed that benzodiazepines are category D with regard to pregnancy but it is likely that the negative effects are dose related and they are infrequent. We discussed the short-term risks associated with benzodiazepines including sedation and increased fall risk among others.  Discussed long-term side effect risk including dependence, potential withdrawal symptoms, and the potential eventual dose-related risk of dementia.  This was a 30 minute appointment  Follow-up 6 weeks  I connected with patient by a video enabled telemedicine application or telephone, with their informed consent, and verified patient privacy and that I am speaking with the correct person using two identifiers.  I was located at office and patient at home.  Meredith Staggers, MD, DFAPA  Please see After Visit Summary for patient specific instructions.  Future Appointments  Date Time Provider Department Center  12/30/2018  4:00 PM Cottle, Steva Ready., MD [facility] None  06/24/2019  3:30 PM Dale Schubert, MD [facility]    No orders of the defined types were placed in this encounter.     -------------------------------

## 2018-12-21 ENCOUNTER — Other Ambulatory Visit: Payer: Self-pay | Admitting: Psychiatry

## 2018-12-21 DIAGNOSIS — F429 Obsessive-compulsive disorder, unspecified: Secondary | ICD-10-CM

## 2018-12-23 ENCOUNTER — Telehealth: Payer: Self-pay

## 2018-12-23 NOTE — Telephone Encounter (Signed)
Left detailed msg.

## 2018-12-23 NOTE — Telephone Encounter (Signed)
Pt has question about a medication she is on.  [phone]  Pt is on 1/2mg  Clonopin and has had three positive preg tests today.  Can she stop taking the clonopin?  [phone].

## 2018-12-23 NOTE — Telephone Encounter (Signed)
Yes, stop Clonopin.

## 2018-12-24 ENCOUNTER — Encounter: Payer: Self-pay | Admitting: Internal Medicine

## 2018-12-24 DIAGNOSIS — Z3201 Encounter for pregnancy test, result positive: Secondary | ICD-10-CM

## 2018-12-24 DIAGNOSIS — N926 Irregular menstruation, unspecified: Secondary | ICD-10-CM

## 2018-12-24 NOTE — Telephone Encounter (Signed)
Pt was screened. Scheduled pt for labs in the morning. She was wanting to know if she could do a urine pregnancy as well while she is here so she will know before she leaves. She is wanting test done because she has taken multiple home test and all were positive. She is not late yet but is supposed to start in 3 days

## 2018-12-24 NOTE — Telephone Encounter (Signed)
I ordered both tests.  Not sure if I put the urine in correctly. Please confirm.  Ok to do both.

## 2018-12-24 NOTE — Telephone Encounter (Signed)
I have placed order for lab.  I assume the reason is she has not had period recently.  Need to know reason she is questioning so that I can confirm I used the correct diagnosis when ordering the test.  I am ok for her to come in to have drawn as long as no COVID exposure, symptoms, etc.

## 2018-12-24 NOTE — Telephone Encounter (Signed)
Orders are in correctly

## 2018-12-25 ENCOUNTER — Telehealth: Payer: Self-pay | Admitting: *Deleted

## 2018-12-25 ENCOUNTER — Other Ambulatory Visit: Payer: Self-pay | Admitting: Psychiatry

## 2018-12-25 ENCOUNTER — Other Ambulatory Visit: Payer: Self-pay

## 2018-12-25 ENCOUNTER — Other Ambulatory Visit (INDEPENDENT_AMBULATORY_CARE_PROVIDER_SITE_OTHER): Payer: Self-pay

## 2018-12-25 ENCOUNTER — Telehealth: Payer: Self-pay | Admitting: Psychiatry

## 2018-12-25 DIAGNOSIS — Z3201 Encounter for pregnancy test, result positive: Secondary | ICD-10-CM

## 2018-12-25 LAB — POCT URINE PREGNANCY: Preg Test, Ur: POSITIVE — AB

## 2018-12-25 LAB — HCG, QUANTITATIVE, PREGNANCY: Quantitative HCG: 66 m[IU]/mL

## 2018-12-25 MED ORDER — ZALEPLON 10 MG PO CAPS: 10.0000 mg | ORAL_CAPSULE | Freq: Every evening | ORAL | 1 refills | Status: DC | PRN

## 2018-12-25 NOTE — Telephone Encounter (Signed)
Please notify pt that her urine pregnancy test is positive.  Need to clarify what medications she is taking.   She sees psychiatry and they have her on a couple of meds.  (will need to let psychiatry know pregnant)  They may need to adjust her medications.  If she is on clonazepam, hold until she talks with them and sees what they want her to do.  Let us know after she speaks to them.

## 2018-12-25 NOTE — Telephone Encounter (Signed)
Pt called back. °

## 2018-12-25 NOTE — Telephone Encounter (Signed)
Stopped Abilify since last appt and doing ok. Still taking fluoxetine 80 mg and needs to stay on it.  Because of severe OCD when she is off the medication.  The OCD is unmanageable and the level of anxiety that calls without fluoxetine.  We discussed the risks that are known and unknown associated with taking fluoxetine during pregnancy.  She is about a month pregnant now.  She agrees to this plan.  She is already stopped the clonazepam.  Her OB previously had okayed the use of Sonata in pregnancy so I will prescribe that to her for early morning awakening anxiety.

## 2018-12-25 NOTE — Telephone Encounter (Signed)
LMTCB

## 2018-12-25 NOTE — Telephone Encounter (Signed)
I called pt and informed her to continue prozac.  Explained this is not a medication that you can just stop.  She has already stopped the clonazepam. Has been off for a few days.  She is having some trouble sleeping, but doing well through the day.  Discussed with her today.  Discussed the need to contact her psychiatrist today and make them aware she is pregnant and discuss medications with him.  She will contact her psychiatrist and call us back with update.

## 2018-12-25 NOTE — Telephone Encounter (Signed)
Pt called to report she's not sleeping since off Klonapin due to Pregnancy. Please advise if something else for her.

## 2018-12-25 NOTE — Telephone Encounter (Signed)
She's pregnant 1 month.  Stopped Klonopin 2 nights ago from 0.5 mg for about 3 weeks.  Trouble staying asleep.  Since off the meds.  Better last night than the night before. Saw PCP today and OB on 5/28.    Anxiety is still managed reasonably well.  Melatonin trial  Rec Sonata bc OB Rx it before.  Meredith Staggers, MD, DFAPA

## 2018-12-25 NOTE — Telephone Encounter (Signed)
After resulting POCT urine pregnancy test, I received a warning page in regards to patients current medications.  High risk warning: Voltaren & Klonopin  Medium risk warning: Prozac, Flexeril, & Abilify

## 2018-12-25 NOTE — Telephone Encounter (Signed)
I called the pt and informed her of her positive result, I also asked pt about her medications, she stated she is taking 80 mg of Prozac only and that she has a appointment on Monday with her psychiatrist, I informed her to stop taking this medication until she speaks with her psychatrist on Monday and sees if they will adjust the medication, pt understood.  Becky Mccoy,cma

## 2018-12-26 ENCOUNTER — Encounter: Payer: Self-pay | Admitting: Internal Medicine

## 2018-12-27 NOTE — Telephone Encounter (Signed)
Called pt for update. Unable to leave message

## 2018-12-30 ENCOUNTER — Ambulatory Visit: Payer: Self-pay | Admitting: Psychiatry

## 2018-12-30 NOTE — Telephone Encounter (Signed)
Pt has appt with GYN on 5/28 per chart

## 2019-01-09 ENCOUNTER — Ambulatory Visit (INDEPENDENT_AMBULATORY_CARE_PROVIDER_SITE_OTHER): Payer: 59 | Admitting: Obstetrics & Gynecology

## 2019-01-09 ENCOUNTER — Other Ambulatory Visit: Payer: Self-pay

## 2019-01-09 ENCOUNTER — Encounter: Payer: Self-pay | Admitting: Obstetrics & Gynecology

## 2019-01-09 ENCOUNTER — Telehealth: Payer: Self-pay

## 2019-01-09 VITALS — Wt 168.0 lb

## 2019-01-09 DIAGNOSIS — Z3481 Encounter for supervision of other normal pregnancy, first trimester: Secondary | ICD-10-CM

## 2019-01-09 DIAGNOSIS — N926 Irregular menstruation, unspecified: Secondary | ICD-10-CM

## 2019-01-09 DIAGNOSIS — Z3A01 Less than 8 weeks gestation of pregnancy: Secondary | ICD-10-CM

## 2019-01-09 DIAGNOSIS — Z3490 Encounter for supervision of normal pregnancy, unspecified, unspecified trimester: Secondary | ICD-10-CM

## 2019-01-09 NOTE — Progress Notes (Signed)
01/09/2019   Chief Complaint: Missed period  Virtual Visit via Telephone Note  I connected with Becky Mccoy on 01/09/19 at  8:20 AM EDT by telephone and verified that I am speaking with the correct person using two identifiers.   I discussed the limitations, risks, security and privacy concerns of performing an evaluation and management service by telephone and the availability of in person appointments. I also discussed with the patient that there may be a patient responsible charge related to this service. The patient expressed understanding and agreed to proceed. She was at home and I was in my office.   History of Present Illness: Becky Mccoy is a 33 y.o. [id number] [redacted]w[redacted]d based on Patient's last menstrual period was 11/28/2018. with an Estimated Date of Delivery: 09/04/19, with the above CC.   Her periods were: regular periods every 28 days She was using no method when she conceived.  She has Positive signs or symptoms of nausea/vomiting of pregnancy. She has Negative signs or symptoms of miscarriage or preterm labor She identifies Negative Zika risk factors for her and her partner On any different medications around the time she conceived/early pregnancy: Yes      She has OCD and wa son Prozac, Abilify, Sonata, and Clonopin related to this.  She is now just taking Prozac, in consultation w her psychiatrist and PCP providers. History of varicella: Yes   ROS: A 12-point review of systems was performed and negative, except as stated in the above HPI.  OBGYN History: As per HPI. OB History  Gravida Para Term Preterm AB Living  4 2 2   1 2   SAB TAB Ectopic Multiple Live Births  1            # Outcome Date GA Lbr Len/2nd Weight Sex Delivery Anes PTL Lv  4 Current           3 Term 12/27/12 [redacted]w[redacted]d  7 lb (3.175 kg) F Vag-Spont     2 Term 03/31/06 [redacted]w[redacted]d  6 lb (2.722 kg) M Vag-Spont     1 SAB             Any issues with any prior pregnancies: no Any prior children are healthy, doing  well, without any problems or issues: yes History of pap smears: Yes. Last pap smear 05/2018. Abnormal: no  History of STIs: No   Past Medical History: Past Medical History:  Diagnosis Date  . History of chicken pox   . Hx: UTI (urinary tract infection)   . Obsessive-compulsive and related disorder   . OCD (obsessive compulsive disorder)     Past Surgical History: Past Surgical History:  Procedure Laterality Date  . WISDOM TOOTH EXTRACTION      Family History:  Family History  Problem Relation Age of Onset  . Healthy Mother   . Healthy Father   . Cancer - Other Maternal Grandfather        stomach cancer  . Cancer - Other Paternal Grandfather        Bone cancer   She denies any female cancers, bleeding or blood clotting disorders.  She denies any history of mental retardation, birth defects or genetic disorders in her or the FOB's history  Social History:  Social History   Socioeconomic History  . Marital status: Married    Spouse name: Not on file  . Number of children: Not on file  . Years of education: Not on file  . Highest education level: Not on file  Occupational History  . Not on file  Social Needs  . Financial resource strain: Not on file  . Food insecurity:    Worry: Not on file    Inability: Not on file  . Transportation needs:    Medical: Not on file    Non-medical: Not on file  Tobacco Use  . Smoking status: Never Smoker  . Smokeless tobacco: Never Used  Substance and Sexual Activity  . Alcohol use: No    Alcohol/week: 0.0 standard drinks  . Drug use: No  . Sexual activity: Yes  Lifestyle  . Physical activity:    Days per week: Not on file    Minutes per session: Not on file  . Stress: Not on file  Relationships  . Social connections:    Talks on phone: Not on file    Gets together: Not on file    Attends religious service: Not on file    Active member of club or organization: Not on file    Attends meetings of clubs or organizations:  Not on file    Relationship status: Not on file  . Intimate partner violence:    Fear of current or ex partner: Not on file    Emotionally abused: Not on file    Physically abused: Not on file    Forced sexual activity: Not on file  Other Topics Concern  . Not on file  Social History Narrative  . Not on file   Any pets in the household: no  Allergy: Allergies  Allergen Reactions  . Sulfa Antibiotics     Current Outpatient Medications:  Current Outpatient Medications:  .  FLUoxetine (PROZAC) 20 MG capsule, TAKE 4 CAPSULES BY MOUTH ONCE DAILY PT  TAKE  80  MG  ONCE  A  DAY, Disp: 120 capsule, Rfl: 0 .  zaleplon (SONATA) 10 MG capsule, Take 1 capsule (10 mg total) by mouth at bedtime as needed for sleep. (Patient not taking: Reported on 01/09/2019), Disp: 30 capsule, Rfl: 1  Current Facility-Administered Medications:  .  betamethasone acetate-betamethasone sodium phosphate (CELESTONE) injection 3 mg, 3 mg, Intramuscular, Once, Felecia Shelling, DPM  Objective: No exam today, due to telephone eVisit due to [facility] virus restriction on elective visits and procedures.  Prior visits reviewed along with ultrasounds/labs as indicated.   Assessment: Becky Mccoy is a 33 y.o. [id number] [redacted]w[redacted]d based on Patient's last menstrual period was 11/28/2018. with an Estimated Date of Delivery: 09/04/19,  for prenatal care.  Plan:  1) Avoid alcoholic beverages. 2) Patient encouraged not to smoke.  3) Discontinue the use of all non-medicinal drugs and chemicals.  4) Take prenatal vitamins daily.  5) Seatbelt use advised 6) Nutrition, food safety (fish, cheese advisories, and high nitrite foods) and exercise discussed. 7) Hospital and practice style delivering at [facility] discussed  8) Patient is asked about travel to areas at risk for the Zika virus, and counseled to avoid travel and exposure to mosquitoes or sexual partners who may have themselves been exposed to the virus. Testing is discussed, and will be  ordered as appropriate.  9) Childbirth classes at [facility] advised 10) Genetic Screening, such as with 1st Trimester Screening, cell free fetal DNA, AFP testing, and Ultrasound, as well as with amniocentesis and [facility] as appropriate, is discussed with patient. She plans to have not genetic testing this pregnancy. 11) [place] at 8 weeks; labs then too 12) Medications discussed. She will continue Prozac for her OCD, as risks without the medicine most  likely supercede risks of taking Prozac in pregnancy.  I discussed the assessment and treatment plan with the patient. The patient was provided an opportunity to ask questions and all were answered. The patient agreed with the plan and demonstrated an understanding of the instructions.   The patient was advised to call back or seek an in-person evaluation if the symptoms worsen or if the condition fails to improve as anticipated.  I provided 20 minutes of non-face-to-face time during this encounter.  Annamarie MajorPaul Oona Trammel, MD, Merlinda FrederickFACOG [facility], [facility] 01/09/2019  8:51 AM

## 2019-01-09 NOTE — Telephone Encounter (Signed)
Patient is schedule 01/10/19 with [facility]

## 2019-01-09 NOTE — Patient Instructions (Signed)
First Trimester of Pregnancy  The first trimester of pregnancy is from week 1 until the end of week 13 (months 1 through 3). A week after a sperm fertilizes an egg, the egg will implant on the wall of the uterus. This embryo will begin to develop into a baby. Genes from you and your partner will form the baby. The female genes will determine whether the baby will be a boy or a girl. At 6-8 weeks, the eyes and face will be formed, and the heartbeat can be seen on ultrasound. At the end of 12 weeks, all the baby's organs will be formed.  Now that you are pregnant, you will want to do everything you can to have a healthy baby. Two of the most important things are to get good prenatal care and to follow your health care provider's instructions. Prenatal care is all the medical care you receive before the baby's birth. This care will help prevent, find, and treat any problems during the pregnancy and childbirth.  Body changes during your first trimester  Your body goes through many changes during pregnancy. The changes vary from woman to woman.   You may gain or lose a couple of pounds at first.   You may feel sick to your stomach (nauseous) and you may throw up (vomit). If the vomiting is uncontrollable, call your health care provider.   You may tire easily.   You may develop headaches that can be relieved by medicines. All medicines should be approved by your health care provider.   You may urinate more often. Painful urination may mean you have a bladder infection.   You may develop heartburn as a result of your pregnancy.   You may develop constipation because certain hormones are causing the muscles that push stool through your intestines to slow down.   You may develop hemorrhoids or swollen veins (varicose veins).   Your breasts may begin to grow larger and become tender. Your nipples may stick out more, and the tissue that surrounds them (areola) may become darker.   Your gums may bleed and may be  sensitive to brushing and flossing.   Dark spots or blotches (chloasma, mask of pregnancy) may develop on your face. This will likely fade after the baby is born.   Your menstrual periods will stop.   You may have a loss of appetite.   You may develop cravings for certain kinds of food.   You may have changes in your emotions from day to day, such as being excited to be pregnant or being concerned that something may go wrong with the pregnancy and baby.   You may have more vivid and strange dreams.   You may have changes in your hair. These can include thickening of your hair, rapid growth, and changes in texture. Some women also have hair loss during or after pregnancy, or hair that feels dry or thin. Your hair will most likely return to normal after your baby is born.  What to expect at prenatal visits  During a routine prenatal visit:   You will be weighed to make sure you and the baby are growing normally.   Your blood pressure will be taken.   Your abdomen will be measured to track your baby's growth.   The fetal heartbeat will be listened to between weeks 10 and 14 of your pregnancy.   Test results from any previous visits will be discussed.  Your health care provider may ask you:     How you are feeling.   If you are feeling the baby move.   If you have had any abnormal symptoms, such as leaking fluid, bleeding, severe headaches, or abdominal cramping.   If you are using any tobacco products, including cigarettes, chewing tobacco, and electronic cigarettes.   If you have any questions.  Other tests that may be performed during your first trimester include:   Blood tests to find your blood type and to check for the presence of any previous infections. The tests will also be used to check for low iron levels (anemia) and protein on red blood cells (Rh antibodies). Depending on your risk factors, or if you previously had diabetes during pregnancy, you may have tests to check for high blood sugar  that affects pregnant women (gestational diabetes).   Urine tests to check for infections, diabetes, or protein in the urine.   An ultrasound to confirm the proper growth and development of the baby.   Fetal screens for spinal cord problems (spina bifida) and Down syndrome.   HIV (human immunodeficiency virus) testing. Routine prenatal testing includes screening for HIV, unless you choose not to have this test.   You may need other tests to make sure you and the baby are doing well.  Follow these instructions at home:  Medicines   Follow your health care provider's instructions regarding medicine use. Specific medicines may be either safe or unsafe to take during pregnancy.   Take a prenatal vitamin that contains at least 600 micrograms (mcg) of folic acid.   If you develop constipation, try taking a stool softener if your health care provider approves.  Eating and drinking     Eat a balanced diet that includes fresh fruits and vegetables, whole grains, good sources of protein such as meat, eggs, or tofu, and low-fat dairy. Your health care provider will help you determine the amount of weight gain that is right for you.   Avoid raw meat and uncooked cheese. These carry germs that can cause birth defects in the baby.   Eating four or five small meals rather than three large meals a day may help relieve nausea and vomiting. If you start to feel nauseous, eating a few soda crackers can be helpful. Drinking liquids between meals, instead of during meals, also seems to help ease nausea and vomiting.   Limit foods that are high in fat and processed sugars, such as fried and sweet foods.   To prevent constipation:  ? Eat foods that are high in fiber, such as fresh fruits and vegetables, whole grains, and beans.  ? Drink enough fluid to keep your urine clear or pale yellow.  Activity   Exercise only as directed by your health care provider. Most women can continue their usual exercise routine during  pregnancy. Try to exercise for 30 minutes at least 5 days a week. Exercising will help you:  ? Control your weight.  ? Stay in shape.  ? Be prepared for labor and delivery.   Experiencing pain or cramping in the lower abdomen or lower back is a good sign that you should stop exercising. Check with your health care provider before continuing with normal exercises.   Try to avoid standing for long periods of time. Move your legs often if you must stand in one place for a long time.   Avoid heavy lifting.   Wear low-heeled shoes and practice good posture.   You may continue to have sex unless your health care   provider tells you not to.  Relieving pain and discomfort   Wear a good support bra to relieve breast tenderness.   Take warm sitz baths to soothe any pain or discomfort caused by hemorrhoids. Use hemorrhoid cream if your health care provider approves.   Rest with your legs elevated if you have leg cramps or low back pain.   If you develop varicose veins in your legs, wear support hose. Elevate your feet for 15 minutes, 3-4 times a day. Limit salt in your diet.  Prenatal care   Schedule your prenatal visits by the twelfth week of pregnancy. They are usually scheduled monthly at first, then more often in the last 2 months before delivery.   Write down your questions. Take them to your prenatal visits.   Keep all your prenatal visits as told by your health care provider. This is important.  Safety   Wear your seat belt at all times when driving.   Make a list of emergency phone numbers, including numbers for family, friends, the hospital, and police and fire departments.  General instructions   Ask your health care provider for a referral to a local prenatal education class. Begin classes no later than the beginning of month 6 of your pregnancy.   Ask for help if you have counseling or nutritional needs during pregnancy. Your health care provider can offer advice or refer you to specialists for help  with various needs.   Do not use hot tubs, steam rooms, or saunas.   Do not douche or use tampons or scented sanitary pads.   Do not cross your legs for long periods of time.   Avoid cat litter boxes and soil used by cats. These carry germs that can cause birth defects in the baby and possibly loss of the fetus by miscarriage or stillbirth.   Avoid all smoking, herbs, alcohol, and medicines not prescribed by your health care provider. Chemicals in these products affect the formation and growth of the baby.   Do not use any products that contain nicotine or tobacco, such as cigarettes and e-cigarettes. If you need help quitting, ask your health care provider. You may receive counseling support and other resources to help you quit.   Schedule a dentist appointment. At home, brush your teeth with a soft toothbrush and be gentle when you floss.  Contact a health care provider if:   You have dizziness.   You have mild pelvic cramps, pelvic pressure, or nagging pain in the abdominal area.   You have persistent nausea, vomiting, or diarrhea.   You have a bad smelling vaginal discharge.   You have pain when you urinate.   You notice increased swelling in your face, hands, legs, or ankles.   You are exposed to fifth disease or chickenpox.   You are exposed to German measles (rubella) and have never had it.  Get help right away if:   You have a fever.   You are leaking fluid from your vagina.   You have spotting or bleeding from your vagina.   You have severe abdominal cramping or pain.   You have rapid weight gain or loss.   You vomit blood or material that looks like coffee grounds.   You develop a severe headache.   You have shortness of breath.   You have any kind of trauma, such as from a fall or a car accident.  Summary   The first trimester of pregnancy is from week 1 until   the end of week 13 (months 1 through 3).   Your body goes through many changes during pregnancy. The changes vary from  woman to woman.   You will have routine prenatal visits. During those visits, your health care provider will examine you, discuss any test results you may have, and talk with you about how you are feeling.  This information is not intended to replace advice given to you by your health care provider. Make sure you discuss any questions you have with your health care provider.  Document Released: 07/25/2001 Document Revised: 07/12/2016 Document Reviewed: 07/12/2016  Elsevier Interactive Patient Education  2019 [employer]

## 2019-01-09 NOTE — Telephone Encounter (Signed)
Pt calling; 6wks preg; has started bleeding this afternoon.  [phone]  Pt states it is more like a period flow.  Come in or go to ED.

## 2019-01-09 NOTE — Telephone Encounter (Signed)
Please schedule for tomorrow AM with [facility]

## 2019-01-10 ENCOUNTER — Other Ambulatory Visit (HOSPITAL_COMMUNITY)
Admission: RE | Admit: 2019-01-10 | Discharge: 2019-01-10 | Disposition: A | Discharge status: Home / Self Care | Payer: Self-pay | Source: Ambulatory Visit | Attending: Obstetrics & Gynecology | Admitting: Obstetrics & Gynecology

## 2019-01-10 ENCOUNTER — Ambulatory Visit (INDEPENDENT_AMBULATORY_CARE_PROVIDER_SITE_OTHER): Payer: 59 | Admitting: Obstetrics & Gynecology

## 2019-01-10 ENCOUNTER — Encounter: Payer: Self-pay | Admitting: Obstetrics & Gynecology

## 2019-01-10 ENCOUNTER — Other Ambulatory Visit: Payer: Self-pay

## 2019-01-10 VITALS — BP 120/80 | Wt 166.0 lb

## 2019-01-10 DIAGNOSIS — Z113 Encounter for screening for infections with a predominantly sexual mode of transmission: Secondary | ICD-10-CM

## 2019-01-10 DIAGNOSIS — O200 Threatened abortion: Secondary | ICD-10-CM

## 2019-01-10 NOTE — Patient Instructions (Signed)
Vaginal Bleeding During Pregnancy, First Trimester ° °A small amount of bleeding from the vagina (spotting) is relatively common during early pregnancy. It usually stops on its own. Various things may cause bleeding or spotting during early pregnancy. Some bleeding may be related to the pregnancy, and some may not. In many cases, the bleeding is normal and is not a problem. However, bleeding can also be a sign of something serious. Be sure to tell your health care provider about any vaginal bleeding right away. °Some possible causes of vaginal bleeding during the first trimester include: °· Infection or inflammation of the cervix. °· Growths (polyps) on the cervix. °· Miscarriage or threatened miscarriage. °· Pregnancy tissue developing outside of the uterus (ectopic pregnancy). °· A mass of tissue developing in the uterus due to an egg being fertilized incorrectly (molar pregnancy). °Follow these instructions at home: °Activity °· Follow instructions from your health care provider about limiting your activity. Ask what activities are safe for you. °· If needed, make plans for someone to help with your regular activities. °· Do not have sex or orgasms until your health care provider says that this is safe. °General instructions °· Take over-the-counter and prescription medicines only as told by your health care provider. °· Pay attention to any changes in your symptoms. °· Do not use tampons or douche. °· Write down how many pads you use each day, how often you change pads, and how soaked (saturated) they are. °· If you pass any tissue from your vagina, save the tissue so you can show it to your health care provider. °· Keep all follow-up visits as told by your health care provider. This is important. °Contact a health care provider if: °· You have vaginal bleeding during any part of your pregnancy. °· You have cramps or labor pains. °· You have a fever. °Get help right away if: °· You have severe cramps in your  back or abdomen. °· You pass large clots or a large amount of tissue from your vagina. °· Your bleeding increases. °· You feel light-headed or weak, or you faint. °· You have chills. °· You are leaking fluid or have a gush of fluid from your vagina. °Summary °· A small amount of bleeding (spotting) from the vagina is relatively common during early pregnancy. °· Various things may cause bleeding or spotting in early pregnancy. °· Be sure to tell your health care provider about any vaginal bleeding right away. °This information is not intended to replace advice given to you by your health care provider. Make sure you discuss any questions you have with your health care provider. °Document Released: 05/10/2005 Document Revised: 11/02/2016 Document Reviewed: 11/02/2016 °Elsevier Interactive Patient Education © 2019 Elsevier Inc. ° °

## 2019-01-10 NOTE — Progress Notes (Signed)
ULTRASOUND REPORT  Location: [facility] Date of Service: 01/10/2019   Indications:Threatened AB Findings:  Singleton intrauterine pregnancy is visualized with a CRL consistent with [redacted]w[redacted]d gestation, giving an (U/S) EDD of 09/04/2019. The (U/S) EDD is consistent with the clinically established EDD of 09/04/2019.  FHR: 120 BPM CRL measurement: 4.2 mm Yolk sac is visualized and appears normal and early anatomy is normal.  3.1 mm. Amnion: vizualized and appears abnormal  Concern for adjacent clot separate from intra-amniotic cavity w above fetal pole and yolk sac  Right Ovary is normal in appearance. Left Ovary is normal appearance. Corpus luteal cyst:  is not visualized Survey of the adnexa demonstrates no adnexal masses. There is no free peritoneal fluid in the cul de sac.  Impression: 1. [redacted]w[redacted]d Viable Singleton Intrauterine pregnancy by U/S. 2. (U/S) EDD is consistent with Clinically established EDD of 09/04/19.  Recommendations: 1.Clinical correlation with the patient's History and Physical Exam. 2. Beta hCG level and monitoring of sx's, repeat US in 10-14 days or as needed based on sx's.  Letitia Libra, MD

## 2019-01-10 NOTE — Progress Notes (Signed)
Chief Complaint  Patient presents with  . Vaginal Bleeding   History of Present Illness: Patient is a 33 y.o. [id number] [redacted]w[redacted]d presenting for first trimester bleeding.  The onset of bleeding was yesterday at 1400 and was heavy at first.  More dark blood this am.  Is bleeding equal to or greater than normal menstrual flow:  No Any recent trauma:  No Recent intercourse:  No History of prior miscarriage:  Yes Prior ultrasound demonstrating IUP:  No Prior ultrasound demonstrating viable IUP:  No Prior Serum HCG:  No Rh status: unk  PMHx: She  has a past medical history of History of chicken pox, UTI (urinary tract infection), Obsessive-compulsive and related disorder, and OCD (obsessive compulsive disorder). Also,  has a past surgical history that includes Wisdom tooth extraction., family history includes Cancer - Other in her maternal grandfather and paternal grandfather; Healthy in her father and mother.,  reports that she has never smoked. She has never used smokeless tobacco. She reports that she does not drink alcohol or use drugs.  She has a current medication list which includes the following prescription(s): fluoxetine and zaleplon, and the following Facility-Administered Medications: betamethasone acetate-betamethasone sodium phosphate. Also, is allergic to sulfa antibiotics.  Review of Systems  Constitutional: Negative for chills, fever and malaise/fatigue.  HENT: Negative for congestion, sinus pain and sore throat.   Eyes: Negative for blurred vision and pain.  Respiratory: Negative for cough and wheezing.   Cardiovascular: Negative for chest pain and leg swelling.  Gastrointestinal: Negative for abdominal pain, constipation, diarrhea, heartburn, nausea and vomiting.  Genitourinary: Negative for dysuria, frequency, hematuria and urgency.  Musculoskeletal: Negative for back pain, joint pain, myalgias and neck pain.  Skin: Negative for itching and rash.  Neurological: Negative for  dizziness, tremors and weakness.  Endo/Heme/Allergies: Does not bruise/bleed easily.  Psychiatric/Behavioral: Negative for depression. The patient is not nervous/anxious and does not have insomnia.     Objective: Vitals:   01/10/19 1127  BP: 120/80   Physical Exam Constitutional:      General: She is not in acute distress.    Appearance: She is well-developed.  Genitourinary:     Pelvic exam was performed with patient supine.     Vagina normal.     No vaginal erythema or bleeding.     No cervical motion tenderness, discharge, polyp or nabothian cyst.     Uterus is enlarged and mobile.     No uterine mass detected.    Uterus is midaxial.     No right or left adnexal mass present.     Right adnexa not tender.     Left adnexa not tender.     Genitourinary Comments: Old blood in vagina Cervix closed Uterus 6 weeks size No adnexal mass  HENT:     Head: Normocephalic and atraumatic.     Nose: Nose normal.  Abdominal:     General: There is no distension.     Palpations: Abdomen is soft.     Tenderness: There is no abdominal tenderness.  Musculoskeletal: Normal range of motion.  Neurological:     Mental Status: She is alert and oriented to person, place, and time.     Cranial Nerves: No cranial nerve deficit.  Skin:    General: Skin is warm and dry.     Assessment: 33 y.o. [id number] [redacted]w[redacted]d 1. Screen for STD (sexually transmitted disease) - Cervicovaginal ancillary only  2. Threatened abortion in early pregnancy - Beta hCG quant (ref lab) -  Also ck Rh status   Plan: 1) First trimester bleeding - incidence and clinical course of first trimester bleeding is discussed in detail with the patient today.  Approximately 1/3 of pregnancies ending in live births experienced 1st trimester bleeding.  The amount of bleeding is variable and not necessarily predictive of outcome.  Sources may be cervical or uterine.  Subchorionic hemorrhages are a frequent concurrent findings on  ultrasound and are followed expectantly.  These often absorb or regress spontaneously although risk for expansion and further disruption of the utero-placental interface leading to miscarriage is possible.  There is no clearly documented benefit to limiting or modifying activity and sexual intercourse in altering clinic course of 1st trimester bleeding.    2) If not already done will proceed with TVUS evaluation to document viability, and if uncertain viability or absence of a demonstrable IUP (and no previous documentation of IUP) will trend HCG levels. US done, see note by self. Beta today.  3) The patient is Rh uncertain, rhogam is therefore uncertain indicated to decrease the risk rhesus alloimmunization.  Check today  4) Routine bleeding precautions were discussed with the patient prior the conclusion of today's visit.  Annamarie MajorPaul Demonica Farrey, MD, Merlinda FrederickFACOG [facility], [facility] 01/10/2019  11:45 AM

## 2019-01-11 LAB — ABO AND RH: Rh Factor: NEGATIVE

## 2019-01-11 LAB — BETA HCG QUANT (REF LAB): hCG Quant: 22856 m[IU]/mL

## 2019-01-11 LAB — ANTIBODY SCREEN: Antibody Screen: NEGATIVE

## 2019-01-13 ENCOUNTER — Other Ambulatory Visit: Payer: Self-pay

## 2019-01-13 ENCOUNTER — Ambulatory Visit (INDEPENDENT_AMBULATORY_CARE_PROVIDER_SITE_OTHER): Payer: 59

## 2019-01-13 DIAGNOSIS — Z6791 Unspecified blood type, Rh negative: Secondary | ICD-10-CM

## 2019-01-13 DIAGNOSIS — O26891 Other specified pregnancy related conditions, first trimester: Secondary | ICD-10-CM

## 2019-01-13 MED ORDER — RHO D IMMUNE GLOBULIN 1500 UNIT/2ML IJ SOSY
300.0000 ug | PREFILLED_SYRINGE | Freq: Once | INTRAMUSCULAR | Status: AC
  Administered 2019-01-13: 300 ug via INTRAMUSCULAR

## 2019-01-13 NOTE — Progress Notes (Signed)
Schedule pt to come by for nurse visit INJECTION of RHOGAM today please; discussed w pt and she is aware.

## 2019-01-13 NOTE — Progress Notes (Signed)
Pt here for Rhogam d/t RH-  Given RUOQ

## 2019-01-14 LAB — CERVICOVAGINAL ANCILLARY ONLY
Chlamydia: NEGATIVE
Neisseria Gonorrhea: NEGATIVE

## 2019-01-20 ENCOUNTER — Telehealth: Payer: Self-pay | Admitting: Psychiatry

## 2019-01-20 ENCOUNTER — Other Ambulatory Visit: Payer: Self-pay

## 2019-01-20 DIAGNOSIS — F429 Obsessive-compulsive disorder, unspecified: Secondary | ICD-10-CM

## 2019-01-20 MED ORDER — FLUOXETINE HCL 20 MG PO CAPS: ORAL_CAPSULE | ORAL | 2 refills | Status: DC

## 2019-01-20 NOTE — Telephone Encounter (Signed)
Refills sent until appt 04/2019

## 2019-01-20 NOTE — Telephone Encounter (Signed)
Patient need refill on Prozac to be sent to [facility] in [facility]

## 2019-01-22 ENCOUNTER — Ambulatory Visit (INDEPENDENT_AMBULATORY_CARE_PROVIDER_SITE_OTHER): Payer: 59 | Admitting: Maternal Newborn

## 2019-01-22 ENCOUNTER — Encounter: Payer: Self-pay | Admitting: Maternal Newborn

## 2019-01-22 ENCOUNTER — Other Ambulatory Visit: Payer: Self-pay | Admitting: Obstetrics & Gynecology

## 2019-01-22 ENCOUNTER — Other Ambulatory Visit: Payer: Self-pay

## 2019-01-22 ENCOUNTER — Ambulatory Visit (INDEPENDENT_AMBULATORY_CARE_PROVIDER_SITE_OTHER): Payer: 59

## 2019-01-22 VITALS — BP 122/74 | Wt 166.0 lb

## 2019-01-22 DIAGNOSIS — Z3490 Encounter for supervision of normal pregnancy, unspecified, unspecified trimester: Secondary | ICD-10-CM

## 2019-01-22 DIAGNOSIS — Z3687 Encounter for antenatal screening for uncertain dates: Secondary | ICD-10-CM | POA: Diagnosis not present

## 2019-01-22 DIAGNOSIS — N926 Irregular menstruation, unspecified: Secondary | ICD-10-CM

## 2019-01-22 NOTE — Progress Notes (Signed)
No vb. No lof. U/s today.  

## 2019-01-22 NOTE — Patient Instructions (Signed)
First Trimester of Pregnancy  The first trimester of pregnancy is from week 1 until the end of week 13 (months 1 through 3). A week after a sperm fertilizes an egg, the egg will implant on the wall of the uterus. This embryo will begin to develop into a baby. Genes from you and your partner will form the baby. The female genes will determine whether the baby will be a boy or a girl. At 6-8 weeks, the eyes and face will be formed, and the heartbeat can be seen on ultrasound. At the end of 12 weeks, all the baby's organs will be formed.  Now that you are pregnant, you will want to do everything you can to have a healthy baby. Two of the most important things are to get good prenatal care and to follow your health care provider's instructions. Prenatal care is all the medical care you receive before the baby's birth. This care will help prevent, find, and treat any problems during the pregnancy and childbirth.  Body changes during your first trimester  Your body goes through many changes during pregnancy. The changes vary from woman to woman.   You may gain or lose a couple of pounds at first.   You may feel sick to your stomach (nauseous) and you may throw up (vomit). If the vomiting is uncontrollable, call your health care provider.   You may tire easily.   You may develop headaches that can be relieved by medicines. All medicines should be approved by your health care provider.   You may urinate more often. Painful urination may mean you have a bladder infection.   You may develop heartburn as a result of your pregnancy.   You may develop constipation because certain hormones are causing the muscles that push stool through your intestines to slow down.   You may develop hemorrhoids or swollen veins (varicose veins).   Your breasts may begin to grow larger and become tender. Your nipples may stick out more, and the tissue that surrounds them (areola) may become darker.   Your gums may bleed and may be  sensitive to brushing and flossing.   Dark spots or blotches (chloasma, mask of pregnancy) may develop on your face. This will likely fade after the baby is born.   Your menstrual periods will stop.   You may have a loss of appetite.   You may develop cravings for certain kinds of food.   You may have changes in your emotions from day to day, such as being excited to be pregnant or being concerned that something may go wrong with the pregnancy and baby.   You may have more vivid and strange dreams.   You may have changes in your hair. These can include thickening of your hair, rapid growth, and changes in texture. Some women also have hair loss during or after pregnancy, or hair that feels dry or thin. Your hair will most likely return to normal after your baby is born.  What to expect at prenatal visits  During a routine prenatal visit:   You will be weighed to make sure you and the baby are growing normally.   Your blood pressure will be taken.   Your abdomen will be measured to track your baby's growth.   The fetal heartbeat will be listened to between weeks 10 and 14 of your pregnancy.   Test results from any previous visits will be discussed.  Your health care provider may ask you:     How you are feeling.   If you are feeling the baby move.   If you have had any abnormal symptoms, such as leaking fluid, bleeding, severe headaches, or abdominal cramping.   If you are using any tobacco products, including cigarettes, chewing tobacco, and electronic cigarettes.   If you have any questions.  Other tests that may be performed during your first trimester include:   Blood tests to find your blood type and to check for the presence of any previous infections. The tests will also be used to check for low iron levels (anemia) and protein on red blood cells (Rh antibodies). Depending on your risk factors, or if you previously had diabetes during pregnancy, you may have tests to check for high blood sugar  that affects pregnant women (gestational diabetes).   Urine tests to check for infections, diabetes, or protein in the urine.   An ultrasound to confirm the proper growth and development of the baby.   Fetal screens for spinal cord problems (spina bifida) and Down syndrome.   HIV (human immunodeficiency virus) testing. Routine prenatal testing includes screening for HIV, unless you choose not to have this test.   You may need other tests to make sure you and the baby are doing well.  Follow these instructions at home:  Medicines   Follow your health care provider's instructions regarding medicine use. Specific medicines may be either safe or unsafe to take during pregnancy.   Take a prenatal vitamin that contains at least 600 micrograms (mcg) of folic acid.   If you develop constipation, try taking a stool softener if your health care provider approves.  Eating and drinking     Eat a balanced diet that includes fresh fruits and vegetables, whole grains, good sources of protein such as meat, eggs, or tofu, and low-fat dairy. Your health care provider will help you determine the amount of weight gain that is right for you.   Avoid raw meat and uncooked cheese. These carry germs that can cause birth defects in the baby.   Eating four or five small meals rather than three large meals a day may help relieve nausea and vomiting. If you start to feel nauseous, eating a few soda crackers can be helpful. Drinking liquids between meals, instead of during meals, also seems to help ease nausea and vomiting.   Limit foods that are high in fat and processed sugars, such as fried and sweet foods.   To prevent constipation:  ? Eat foods that are high in fiber, such as fresh fruits and vegetables, whole grains, and beans.  ? Drink enough fluid to keep your urine clear or pale yellow.  Activity   Exercise only as directed by your health care provider. Most women can continue their usual exercise routine during  pregnancy. Try to exercise for 30 minutes at least 5 days a week. Exercising will help you:  ? Control your weight.  ? Stay in shape.  ? Be prepared for labor and delivery.   Experiencing pain or cramping in the lower abdomen or lower back is a good sign that you should stop exercising. Check with your health care provider before continuing with normal exercises.   Try to avoid standing for long periods of time. Move your legs often if you must stand in one place for a long time.   Avoid heavy lifting.   Wear low-heeled shoes and practice good posture.   You may continue to have sex unless your health care   provider tells you not to.  Relieving pain and discomfort   Wear a good support bra to relieve breast tenderness.   Take warm sitz baths to soothe any pain or discomfort caused by hemorrhoids. Use hemorrhoid cream if your health care provider approves.   Rest with your legs elevated if you have leg cramps or low back pain.   If you develop varicose veins in your legs, wear support hose. Elevate your feet for 15 minutes, 3-4 times a day. Limit salt in your diet.  Prenatal care   Schedule your prenatal visits by the twelfth week of pregnancy. They are usually scheduled monthly at first, then more often in the last 2 months before delivery.   Write down your questions. Take them to your prenatal visits.   Keep all your prenatal visits as told by your health care provider. This is important.  Safety   Wear your seat belt at all times when driving.   Make a list of emergency phone numbers, including numbers for family, friends, the hospital, and police and fire departments.  General instructions   Ask your health care provider for a referral to a local prenatal education class. Begin classes no later than the beginning of month 6 of your pregnancy.   Ask for help if you have counseling or nutritional needs during pregnancy. Your health care provider can offer advice or refer you to specialists for help  with various needs.   Do not use hot tubs, steam rooms, or saunas.   Do not douche or use tampons or scented sanitary pads.   Do not cross your legs for long periods of time.   Avoid cat litter boxes and soil used by cats. These carry germs that can cause birth defects in the baby and possibly loss of the fetus by miscarriage or stillbirth.   Avoid all smoking, herbs, alcohol, and medicines not prescribed by your health care provider. Chemicals in these products affect the formation and growth of the baby.   Do not use any products that contain nicotine or tobacco, such as cigarettes and e-cigarettes. If you need help quitting, ask your health care provider. You may receive counseling support and other resources to help you quit.   Schedule a dentist appointment. At home, brush your teeth with a soft toothbrush and be gentle when you floss.  Contact a health care provider if:   You have dizziness.   You have mild pelvic cramps, pelvic pressure, or nagging pain in the abdominal area.   You have persistent nausea, vomiting, or diarrhea.   You have a bad smelling vaginal discharge.   You have pain when you urinate.   You notice increased swelling in your face, hands, legs, or ankles.   You are exposed to fifth disease or chickenpox.   You are exposed to German measles (rubella) and have never had it.  Get help right away if:   You have a fever.   You are leaking fluid from your vagina.   You have spotting or bleeding from your vagina.   You have severe abdominal cramping or pain.   You have rapid weight gain or loss.   You vomit blood or material that looks like coffee grounds.   You develop a severe headache.   You have shortness of breath.   You have any kind of trauma, such as from a fall or a car accident.  Summary   The first trimester of pregnancy is from week 1 until   the end of week 13 (months 1 through 3).   Your body goes through many changes during pregnancy. The changes vary from  woman to woman.   You will have routine prenatal visits. During those visits, your health care provider will examine you, discuss any test results you may have, and talk with you about how you are feeling.  This information is not intended to replace advice given to you by your health care provider. Make sure you discuss any questions you have with your health care provider.  Document Released: 07/25/2001 Document Revised: 07/12/2016 Document Reviewed: 07/12/2016  Elsevier Interactive Patient Education  2019 [employer]

## 2019-01-22 NOTE — Progress Notes (Signed)
    Routine Prenatal Care Visit  Subjective  Becky Mccoy is a 33 y.o. [phone] at [redacted]w[redacted]d being seen today for ongoing prenatal care.  She is currently monitored for the following issues for this low-risk pregnancy and has Thyroid fullness; OCD (obsessive compulsive disorder); Health care maintenance; Nipple discharge; Chest pain; Hypercholesterolemia; and Encounter for supervision of low-risk pregnancy, antepartum on their problem list.  ----------------------------------------------------------------------------------- Patient reports occasional mild nausea, has not vomited. Has not had any more episodes of vaginal bleeding. Vag. Bleeding: None.  ----------------------------------------------------------------------------------- The following portions of the patient's history were reviewed and updated as appropriate: allergies, current medications, past family history, past medical history, past social history, past surgical history and problem list. Problem list updated.   Objective  Blood pressure 122/74, weight 166 lb (75.3 kg), last menstrual period 11/28/2018. Pregravid weight 164 lb (74.4 kg) Total Weight Gain 2 lb (0.907 kg)  Fetal Status: Fetal Heart Rate (bpm): 167         General:  Alert, oriented and cooperative. Patient is in no acute distress.  Skin: Skin is warm and dry. No rash noted.   Cardiovascular: Normal heart rate noted  Respiratory: Normal respiratory effort, no problems with respiration noted  Abdomen: Soft, gravid, appropriate for gestational age. Pain/Pressure: Absent     Pelvic:  Cervical exam deferred        Extremities: Normal range of motion.     Mental Status: Normal mood and affect. Normal behavior. Normal judgment and thought content.     Assessment   33 y.o. [id number] at [redacted]w[redacted]d, EDD 09/04/2019 by Last Menstrual Period presenting for a routine prenatal visit.  Plan   pregnancy 4  Problems (from 11/28/18 to present)    No problems associated with  this episode.     Dating scan today shows singleton IUP at [redacted] weeks gestation, size equal to LMP dating, EDD will remain 09/04/2019. Subchorionic hemorrhage is present, measuring 3.7 cm. Findings reviewed with patient.   Does not desire any medication for nausea now, mild at this time.  Please refer to After Visit Summary for other counseling recommendations.   Return in about 4 weeks (around 02/19/2019) for ROB.  Avel Sensor, CNM 01/22/2019  2:37 PM

## 2019-01-24 LAB — RPR+RH+ABO+RUB AB+AB SCR+CB...
HIV Screen 4th Generation wRfx: NONREACTIVE
Hematocrit: 38.5 % (ref 34.0–46.6)
Hemoglobin: 13.1 g/dL (ref 11.1–15.9)
Hepatitis B Surface Ag: NEGATIVE
MCH: 30.3 pg (ref 26.6–33.0)
MCHC: 34 g/dL (ref 31.5–35.7)
MCV: 89 fL (ref 79–97)
Platelets: 360 10*3/uL (ref 150–450)
RBC: 4.32 x10E6/uL (ref 3.77–5.28)
RDW: 12.6 % (ref 11.7–15.4)
RPR Ser Ql: NONREACTIVE
Rh Factor: NEGATIVE
Rubella Antibodies, IGG: 1.49 index (ref >0.99)
Varicella zoster IgG: 1072 index (ref >165)
WBC: 11 10*3/uL — ABNORMAL HIGH (ref 3.4–10.8)

## 2019-01-24 LAB — URINE DRUG PANEL 7
Amphetamines, Urine: NEGATIVE ng/mL
Barbiturate Quant, Ur: NEGATIVE ng/mL
Benzodiazepine Quant, Ur: NEGATIVE ng/mL
Cannabinoid Quant, Ur: NEGATIVE ng/mL
Cocaine (Metab.): NEGATIVE ng/mL
Opiate Quant, Ur: NEGATIVE ng/mL
PCP Quant, Ur: NEGATIVE ng/mL

## 2019-01-24 LAB — AB SCR+ANTIBODY ID: Antibody Screen: POSITIVE — AB

## 2019-01-25 LAB — URINE CULTURE

## 2019-02-04 ENCOUNTER — Telehealth: Payer: Self-pay

## 2019-02-04 NOTE — Telephone Encounter (Signed)
There is nothing we can really do different at this point and she has had her rhogam shot for the prior episode of bleeding already.  Monitor bleeding for now

## 2019-02-04 NOTE — Telephone Encounter (Signed)
Patient states she had bleeding d/t blood clot in her uterus a while ago & it stopped but has started again. Inquiring if normal. Cb#[phone].

## 2019-02-04 NOTE — Telephone Encounter (Signed)
Spoke w/patient. She states she had a dark blood clot about the size of a dime come out last night then this morning she had some thick dark mucus.

## 2019-02-04 NOTE — Telephone Encounter (Signed)
Patient aware.

## 2019-02-10 ENCOUNTER — Other Ambulatory Visit: Payer: Self-pay

## 2019-02-10 ENCOUNTER — Encounter: Payer: Self-pay | Admitting: Obstetrics and Gynecology

## 2019-02-10 ENCOUNTER — Ambulatory Visit (INDEPENDENT_AMBULATORY_CARE_PROVIDER_SITE_OTHER): Payer: 59 | Admitting: Obstetrics and Gynecology

## 2019-02-10 VITALS — BP 130/80 | Wt 163.0 lb

## 2019-02-10 DIAGNOSIS — O200 Threatened abortion: Secondary | ICD-10-CM

## 2019-02-10 DIAGNOSIS — Z3490 Encounter for supervision of normal pregnancy, unspecified, unspecified trimester: Secondary | ICD-10-CM

## 2019-02-10 DIAGNOSIS — Z3A10 10 weeks gestation of pregnancy: Secondary | ICD-10-CM

## 2019-02-10 NOTE — Progress Notes (Signed)
Routine Prenatal Care Visit  Subjective  Becky Mccoy is a 33 y.o. [phone] at [redacted]w[redacted]d being seen today for ongoing prenatal care.  She is currently monitored for the following issues for this low-risk pregnancy and has Thyroid fullness; OCD (obsessive compulsive disorder); Health care maintenance; Nipple discharge; Chest pain; Hypercholesterolemia; and Encounter for supervision of low-risk pregnancy, antepartum on their problem list.  ----------------------------------------------------------------------------------- Patient reports heavy vaginal bleeding over the weekend.  She has a [facility] that was seen on a prior US. She reports today that the bleeding is significantly less. Contractions: Not present. Vag. Bleeding: None.  Movement: Absent. Denies leaking of fluid.  ----------------------------------------------------------------------------------- The following portions of the patient's history were reviewed and updated as appropriate: allergies, current medications, past family history, past medical history, past social history, past surgical history and problem list. Problem list updated.   Objective  Blood pressure 130/80, weight 163 lb (73.9 kg), last menstrual period 11/28/2018. Pregravid weight 164 lb (74.4 kg) Total Weight Gain -1 lb (-0.454 kg) Urinalysis:      Fetal Status: Fetal Heart Rate (bpm): 169   Movement: Absent     General:  Alert, oriented and cooperative. Patient is in no acute distress.  Skin: Skin is warm and dry. No rash noted.   Cardiovascular: Normal heart rate noted  Respiratory: Normal respiratory effort, no problems with respiration noted  Abdomen: Soft, gravid, appropriate for gestational age. Pain/Pressure: Absent     Pelvic:  Cervical exam deferred       Declines.   Extremities: Normal range of motion.     Mental Status: Normal mood and affect. Normal behavior. Normal judgment and thought content.   Bedside abdominal US showed IUP with FHR 169. Fetus  seen moving and active.   Assessment   33 y.o. [id number] at [redacted]w[redacted]d by  09/04/2019, by Last Menstrual Period presenting for routine prenatal visit  Plan   pregnancy 4  Problems (from 11/28/18 to present)    Problem Noted Resolved   Encounter for supervision of low-risk pregnancy, antepartum 01/09/2019 by Gae Dry, MD No   Overview Addendum 01/22/2019  2:18 PM by Rexene Agent, [place] Prenatal Labs  Dating 8 wk [place] Blood type:     Genetic Screen declines Antibody:   Anatomic [place]  Rubella:   Varicella: @VZVIGG @  GTT  RPR:     Rhogam 6/1; 28 w [ ]  HBsAg:     TDaP vaccine            Flu Shot: HIV:     Baby Food                                GBS:   Contraception  Pap:05/2018  CBB  No   CS/VBAC n/a   Support Person Becky Mccoy (husband)               Gestational age appropriate obstetric precautions including but not limited to vaginal bleeding, contractions, leaking of fluid and fetal movement were reviewed in detail with the patient.    Advised pelvic rest and avoidance of strenuous exercise at this time until she is further along in gestational age and bleeding has resolved. She has received rhogam already this pregnancy.  Given note to avoid heavy lifting at work. 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   Return for ROB as  planned on the 8th per patient.  Natale Milchhristanna R Shemika Robbs MD [facility], [facility] 02/10/2019, 3:33 PM

## 2019-02-10 NOTE — Telephone Encounter (Signed)
Pt called after hour nurse as well as this am.  When she called the after hour nurse she was filling a pad in 71min.  This am she is still bleeding but now as heavy.  Pt scheduled for 2;50 today c CRS.

## 2019-02-10 NOTE — Progress Notes (Signed)
OB problem visit C/o this weekend had to change pads every 15 mins, bleeding has slowed sense this weekend, has been passing clots that seem like mucus that are long.

## 2019-02-19 ENCOUNTER — Ambulatory Visit (INDEPENDENT_AMBULATORY_CARE_PROVIDER_SITE_OTHER): Payer: 59 | Admitting: Obstetrics & Gynecology

## 2019-02-19 ENCOUNTER — Encounter: Payer: Self-pay | Admitting: Obstetrics & Gynecology

## 2019-02-19 ENCOUNTER — Other Ambulatory Visit: Payer: Self-pay

## 2019-02-19 VITALS — BP 110/60 | Wt 165.0 lb

## 2019-02-19 DIAGNOSIS — Z3A11 11 weeks gestation of pregnancy: Secondary | ICD-10-CM

## 2019-02-19 DIAGNOSIS — O418X10 Other specified disorders of amniotic fluid and membranes, first trimester, not applicable or unspecified: Secondary | ICD-10-CM

## 2019-02-19 DIAGNOSIS — O468X1 Other antepartum hemorrhage, first trimester: Secondary | ICD-10-CM

## 2019-02-19 DIAGNOSIS — Z3490 Encounter for supervision of normal pregnancy, unspecified, unspecified trimester: Secondary | ICD-10-CM

## 2019-02-19 LAB — POCT URINALYSIS DIPSTICK OB
Glucose, UA: NEGATIVE
POC,PROTEIN,UA: NEGATIVE

## 2019-02-19 NOTE — Addendum Note (Signed)
Addended by: Quintella Baton D on: 02/19/2019 02:23 PM   Modules accepted: Orders

## 2019-02-19 NOTE — Progress Notes (Signed)
  Subjective  No nausea She has had some vag bleeding, stopped 5 days ago    Prior h/o [facility] seen on last [place]    No pain    Has been on work restrictions  Objective  BP 110/60   Wt 165 lb (74.8 kg)   LMP 11/28/2018   BMI 26.63 kg/m  General: NAD Pumonary: no increased work of breathing Abdomen: gravid, non-tender Extremities: no edema Psychiatric: mood appropriate, affect full FHT 150s  Assessment  33 y.o. [id number] at [redacted]w[redacted]d by  09/04/2019, by Last Menstrual Period presenting for routine prenatal visit  Plan   Problem List Items Addressed This Visit    Encounter for supervision of low-risk pregnancy, antepartum   [redacted] weeks gestation of pregnancy      PNV   Subchorionic hematoma in first trimester, single or unspecified fetus        Monitor for bleeding, pain; risks of miscarriage discussed    RTW note w restrictions provided   Relevant Orders   US OB LESS THAN 14 WEEKS WITH OB TRANSVAGINAL For follow up, 2 weeks      pregnancy 4  Problems (from 11/28/18 to present)    Problem Noted Resolved   Encounter for supervision of low-risk pregnancy, antepartum 01/09/2019 by Gae Dry, MD No   Overview Addendum 02/19/2019  2:03 PM by Gae Dry, [place] Prenatal Labs  Dating 8 wk [place] Blood type: O/Negative/-- (06/10 1437)   Genetic Screen declines Antibody:Positive, See Final Results (06/10 1437)  Anatomic [place] planned Rubella: 1.49 (06/10 1437) Varicella: Imm  GTT 28 weeks RPR: Non Reactive (06/10 1437)   Rhogam 6/1; 28 w [ ]  HBsAg: Negative (06/10 1437)   TDaP vaccine 32 weeks Flu Shot: Oct 2020 planned HIV: Non Reactive (06/10 1437)   Baby Food  Breast                              GBS: 36 weeks  Contraception Unsure Pap:05/2018  CBB  No   CS/VBAC n/a   Support Person Quillian Quince (husband)               Barnett Applebaum, MD, [facility], [facility] 02/19/2019  2:13 PM

## 2019-02-19 NOTE — Patient Instructions (Signed)

## 2019-03-05 ENCOUNTER — Other Ambulatory Visit: Payer: Self-pay

## 2019-03-05 ENCOUNTER — Ambulatory Visit (INDEPENDENT_AMBULATORY_CARE_PROVIDER_SITE_OTHER): Payer: 59

## 2019-03-05 ENCOUNTER — Ambulatory Visit (INDEPENDENT_AMBULATORY_CARE_PROVIDER_SITE_OTHER): Payer: 59 | Admitting: Obstetrics and Gynecology

## 2019-03-05 ENCOUNTER — Encounter: Payer: Self-pay | Admitting: Obstetrics and Gynecology

## 2019-03-05 VITALS — BP 118/74 | Wt 167.0 lb

## 2019-03-05 DIAGNOSIS — O418X10 Other specified disorders of amniotic fluid and membranes, first trimester, not applicable or unspecified: Secondary | ICD-10-CM

## 2019-03-05 DIAGNOSIS — Z3A13 13 weeks gestation of pregnancy: Secondary | ICD-10-CM

## 2019-03-05 DIAGNOSIS — Z3490 Encounter for supervision of normal pregnancy, unspecified, unspecified trimester: Secondary | ICD-10-CM

## 2019-03-05 DIAGNOSIS — Z3481 Encounter for supervision of other normal pregnancy, first trimester: Secondary | ICD-10-CM

## 2019-03-05 DIAGNOSIS — O468X1 Other antepartum hemorrhage, first trimester: Secondary | ICD-10-CM | POA: Diagnosis not present

## 2019-03-05 NOTE — Progress Notes (Signed)
  Routine Prenatal Care Visit  Subjective  Becky Mccoy is a 33 y.o. [phone] at [redacted]w[redacted]d being seen today for ongoing prenatal care.  She is currently monitored for the following issues for this low-risk pregnancy and has Thyroid fullness; OCD (obsessive compulsive disorder); Health care maintenance; Nipple discharge; Chest pain; Hypercholesterolemia; and Encounter for supervision of low-risk pregnancy, antepartum on their problem list.  ----------------------------------------------------------------------------------- Patient reports no complaints.   Contractions: Not present. Vag. Bleeding: None.  Movement: Absent. Denies leaking of fluid.  ----------------------------------------------------------------------------------- The following portions of the patient's history were reviewed and updated as appropriate: allergies, current medications, past family history, past medical history, past social history, past surgical history and problem list. Problem list updated.   Objective  Blood pressure 118/74, weight 167 lb (75.8 kg), last menstrual period 11/28/2018. Pregravid weight 164 lb (74.4 kg) Total Weight Gain 3 lb (1.361 kg) Urinalysis: Urine Protein    Urine Glucose    Fetal Status: Fetal Heart Rate (bpm): 149   Movement: Absent     General:  Alert, oriented and cooperative. Patient is in no acute distress.  Skin: Skin is warm and dry. No rash noted.   Cardiovascular: Normal heart rate noted  Respiratory: Normal respiratory effort, no problems with respiration noted  Abdomen: Soft, gravid, appropriate for gestational age. Pain/Pressure: Absent     Pelvic:  Cervical exam deferred        Extremities: Normal range of motion.  Edema: None  Mental Status: Normal mood and affect. Normal behavior. Normal judgment and thought content.   Assessment   33 y.o. [id number] at [redacted]w[redacted]d by  09/04/2019, by Last Menstrual Period presenting for routine prenatal visit  Plan   pregnancy 4  Problems  (from 11/28/18 to present)    Problem Noted Resolved   Encounter for supervision of low-risk pregnancy, antepartum 01/09/2019 by Gae Dry, MD No   Overview Addendum 02/19/2019  2:03 PM by Gae Dry, MD    Clinic [facility] Prenatal Labs  Dating 8 wk [place] Blood type: O/Negative/-- (06/10 1437)   Genetic Screen declines Antibody:Positive, See Final Results (06/10 1437)  Anatomic [place]  Rubella: 1.49 (06/10 1437) Varicella: Imm  GTT  RPR: Non Reactive (06/10 1437)   Rhogam 6/1; 28 w [ ]  HBsAg: Negative (06/10 1437)   TDaP vaccine            Flu Shot: HIV: Non Reactive (06/10 1437)   Baby Food                                GBS:   Contraception  Pap:05/2018  CBB  No   CS/VBAC n/a   Support Person Quillian Quince (husband)               Preterm labor symptoms and general obstetric precautions including but not limited to vaginal bleeding, contractions, leaking of fluid and fetal movement were reviewed in detail with the patient. Please refer to After Visit Summary for other counseling recommendations.   Return in about 4 weeks (around 04/02/2019) for Routine Prenatal Appointment.  Prentice Docker, MD, [facility], [place] 03/05/2019 4:38 PM

## 2019-04-02 ENCOUNTER — Ambulatory Visit (INDEPENDENT_AMBULATORY_CARE_PROVIDER_SITE_OTHER): Payer: 59 | Admitting: Obstetrics & Gynecology

## 2019-04-02 ENCOUNTER — Other Ambulatory Visit: Payer: Self-pay

## 2019-04-02 ENCOUNTER — Encounter: Payer: Self-pay | Admitting: Obstetrics & Gynecology

## 2019-04-02 VITALS — BP 118/70 | Wt 167.0 lb

## 2019-04-02 DIAGNOSIS — Z3482 Encounter for supervision of other normal pregnancy, second trimester: Secondary | ICD-10-CM

## 2019-04-02 DIAGNOSIS — Z3A17 17 weeks gestation of pregnancy: Secondary | ICD-10-CM

## 2019-04-02 DIAGNOSIS — Z3490 Encounter for supervision of normal pregnancy, unspecified, unspecified trimester: Secondary | ICD-10-CM

## 2019-04-02 LAB — POCT URINALYSIS DIPSTICK OB
Glucose, UA: NEGATIVE
POC,PROTEIN,UA: NEGATIVE

## 2019-04-02 NOTE — Addendum Note (Signed)
Addended by: Quintella Baton D on: 04/02/2019 04:34 PM   Modules accepted: Orders

## 2019-04-02 NOTE — Patient Instructions (Signed)
Prenatal Ultrasound A prenatal ultrasound exam, also called a sonogram, is an imaging test that allows your health care provider to see your baby and placenta in the uterus. This is a safe and painless test that does not expose you or your baby to any X-rays, needles, or medicines. Prenatal ultrasounds are done using a handheld plastic device (transducer) that sends out sound waves (ultrasound). The sound waves reflect off your baby's bones and other tissues to create moving images on a computer screen. There are two types of prenatal ultrasound:  Transabdominal ultrasound. During this test, a transducer is placed on your belly and moved around. A routine transabdominal ultrasound is usually done between weeks 18 and 22 of pregnancy (standard ultrasound). It may also be done between weeks 13 and 14.  Transvaginal ultrasound. During this test, a transducer that is shaped like a wand is placed inside your vagina. This type of ultrasound is usually done during early pregnancy. Prenatal ultrasounds may be used to check:  How far along your pregnancy is (stage).  Your baby's development (gestational age).  The location and condition of the organ that supplies your baby with nourishment and oxygen (placenta).  Your baby's heart rate, position, and movements.  Your baby's approximate size and weight.  The amount of fluid surrounding your baby (amniotic fluid).  If you are carrying more than one baby.  Your baby's sex (if your baby is in a position that allows the sex organs to be seen, and if you choose to learn the sex at this time).  If there are any possible problems that require more testing, such as genetic problems.  If your pregnancy is forming outside your uterus (ectopic pregnancy). You may have other ultrasounds as needed at any point during your pregnancy. If your health care provider suspects a problem, you may also have a more detailed type of transabdominal ultrasound (advanced  ultrasound). What are the risks? Generally, this is a safe test. There are no known risks for you or your baby from a prenatal ultrasound. What happens before the test?  Before a transabdominal ultrasound, you may be asked to drink fluid 2 hours before the exam and avoid emptying your bladder. A full bladder helps the images show up more clearly.  Before a transvaginal ultrasound, you may be asked to empty your bladder before the exam.  Wear loose, comfortable clothing so it is easy to undress or expose your lower belly for the exam. What happens during the test? If you are having a transabdominal ultrasound:  You will lie on an exam table.  Your belly will be exposed.  Gel will be rubbed over your belly.  The transducer will be pressed on your belly and moved back and forth, through the gel. You may feel slight pressure, but there should not be any pain.  You may be asked to change your position.  You may hear sounds of blood flow and your baby's heartbeat. You may be able to see images of your baby on the computer screen. Your health care provider may measure your baby's head and other body parts, looking for normal development.  After the exam, the gel will be cleaned off, and you can replace your clothing. You will be able to empty your bladder after the exam is done. If you are having a transvaginal ultrasound:  You will change into a hospital gown or undress from the waist down and cover yourself with a paper sheet.  You will lie down on   an exam table with your feet in footrests (stirrups).  The transducer will be covered with a protective cover and lubricated.  The transducer will be inserted into your vagina.  You may hear sounds of blood flow and your baby's heartbeat. You may be able to see images of your baby on the computer screen.  After the exam, the transducer will be removed, and you can put your clothes back on. What can I expect after the test?  You can  drive yourself home and return to all your normal activities.  A health care provider trained in interpreting ultrasounds will review the images taken during your exam and send a report to your health care provider.  It is up to you to get your test results. Ask your health care provider, or the department that is doing the test, when your results will be ready. Questions to ask your health care provider  Why am I having this prenatal ultrasound?  What information will this exam provide?  How much does this exam cost? What costs will my insurance cover?  Can my partner or support person be with me during the exam?  When can I expect to get the results? Summary  A prenatal ultrasound is a safe and painless imaging exam that gives information about your pregnancy and your developing baby.  Transvaginal ultrasound exams are often done in early pregnancy. Standard transabdominal ultrasounds are typically done between 18 and 22 weeks of pregnancy. You may have other prenatal ultrasounds as needed.  This exam has no risks for you or your baby. After the exam, you can go home and return to all your usual activities. This information is not intended to replace advice given to you by your health care provider. Make sure you discuss any questions you have with your health care provider. Document Released: 10/03/2017 Document Revised: 11/22/2018 Document Reviewed: 10/03/2017 Elsevier Patient Education  2020 Elsevier Inc.  

## 2019-04-02 NOTE — Progress Notes (Signed)
  Subjective  Fetal Movement? yes Contractions? no Leaking Fluid? no Vaginal Bleeding? no No nausea Objective  BP 118/70   Wt 167 lb (75.8 kg)   LMP 11/28/2018   BMI 26.95 kg/m  General: NAD Pumonary: no increased work of breathing Abdomen: gravid, non-tender Extremities: no edema Psychiatric: mood appropriate, affect full  Assessment  33 y.o. [id number] at [redacted]w[redacted]d by  09/04/2019, by Last Menstrual Period presenting for routine prenatal visit  Plan   Problem List Items Addressed This Visit      Other   Encounter for supervision of low-risk pregnancy, antepartum    Other Visit Diagnoses    [redacted] weeks gestation of pregnancy    -  Primary      pregnancy 4  Problems (from 11/28/18 to present)    Problem Noted Resolved   Encounter for supervision of low-risk pregnancy, antepartum 01/09/2019 by Gae Dry, MD No   Overview Addendum 04/02/2019  4:23 PM by Gae Dry, MD    Clinic [facility] Prenatal Labs  Dating 8 wk [place] Blood type: O/Negative/-- (06/10 1437)   Genetic Screen declines Antibody:Positive, See Final Results (06/10 1437)  Anatomic [place]  Rubella: 1.49 (06/10 1437) Varicella: Imm  GTT  RPR: Non Reactive (06/10 1437)   Rhogam 6/1; 28 w [ ]  HBsAg: Negative (06/10 1437)   TDaP vaccine            Flu Shot:@work  HIV: Non Reactive (06/10 1437)   Baby Food                                GBS:   Contraception  Pap:05/2018  CBB  No   CS/VBAC n/a   Support Person Quillian Quince (husband)             [place] nv  Flu shot gv at work  Barnett Applebaum, MD, [place], [facility] 04/02/2019  4:31 PM

## 2019-04-14 ENCOUNTER — Other Ambulatory Visit: Payer: Self-pay | Admitting: Obstetrics & Gynecology

## 2019-04-14 ENCOUNTER — Encounter: Payer: Self-pay | Admitting: Obstetrics and Gynecology

## 2019-04-14 ENCOUNTER — Ambulatory Visit (INDEPENDENT_AMBULATORY_CARE_PROVIDER_SITE_OTHER): Payer: 59 | Admitting: Obstetrics and Gynecology

## 2019-04-14 ENCOUNTER — Ambulatory Visit (INDEPENDENT_AMBULATORY_CARE_PROVIDER_SITE_OTHER): Payer: 59

## 2019-04-14 ENCOUNTER — Other Ambulatory Visit: Payer: Self-pay

## 2019-04-14 VITALS — BP 120/60 | Wt 169.0 lb

## 2019-04-14 DIAGNOSIS — Z3689 Encounter for other specified antenatal screening: Secondary | ICD-10-CM

## 2019-04-14 DIAGNOSIS — Z3490 Encounter for supervision of normal pregnancy, unspecified, unspecified trimester: Secondary | ICD-10-CM

## 2019-04-14 DIAGNOSIS — Z363 Encounter for antenatal screening for malformations: Secondary | ICD-10-CM

## 2019-04-14 DIAGNOSIS — Z3482 Encounter for supervision of other normal pregnancy, second trimester: Secondary | ICD-10-CM

## 2019-04-14 DIAGNOSIS — Z3A19 19 weeks gestation of pregnancy: Secondary | ICD-10-CM

## 2019-04-14 NOTE — Progress Notes (Signed)
ROB/US C/o not feeling baby everyday  Work will give flu shot

## 2019-04-14 NOTE — Progress Notes (Signed)
    Routine Prenatal Care Visit  Subjective  Becky Mccoy is a 33 y.o. [phone] at [redacted]w[redacted]d being seen today for ongoing prenatal care.  She is currently monitored for the following issues for this low-risk pregnancy and has Thyroid fullness; OCD (obsessive compulsive disorder); Health care maintenance; Nipple discharge; Chest pain; Hypercholesterolemia; and Encounter for supervision of low-risk pregnancy, antepartum on their problem list.  ----------------------------------------------------------------------------------- Patient reports no complaints.   Contractions: Not present. Vag. Bleeding: None.  Movement: Present. Denies leaking of fluid.  ----------------------------------------------------------------------------------- The following portions of the patient's history were reviewed and updated as appropriate: allergies, current medications, past family history, past medical history, past social history, past surgical history and problem list. Problem list updated.   Objective  Blood pressure 120/60, weight 169 lb (76.7 kg), last menstrual period 11/28/2018. Pregravid weight 164 lb (74.4 kg) Total Weight Gain 5 lb (2.268 kg) Urinalysis:      Fetal Status: Fetal Heart Rate (bpm): 135   Movement: Present     General:  Alert, oriented and cooperative. Patient is in no acute distress.  Skin: Skin is warm and dry. No rash noted.   Cardiovascular: Normal heart rate noted  Respiratory: Normal respiratory effort, no problems with respiration noted  Abdomen: Soft, gravid, appropriate for gestational age. Pain/Pressure: Absent     Pelvic:  Cervical exam deferred        Extremities: Normal range of motion.  Edema: None  Mental Status: Normal mood and affect. Normal behavior. Normal judgment and thought content.     Assessment   33 y.o. [id number] at [redacted]w[redacted]d by  09/04/2019, by Last Menstrual Period presenting for routine prenatal visit  Plan   pregnancy 4  Problems (from 11/28/18 to  present)    Problem Noted Resolved   Encounter for supervision of low-risk pregnancy, antepartum 01/09/2019 by Gae Dry, MD No   Overview Addendum 04/02/2019  4:23 PM by Gae Dry, MD    Clinic [facility] Prenatal Labs  Dating 8 wk [place] Blood type: O/Negative/-- (06/10 1437)   Genetic Screen declines Antibody:Positive, See Final Results (06/10 1437)  Anatomic [place]  Rubella: 1.49 (06/10 1437) Varicella: Imm  GTT  RPR: Non Reactive (06/10 1437)   Rhogam 6/1; 28 w [ ]  HBsAg: Negative (06/10 1437)   TDaP vaccine            Flu Shot:@work  HIV: Non Reactive (06/10 1437)   Baby Food                                GBS:   Contraception  Pap:05/2018  CBB  No   CS/VBAC n/a   Support Person Quillian Quince (husband)               Gestational age appropriate obstetric precautions including but not limited to vaginal bleeding, contractions, leaking of fluid and fetal movement were reviewed in detail with the patient.    [place] today normal  Return in about 4 weeks (around 05/12/2019) for ROB.  Homero Fellers MD [facility], [facility] 04/14/2019, 4:44 PM

## 2019-04-28 ENCOUNTER — Other Ambulatory Visit: Payer: Self-pay

## 2019-04-28 ENCOUNTER — Ambulatory Visit (INDEPENDENT_AMBULATORY_CARE_PROVIDER_SITE_OTHER): Payer: 59 | Admitting: Psychiatry

## 2019-04-28 ENCOUNTER — Encounter: Payer: Self-pay | Admitting: Psychiatry

## 2019-04-28 DIAGNOSIS — F5105 Insomnia due to other mental disorder: Secondary | ICD-10-CM | POA: Diagnosis not present

## 2019-04-28 DIAGNOSIS — R69 Illness, unspecified: Secondary | ICD-10-CM | POA: Diagnosis not present

## 2019-04-28 DIAGNOSIS — F325 Major depressive disorder, single episode, in full remission: Secondary | ICD-10-CM | POA: Diagnosis not present

## 2019-04-28 DIAGNOSIS — F429 Obsessive-compulsive disorder, unspecified: Secondary | ICD-10-CM

## 2019-04-28 MED ORDER — FLUOXETINE HCL 20 MG PO CAPS: ORAL_CAPSULE | ORAL | 1 refills | Status: DC

## 2019-04-28 NOTE — Progress Notes (Signed)
Becky Mccoy [id number] November 10, 1985 33 y.o.   Virtual Visit via Telephone Note  I connected with pt by telephone and verified that I am speaking with the correct person using two identifiers.   I discussed the limitations, risks, security and privacy concerns of performing an evaluation and management service by telephone and the availability of in person appointments. I also discussed with the patient that there may be a patient responsible charge related to this service. The patient expressed understanding and agreed to proceed.  I discussed the assessment and treatment plan with the patient. The patient was provided an opportunity to ask questions and all were answered. The patient agreed with the plan and demonstrated an understanding of the instructions.   The patient was advised to call back or seek an in-person evaluation if the symptoms worsen or if the condition fails to improve as anticipated.  I provided 30 minutes of non-face-to-face time during this encounter. The call started at 4 and ended at 430. The patient was located at home and the provider was located office.  Subjective:   Patient ID:  Becky Mccoy is a [age] y.o. (DOB November 10, 1985) female.  Chief Complaint:  Chief Complaint  Patient presents with  . Follow-up    Medication Management and follow-up sleep and mood  . Other    OCD    HPI Becky Mccoy presents  today for follow-up of exacerbation of severe OCD of the scrupulosity type.  At  visit October 10, 2018.  Patient with a history of scrupulosity which relapsed after 4 months off Prozac.  She had excessive weight gain from both sertraline and then Prozac.  The Prozac was effective at 60 mg a day.  Her symptoms were very severe and she wanted the maximal effect as soon as possible.  Therefore we increased the fluoxetine to 80 mg a day and added Abilify 5 mg to try to speed up the response rate.  It is anticipated that that will be discontinued later.     She had a very good response at her follow-up in April.  She was encouraged and able to reduce the clonazepam if possible at night.  No other meds were changed at that time.  She stopped the Abilify since last here.  She stopped the clonazepam with mild insomnia.  Sleep is fine now.    Had period of time of worsening OCD lasting 6 weeks and then resolved.  Under control now.  Still there but manageable.  Taking the fluoxetine 80 consistently.  On it 5 1/2 mos but only gained 2#.  Is pregnant.  Having a boy and 22 weeks.  US was done repeatedly.  Placental bleed resolved.  Growth of baby looks good.  No glucose checks yet.   [facility]    Patient reports stable mood and denies depressed or irritable moods.  More myself now.  Patient denies any recent difficulty with anxiety.  Residual OCD manageable. Patient denies difficulty with sleep initiation or maintenance. Denies appetite disturbance.  Patient reports that energy and motivation have been good.  Patient denies any difficulty with concentration.  Patient denies any suicidal ideation.  Past Psychiatric Medication Trials: Fluoxetine 80 with benefit but weight gain, Trintellix, sertraline 250 caused side effects and weight gain and forgetfulness and sweating, Xanax was not effective, clonazepam was helpful for sleep and anxiety, Abilify 5 mg  Review of Systems:  Review of Systems  Constitutional: Negative for diaphoresis.  Neurological: Negative for tremors and  weakness.  Psychiatric/Behavioral: Negative for agitation, behavioral problems, confusion, decreased concentration, dysphoric mood, hallucinations, self-injury, sleep disturbance and suicidal ideas. The patient is not nervous/anxious and is not hyperactive.     Medications: I have reviewed the patient's current medications.  Current Outpatient Medications  Medication Sig Dispense Refill  . FLUoxetine (PROZAC) 20 MG capsule TAKE 4 CAPSULES BY MOUTH ONCE DAILY PT  TAKE  80  MG   ONCE  A  DAY 120 capsule 2  . prenatal vitamin w/FE, FA (PRENATAL 1 + 1) 27-1 MG TABS tablet Take 1 tablet by mouth daily at 12 noon.     Current Facility-Administered Medications  Medication Dose Route Frequency Provider Last Rate Last Dose  . betamethasone acetate-betamethasone sodium phosphate (CELESTONE) injection 3 mg  3 mg Intramuscular Once Felecia Shelling, DPM        Medication Side Effects: None  Allergies:  Allergies  Allergen Reactions  . Sulfa Antibiotics     Past Medical History:  Diagnosis Date  . History of chicken pox   . Hx: UTI (urinary tract infection)   . Obsessive-compulsive and related disorder   . OCD (obsessive compulsive disorder)     Family History  Problem Relation Age of Onset  . Healthy Mother   . Healthy Father   . Cancer - Other Maternal Grandfather        stomach cancer  . Cancer - Other Paternal Grandfather        Bone cancer    Social History   Socioeconomic History  . Marital status: Married    Spouse name: Not on file  . Number of children: Not on file  . Years of education: Not on file  . Highest education level: Not on file  Occupational History  . Not on file  Social Needs  . Financial resource strain: Not on file  . Food insecurity    Worry: Not on file    Inability: Not on file  . Transportation needs    Medical: Not on file    Non-medical: Not on file  Tobacco Use  . Smoking status: Never Smoker  . Smokeless tobacco: Never Used  Substance and Sexual Activity  . Alcohol use: No    Alcohol/week: 0.0 standard drinks  . Drug use: No  . Sexual activity: Yes  Lifestyle  . Physical activity    Days per week: Not on file    Minutes per session: Not on file  . Stress: Not on file  Relationships  . Social Musician on phone: Not on file    Gets together: Not on file    Attends religious service: Not on file    Active member of club or organization: Not on file    Attends meetings of clubs or  organizations: Not on file    Relationship status: Not on file  . Intimate partner violence    Fear of current or ex partner: Not on file    Emotionally abused: Not on file    Physically abused: Not on file    Forced sexual activity: Not on file  Other Topics Concern  . Not on file  Social History Narrative  . Not on file    Past Medical History, Surgical history, Social history, and Family history were reviewed and updated as appropriate.   Please see review of systems for further details on the patient's review from today.   Objective:   Physical Exam:  LMP 11/28/2018   Physical  Exam Neurological:     Mental Status: She is alert and oriented to person, place, and time.     Cranial Nerves: No dysarthria.  Psychiatric:        Attention and Perception: Attention normal. She does not perceive auditory hallucinations.        Mood and Affect: Mood is anxious.        Speech: Speech normal.        Behavior: Behavior is cooperative.        Thought Content: Thought content normal. Thought content is not paranoid or delusional. Thought content does not include homicidal or suicidal ideation. Thought content does not include homicidal or suicidal plan.        Cognition and Memory: Cognition and memory normal.        Judgment: Judgment normal.     Comments: Insight good.  OCD is under control with minimal anxiety.  She still has residual OCD but is managed.     Lab Review:     Component Value Date/Time   NA 137 06/18/2018 0832   NA 142 03/24/2014 1617   K 3.9 06/18/2018 0832   K 4.0 03/24/2014 1617   CL 105 06/18/2018 0832   CL 105 03/24/2014 1617   CO2 24 06/18/2018 0832   CO2 26 03/24/2014 1617   GLUCOSE 96 06/18/2018 0832   GLUCOSE 93 03/24/2014 1617   BUN 18 06/18/2018 0832   BUN 13 03/24/2014 1617   CREATININE 0.63 06/18/2018 0832   CREATININE 0.62 03/24/2014 1617   CALCIUM 9.1 06/18/2018 0832   CALCIUM 9.5 03/24/2014 1617   PROT 7.1 06/18/2018 0832   ALBUMIN 4.3  06/18/2018 0832   AST 14 06/18/2018 0832   ALT 16 06/18/2018 0832   ALKPHOS 55 06/18/2018 0832   BILITOT 0.6 06/18/2018 0832   GFRNONAA >60 08/03/2016 1426   GFRNONAA >60 03/24/2014 1617   GFRAA >60 08/03/2016 1426   GFRAA >60 03/24/2014 1617       Component Value Date/Time   WBC 11.0 (H) 01/22/2019 1437   WBC 8.4 06/18/2018 0832   RBC 4.32 01/22/2019 1437   RBC 4.59 06/18/2018 0832   HGB 13.1 01/22/2019 1437   HCT 38.5 01/22/2019 1437   PLT 360 01/22/2019 1437   MCV 89 01/22/2019 1437   MCV 91 03/24/2014 1617   MCH 30.3 01/22/2019 1437   MCH 30.1 08/03/2016 1426   MCHC 34.0 01/22/2019 1437   MCHC 34.1 06/18/2018 0832   RDW 12.6 01/22/2019 1437   RDW 12.9 03/24/2014 1617   LYMPHSABS 2.4 06/18/2018 0832   LYMPHSABS 2.0 03/24/2014 1617   MONOABS 0.4 06/18/2018 0832   MONOABS 0.4 03/24/2014 1617   EOSABS 0.3 06/18/2018 0832   EOSABS 0.1 03/24/2014 1617   BASOSABS 0.1 06/18/2018 0832   BASOSABS 0.0 03/24/2014 1617    No results found for: POCLITH, LITHIUM   No results found for: PHENYTOIN, PHENOBARB, VALPROATE, CBMZ   .res Assessment: Plan:    Obsessive-compulsive disorder, unspecified type  Major depression, single episode, in complete remission ([place])  Insomnia due to mental condition    Patient with a history of scrupulosity which relapsed after 4 months off Prozac.  She had excessive weight gain from both sertraline and then Prozac.  The Prozac was effective at 60 mg a day.  Her symptoms were very severe at the last visit and she wanted the maximal effect as soon as possible.  Therefore per her request we  Increased fluoxetine to 80 mg a  day which she has tolerated in the past.  We  also added about Abilify 5 mg to potentiate the antidepressant speed the recovery.    Fortunately that worked.  She was dramatically better in 5 weeks which is very unusual for OCD.   She was able to discontinue Abilify and clonazepam without unusual complications and the OCD has  remained under control.  We discussed at length taking high-dose Prozac during pregnancy.  She also plans to breast-feed.  We discussed that as well.  She understands the baby is exposed to the fluoxetine and will be during blast breast-feeding as well.  However her OCD has been so severe that she cannot imagine being untreated and being able to manage it.  She does not feel that therapy alone would be sufficient to manage her OCD and I would agree with her assessment.  We discussed the risks of withdrawal symptoms in the baby which are unlikely in the case of Prozac because of the long half-life and also because she plans to breast-feed.  We discussed that there may be unknown risks associated with taking Prozac in pregnancy.  Fortunately there is been no birth defect issues identified and there are none typically associated with SSRIs.  Discussed potential risk of breathing problems and the baby after delivery.  She agrees that the Prozac 80 mg is necessary and tolerated and it is effective.  Lower doses failed to work.  Therefore no med changes today  This was a 30 minute appointment and Greater than 50% of face to face time with patient was spent on counseling and coordination of care.   FU right after delivery bc had postpartum depression and OCD after prior delivery. FU mid-March  Meredith Staggersarey Cottle, MD, DFAPA  Please see After Visit Summary for patient specific instructions.  Future Appointments  Date Time Provider Department Center  05/12/2019  3:50 PM Natale MilchSchuman, Christanna R, MD [facility] None  06/24/2019  3:00 PM Dale DurhamScott, Charlene, MD [id number]    No orders of the defined types were placed in this encounter.     -------------------------------

## 2019-05-12 ENCOUNTER — Ambulatory Visit (INDEPENDENT_AMBULATORY_CARE_PROVIDER_SITE_OTHER): Payer: 59 | Admitting: Obstetrics and Gynecology

## 2019-05-12 ENCOUNTER — Other Ambulatory Visit: Payer: Self-pay

## 2019-05-12 VITALS — BP 108/62 | Wt 170.0 lb

## 2019-05-12 DIAGNOSIS — K219 Gastro-esophageal reflux disease without esophagitis: Secondary | ICD-10-CM

## 2019-05-12 DIAGNOSIS — O99612 Diseases of the digestive system complicating pregnancy, second trimester: Secondary | ICD-10-CM

## 2019-05-12 DIAGNOSIS — Z3490 Encounter for supervision of normal pregnancy, unspecified, unspecified trimester: Secondary | ICD-10-CM

## 2019-05-12 DIAGNOSIS — Z3A24 24 weeks gestation of pregnancy: Secondary | ICD-10-CM

## 2019-05-12 LAB — POCT URINALYSIS DIPSTICK OB: Glucose, UA: NEGATIVE

## 2019-05-12 MED ORDER — OMEPRAZOLE 40 MG PO CPDR: 40.0000 mg | DELAYED_RELEASE_CAPSULE | Freq: Every day | ORAL | 11 refills | Status: DC

## 2019-05-12 MED ORDER — SUCRALFATE 1 G PO TABS: 1.0000 g | ORAL_TABLET | Freq: Three times a day (TID) | ORAL | 11 refills | Status: DC

## 2019-05-12 NOTE — Progress Notes (Signed)
    Routine Prenatal Care Visit  Subjective  Becky Mccoy is a 33 y.o. [phone] at [redacted]w[redacted]d being seen today for ongoing prenatal care.  She is currently monitored for the following issues for this low-risk pregnancy and has Thyroid fullness; OCD (obsessive compulsive disorder); Health care maintenance; Nipple discharge; Chest pain; Hypercholesterolemia; and Encounter for supervision of low-risk pregnancy, antepartum on their problem list.  ----------------------------------------------------------------------------------- Patient reports acid reflux, early satiety and nausea/vomiting.   Contractions: Not present. Vag. Bleeding: None.  Movement: Present. Denies leaking of fluid.  ----------------------------------------------------------------------------------- The following portions of the patient's history were reviewed and updated as appropriate: allergies, current medications, past family history, past medical history, past social history, past surgical history and problem list. Problem list updated.   Objective  Blood pressure 108/62, weight 170 lb (77.1 kg), last menstrual period 11/28/2018. Pregravid weight 164 lb (74.4 kg) Total Weight Gain 6 lb (2.722 kg) Urinalysis:      Fetal Status: Fetal Heart Rate (bpm): 140 Fundal Height: 24 cm Movement: Present     General:  Alert, oriented and cooperative. Patient is in no acute distress.  Skin: Skin is warm and dry. No rash noted.   Cardiovascular: Normal heart rate noted  Respiratory: Normal respiratory effort, no problems with respiration noted  Abdomen: Soft, gravid, appropriate for gestational age. Pain/Pressure: Present     Pelvic:  Cervical exam deferred        Extremities: Normal range of motion.  Edema: None  Mental Status: Normal mood and affect. Normal behavior. Normal judgment and thought content.     Assessment   33 y.o. [id number] at [redacted]w[redacted]d by  09/04/2019, by Last Menstrual Period presenting for routine prenatal visit   Plan   pregnancy 4  Problems (from 11/28/18 to present)    Problem Noted Resolved   Encounter for supervision of low-risk pregnancy, antepartum 01/09/2019 by Gae Dry, MD No   Overview Addendum 04/14/2019  4:45 PM by Homero Fellers, [place] Prenatal Labs  Dating 8 wk [place] Blood type: O/Negative/-- (06/10 1437)   Genetic Screen declines Antibody:Positive, See Final Results (06/10 1437)  Anatomic [place] complete Rubella: 1.49 (06/10 1437) Varicella: Imm  GTT  RPR: Non Reactive (06/10 1437)   Rhogam 6/1; 28 w [ ]  HBsAg: Negative (06/10 1437)   TDaP vaccine            Flu Shot:@work  HIV: Non Reactive (06/10 1437)   Baby Food                                GBS:   Contraception  Pap:05/2018  CBB  No   CS/VBAC n/a   Support Person Quillian Quince (husband)               Gestational age appropriate obstetric precautions including but not limited to vaginal bleeding, contractions, leaking of fluid and fetal movement were reviewed in detail with the patient.    GERD- will trial Protonix / Carafate. Took Carafate before and did not like that medication. The pill was difficult to swallow.  If no improvement consider GI consult. Labs next visit.   Return in about 4 weeks (around 06/09/2019) for ROb in person, 28 wk labs/ 1hr GTT.  Homero Fellers MD [facility], [facility] 05/13/2019, 4:20 PM

## 2019-05-12 NOTE — Progress Notes (Signed)
ROB C/o nausea and vomiting, some pelvic pressure, and acid reflux Denies lof, no vb Good FM

## 2019-05-21 DIAGNOSIS — Z23 Encounter for immunization: Secondary | ICD-10-CM | POA: Diagnosis not present

## 2019-05-27 ENCOUNTER — Telehealth: Payer: Self-pay

## 2019-05-27 NOTE — Telephone Encounter (Signed)
Spoke w/patient. She explains cramps as feeling like period cramps across whole bottom of stomach. Denies lof, vg. Admits it did get better and she has had just a few this afternoon. Advised to remain well hydrated as dehydration can cause cramps/ctx. She will hydrate and monitor for continued/increased cramping and call back for advice/eval with any further concerns.

## 2019-05-27 NOTE — Telephone Encounter (Signed)
Patient is 26 wks and inquiring if cramping is normal. [phone]

## 2019-06-09 ENCOUNTER — Other Ambulatory Visit: Payer: Self-pay

## 2019-06-09 ENCOUNTER — Ambulatory Visit (INDEPENDENT_AMBULATORY_CARE_PROVIDER_SITE_OTHER): Payer: 59 | Admitting: Certified Nurse Midwife

## 2019-06-09 ENCOUNTER — Other Ambulatory Visit: Payer: 59

## 2019-06-09 VITALS — BP 112/70 | Wt 174.0 lb

## 2019-06-09 DIAGNOSIS — Z3A27 27 weeks gestation of pregnancy: Secondary | ICD-10-CM | POA: Diagnosis not present

## 2019-06-09 DIAGNOSIS — Z3490 Encounter for supervision of normal pregnancy, unspecified, unspecified trimester: Secondary | ICD-10-CM

## 2019-06-09 DIAGNOSIS — Z6791 Unspecified blood type, Rh negative: Secondary | ICD-10-CM

## 2019-06-09 DIAGNOSIS — O26892 Other specified pregnancy related conditions, second trimester: Secondary | ICD-10-CM | POA: Diagnosis not present

## 2019-06-09 LAB — POCT URINALYSIS DIPSTICK OB
Glucose, UA: NEGATIVE
POC,PROTEIN,UA: NEGATIVE

## 2019-06-09 MED ORDER — RHO D IMMUNE GLOBULIN 1500 UNIT/2ML IJ SOSY
300.0000 ug | PREFILLED_SYRINGE | Freq: Once | INTRAMUSCULAR | Status: AC
  Administered 2019-06-09: 300 ug via INTRAMUSCULAR

## 2019-06-09 NOTE — Progress Notes (Signed)
No problems.rj 

## 2019-06-10 LAB — 28 WEEKS RH-PANEL
Antibody Screen: NEGATIVE
Basophils Absolute: 0 10*3/uL (ref 0.0–0.2)
Basos: 0 %
EOS (ABSOLUTE): 0.1 10*3/uL (ref 0.0–0.4)
Eos: 1 %
Gestational Diabetes Screen: 129 mg/dL (ref 65–139)
HIV Screen 4th Generation wRfx: NONREACTIVE
Hematocrit: 34.1 % (ref 34.0–46.6)
Hemoglobin: 11.7 g/dL (ref 11.1–15.9)
Immature Grans (Abs): 0.3 10*3/uL — ABNORMAL HIGH (ref 0.0–0.1)
Immature Granulocytes: 2 %
Lymphocytes Absolute: 1.3 10*3/uL (ref 0.7–3.1)
Lymphs: 11 %
MCH: 30.4 pg (ref 26.6–33.0)
MCHC: 34.3 g/dL (ref 31.5–35.7)
MCV: 89 fL (ref 79–97)
Monocytes Absolute: 0.4 10*3/uL (ref 0.1–0.9)
Monocytes: 4 %
Neutrophils Absolute: 9.1 10*3/uL — ABNORMAL HIGH (ref 1.4–7.0)
Neutrophils: 82 %
Platelets: 228 10*3/uL (ref 150–450)
RBC: 3.85 x10E6/uL (ref 3.77–5.28)
RDW: 13 % (ref 11.7–15.4)
RPR Ser Ql: NONREACTIVE
WBC: 11.1 10*3/uL — ABNORMAL HIGH (ref 3.4–10.8)

## 2019-06-10 NOTE — Progress Notes (Signed)
WNL- released to mychart

## 2019-06-15 NOTE — Progress Notes (Signed)
ROB at 27wk4d: Good FM. 28 week labs today. Blood type O negative Wants to breast feed.  Contraception: Vasectomy Had flu vaccine at work Rhogam today after labs drawn [place] in 2 weeks  Dalia Heading, [place]

## 2019-06-16 ENCOUNTER — Telehealth: Payer: Self-pay

## 2019-06-16 NOTE — Telephone Encounter (Signed)
Pt calling c/o having a lot of pressure where pelvic bone is; hurts to roll over, put on shoes, to stand.  What to take?  [phone]  Pt states it feels like the bone is going to break.  If she is still it doesn't hurt.  Adv to be seen.  Tx'd to SP to sched appt. Pt aware if unable to wait for appt to go to ED and they would send her to L&D.

## 2019-06-17 ENCOUNTER — Other Ambulatory Visit: Payer: Self-pay

## 2019-06-17 ENCOUNTER — Ambulatory Visit (INDEPENDENT_AMBULATORY_CARE_PROVIDER_SITE_OTHER): Payer: 59 | Admitting: Maternal Newborn

## 2019-06-17 VITALS — BP 122/70 | Wt 174.0 lb

## 2019-06-17 DIAGNOSIS — O26893 Other specified pregnancy related conditions, third trimester: Secondary | ICD-10-CM

## 2019-06-17 DIAGNOSIS — Z3A28 28 weeks gestation of pregnancy: Secondary | ICD-10-CM

## 2019-06-17 DIAGNOSIS — R102 Pelvic and perineal pain: Secondary | ICD-10-CM

## 2019-06-17 NOTE — Progress Notes (Signed)
ROB C/o severe pelvic pressure/pain  Denies lof, no vb, Good FM

## 2019-06-17 NOTE — Progress Notes (Signed)
    Prenatal Problem Visit  Subjective  Becky Mccoy is a 33 y.o. [phone] at [redacted]w[redacted]d being seen today for ongoing prenatal care.  She is currently monitored for the following issues for this low-risk pregnancy and has Thyroid fullness; OCD (obsessive compulsive disorder); Health care maintenance; Nipple discharge; Chest pain; Hypercholesterolemia; and Encounter for supervision of low-risk pregnancy, antepartum on their problem list.  ----------------------------------------------------------------------------------- Patient reports severe pelvic girdle pain. 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 This has been present for about 5-6 weeks but has become increasingly painful over time. ----------------------------------------------------------------------------------- The following portions of the patient's history were reviewed and updated as appropriate: allergies, current medications, past family history, past medical history, past social history, past surgical history and problem list. Problem list updated.   Objective  Blood pressure 122/70, weight 174 lb (78.9 kg), last menstrual period 11/28/2018. Pregravid weight 164 lb (74.4 kg) Total Weight Gain 10 lb (4.536 kg)   General:  Alert, oriented and cooperative. Patient is in no acute distress.  Cardiovascular: Normal heart rate noted  Respiratory: Normal respiratory effort, no problems with respiration noted  Abdomen: Soft, gravid, appropriate for gestational age.       Pelvic:  Cervical exam deferred        Extremities: Normal range of motion.     Mental Status: Normal mood and affect. Normal behavior. Normal judgment and thought content.   Difficulty when standing up from sitting position and some gait changes.  Assessment   33 y.o. [id number] at [redacted]w[redacted]d, EDD 09/04/2019 by Last Menstrual  Period presenting for a routine prenatal visit.  Plan   pregnancy 4  Problems (from 11/28/18 to present)    Problem Noted Resolved   Encounter for supervision of low-risk pregnancy, antepartum 01/09/2019 by Gae Dry, MD No   Overview Addendum 06/15/2019  9:08 AM by Dalia Heading, [place] Prenatal Labs  Dating 8 wk [place] Blood type: O/Negative/-- (06/10 1437)   Genetic Screen declines Antibody:Positive, anti D after Rhogam  Anatomic [place] complete Rubella: 1.49 (06/10 1437) Varicella: Imm  GTT  RPR: Non Reactive (06/10 1437)   Rhogam 6/1; 28 w [x ]06/09/19 HBsAg: Negative (06/10 1437)   TDaP vaccine            Flu Shot:@work  HIV: Non Reactive (06/10 1437)   Baby Food      Breast                          GBS:   Contraception Vasectomy Pap:05/2018  CBB  No   CS/VBAC n/a   Support Person Quillian Quince (husband)            Discussed pelvic girdle pain and some measures to help with comfort; written materials provided.  Note for work was given, she will let us know if her employer requires other language to restrict lifting and turning patients.  Referral to PT sent.  Please refer to After Visit Summary for other counseling recommendations.   Keep scheduled ROB 06/24/2019  Avel Sensor, [place] 06/17/2019  3:45 PM

## 2019-06-24 ENCOUNTER — Ambulatory Visit (INDEPENDENT_AMBULATORY_CARE_PROVIDER_SITE_OTHER): Payer: 59 | Admitting: Internal Medicine

## 2019-06-24 ENCOUNTER — Other Ambulatory Visit: Payer: Self-pay

## 2019-06-24 ENCOUNTER — Ambulatory Visit (INDEPENDENT_AMBULATORY_CARE_PROVIDER_SITE_OTHER): Payer: 59 | Admitting: Obstetrics & Gynecology

## 2019-06-24 ENCOUNTER — Encounter: Payer: Self-pay | Admitting: Obstetrics & Gynecology

## 2019-06-24 VITALS — BP 110/62 | Wt 175.0 lb

## 2019-06-24 VITALS — BP 112/68 | HR 103 | Temp 97.7°F | Resp 16 | Wt 174.6 lb

## 2019-06-24 DIAGNOSIS — N6452 Nipple discharge: Secondary | ICD-10-CM

## 2019-06-24 DIAGNOSIS — F429 Obsessive-compulsive disorder, unspecified: Secondary | ICD-10-CM

## 2019-06-24 DIAGNOSIS — Z3490 Encounter for supervision of normal pregnancy, unspecified, unspecified trimester: Secondary | ICD-10-CM

## 2019-06-24 DIAGNOSIS — R69 Illness, unspecified: Secondary | ICD-10-CM | POA: Diagnosis not present

## 2019-06-24 DIAGNOSIS — E7800 Pure hypercholesterolemia, unspecified: Secondary | ICD-10-CM

## 2019-06-24 DIAGNOSIS — Z3A29 29 weeks gestation of pregnancy: Secondary | ICD-10-CM

## 2019-06-24 DIAGNOSIS — Z3483 Encounter for supervision of other normal pregnancy, third trimester: Secondary | ICD-10-CM

## 2019-06-24 DIAGNOSIS — Z0000 Encounter for general adult medical examination without abnormal findings: Secondary | ICD-10-CM | POA: Diagnosis not present

## 2019-06-24 NOTE — Progress Notes (Signed)
  Subjective  Fetal Movement? yes Contractions? no Leaking Fluid? no Vaginal Bleeding? no  Objective  BP 110/62   Wt 175 lb (79.4 kg)   LMP 11/28/2018   BMI 28.25 kg/m  General: NAD Pumonary: no increased work of breathing Abdomen: gravid, non-tender Extremities: no edema Psychiatric: mood appropriate, affect full  Assessment  33 y.o. [id number] at [redacted]w[redacted]d by  09/04/2019, by Last Menstrual Period presenting for routine prenatal visit  Plan   Problem List Items Addressed This Visit      Other   Encounter for supervision of low-risk pregnancy, antepartum    Other Visit Diagnoses    [redacted] weeks gestation of pregnancy    -  Primary      pregnancy 4  Problems (from 11/28/18 to present)    Problem Noted Resolved   Encounter for supervision of low-risk pregnancy, antepartum 01/09/2019 by Gae Dry, MD No   Overview Addendum 06/15/2019  9:08 AM by Dalia Heading, [place] Prenatal Labs  Dating 8 wk [place] Blood type: O/Negative/-- (06/10 1437)   Genetic Screen declines Antibody:Positive, anti D after Rhogam  Anatomic [place] complete Rubella: 1.49 (06/10 1437) Varicella: Imm  GTT  RPR: Non Reactive (06/10 1437)   Rhogam 6/1 and 06/09/19 HBsAg: Negative (06/10 1437)   TDaP vaccine            Flu Shot:@work  HIV: Non Reactive (06/10 1437)   Baby Food      Breast                          GBS: p  Contraception Vasectomy Pap:05/2018  CBB  No   CS/VBAC n/a   Support Person Quillian Quince (husband)             Discussed third trimester pointers   Barnett Applebaum, MD, [facility], [facility] 06/24/2019  4:17 PM

## 2019-06-24 NOTE — Progress Notes (Signed)
Patient ID: Becky Mccoy, female   DOB: [birth year], 33 y.o.   MRN: [id number]   Subjective:    Patient ID: Becky Mccoy, female    DOB: April 16, 1986, 33 y.o.   MRN: [id number]  HPI  Patient here for a scheduled physical exam.  She is pregnant - 29 weeks.  Is having pelvic girdle pain.  Had recommended therapy.  She is doing exercises at home.  Trying to stay active.  Taking her medication.  Feels she is doing well.  No chest pain.  No sob.  No acid reflux.  No abdominal pain.  Bowels moving.  Handling stress.    Past Medical History:  Diagnosis Date  . History of chicken pox   . Hx: UTI (urinary tract infection)   . Obsessive-compulsive and related disorder   . OCD (obsessive compulsive disorder)    Past Surgical History:  Procedure Laterality Date  . WISDOM TOOTH EXTRACTION     Family History  Problem Relation Age of Onset  . Healthy Mother   . Healthy Father   . Cancer - Other Maternal Grandfather        stomach cancer  . Cancer - Other Paternal Grandfather        Bone cancer   Social History   Socioeconomic History  . Marital status: Married    Spouse name: Not on file  . Number of children: Not on file  . Years of education: Not on file  . Highest education level: Not on file  Occupational History  . Not on file  Social Needs  . Financial resource strain: Not on file  . Food insecurity    Worry: Not on file    Inability: Not on file  . Transportation needs    Medical: Not on file    Non-medical: Not on file  Tobacco Use  . Smoking status: Never Smoker  . Smokeless tobacco: Never Used  Substance and Sexual Activity  . Alcohol use: No    Alcohol/week: 0.0 standard drinks  . Drug use: No  . Sexual activity: Yes  Lifestyle  . Physical activity    Days per week: Not on file    Minutes per session: Not on file  . Stress: Not on file  Relationships  . Social Herbalist on phone: Not on file    Gets together: Not on file    Attends  religious service: Not on file    Active member of club or organization: Not on file    Attends meetings of clubs or organizations: Not on file    Relationship status: Not on file  Other Topics Concern  . Not on file  Social History Narrative  . Not on file    Outpatient Encounter Medications as of 06/24/2019  Medication Sig  . FLUoxetine (PROZAC) 20 MG capsule TAKE 4 CAPSULES BY MOUTH ONCE DAILY PT  TAKE  80  MG  ONCE  A  DAY  . omeprazole (PRILOSEC) 40 MG capsule Take 1 capsule (40 mg total) by mouth daily.  . prenatal vitamin w/FE, FA (PRENATAL 1 + 1) 27-1 MG TABS tablet Take 1 tablet by mouth daily at 12 noon.  . sucralfate (CARAFATE) 1 g tablet Take 1 tablet (1 g total) by mouth 4 (four) times daily -  with meals and at bedtime. (Patient not taking: Reported on 06/09/2019)   No facility-administered encounter medications on file as of 06/24/2019.    Review of Systems  Constitutional: Negative  for appetite change and unexpected weight change.  HENT: Negative for congestion and sinus pressure.   Eyes: Negative for pain and visual disturbance.  Respiratory: Negative for cough, chest tightness and shortness of breath.   Cardiovascular: Negative for chest pain, palpitations and leg swelling.  Gastrointestinal: Negative for abdominal pain, diarrhea, nausea and vomiting.  Genitourinary: Negative for difficulty urinating and dysuria.  Musculoskeletal: Negative for joint swelling and myalgias.  Skin: Negative for color change and rash.  Neurological: Negative for dizziness, light-headedness and headaches.  Hematological: Negative for adenopathy. Does not bruise/bleed easily.  Psychiatric/Behavioral: Negative for agitation and dysphoric mood.       Objective:    Physical Exam Constitutional:      General: She is not in acute distress.    Appearance: Normal appearance.  HENT:     Head: Normocephalic and atraumatic.     Right Ear: External ear normal.     Left Ear: External ear  normal.  Eyes:     General: No scleral icterus.       Right eye: No discharge.        Left eye: No discharge.     Conjunctiva/sclera: Conjunctivae normal.  Neck:     Musculoskeletal: Neck supple. No muscular tenderness.     Thyroid: No thyromegaly.  Cardiovascular:     Rate and Rhythm: Normal rate and regular rhythm.  Pulmonary:     Effort: No respiratory distress.     Breath sounds: Normal breath sounds. No wheezing.  Abdominal:     General: Bowel sounds are normal.     Palpations: Abdomen is soft.     Tenderness: There is no abdominal tenderness.  Musculoskeletal:        General: No swelling or tenderness.  Lymphadenopathy:     Cervical: No cervical adenopathy.  Skin:    Findings: No erythema or rash.  Neurological:     Mental Status: She is alert.  Psychiatric:        Mood and Affect: Mood normal.        Behavior: Behavior normal.     BP 112/68   Pulse (!) 103   Temp 97.7 F (36.5 C)   Resp 16   Wt 174 lb 9.6 oz (79.2 kg)   LMP 11/28/2018   SpO2 98%   BMI 28.18 kg/m  Wt Readings from Last 3 Encounters:  06/24/19 175 lb (79.4 kg)  06/24/19 174 lb 9.6 oz (79.2 kg)  06/17/19 174 lb (78.9 kg)     Lab Results  Component Value Date   WBC 11.1 (H) 06/09/2019   HGB 11.7 06/09/2019   HCT 34.1 06/09/2019   PLT 228 06/09/2019   GLUCOSE 96 06/18/2018   CHOL 216 (H) 09/19/2018   TRIG 155.0 (H) 09/19/2018   HDL 32.60 (L) 09/19/2018   LDLDIRECT 120.0 06/18/2018   LDLCALC 152 (H) 09/19/2018   ALT 16 06/18/2018   AST 14 06/18/2018   NA 137 06/18/2018   K 3.9 06/18/2018   CL 105 06/18/2018   CREATININE 0.63 06/18/2018   BUN 18 06/18/2018   CO2 24 06/18/2018   TSH 1.95 06/18/2018       Assessment & Plan:   Problem List Items Addressed This Visit    Health care maintenance    Physical today.  Sees gyn.  [redacted] weeks pregnant.        Hypercholesterolemia    Low cholesterol diet and exercise.  Follow lipid panel.        Relevant Orders  CBC with  Differential/Platelet   Comprehensive metabolic panel   TSH   Lipid panel   Nipple discharge    Discussed with her today. Pregnant now.  States prior to pregnancy - not an issue.  Follow.       OCD (obsessive compulsive disorder)    On prozac.  Followed by psychiatry.  Doing well.            Dale Sanibel, MD

## 2019-06-24 NOTE — Patient Instructions (Signed)
Third Trimester of Pregnancy The third trimester is from week 28 through week 40 (months 7 through 9). The third trimester is a time when the unborn baby (fetus) is growing rapidly. At the end of the ninth month, the fetus is about 20 inches in length and weighs 6-10 pounds. Body changes during your third trimester Your body will continue to go through many changes during pregnancy. The changes vary from woman to woman. During the third trimester:  Your weight will continue to increase. You can expect to gain 25-35 pounds (11-16 kg) by the end of the pregnancy.  You may begin to get stretch marks on your hips, abdomen, and breasts.  You may urinate more often because the fetus is moving lower into your pelvis and pressing on your bladder.  You may develop or continue to have heartburn. This is caused by increased hormones that slow down muscles in the digestive tract.  You may develop or continue to have constipation because increased hormones slow digestion and cause the muscles that push waste through your intestines to relax.  You may develop hemorrhoids. These are swollen veins (varicose veins) in the rectum that can itch or be painful.  You may develop swollen, bulging veins (varicose veins) in your legs.  You may have increased body aches in the pelvis, back, or thighs. This is due to weight gain and increased hormones that are relaxing your joints.  You may have changes in your hair. These can include thickening of your hair, rapid growth, and changes in texture. Some women also have hair loss during or after pregnancy, or hair that feels dry or thin. Your hair will most likely return to normal after your baby is born.  Your breasts will continue to grow and they will continue to become tender. A yellow fluid (colostrum) may leak from your breasts. This is the first milk you are producing for your baby.  Your belly button may stick out.  You may notice more swelling in your hands,  face, or ankles.  You may have increased tingling or numbness in your hands, arms, and legs. The skin on your belly may also feel numb.  You may feel short of breath because of your expanding uterus.  You may have more problems sleeping. This can be caused by the size of your belly, increased need to urinate, and an increase in your body's metabolism.  You may notice the fetus "dropping," or moving lower in your abdomen (lightening).  You may have increased vaginal discharge.  You may notice your joints feel loose and you may have pain around your pelvic bone. What to expect at prenatal visits You will have prenatal exams every 2 weeks until week 36. Then you will have weekly prenatal exams. During a routine prenatal visit:  You will be weighed to make sure you and the baby are growing normally.  Your blood pressure will be taken.  Your abdomen will be measured to track your baby's growth.  The fetal heartbeat will be listened to.  Any test results from the previous visit will be discussed.  You may have a cervical check near your due date to see if your cervix has softened or thinned (effaced).  You will be tested for Group B streptococcus. This happens between 35 and 37 weeks. Your health care provider may ask you:  What your birth plan is.  How you are feeling.  If you are feeling the baby move.  If you have had any abnormal   symptoms, such as leaking fluid, bleeding, severe headaches, or abdominal cramping.  If you are using any tobacco products, including cigarettes, chewing tobacco, and electronic cigarettes.  If you have any questions. Other tests or screenings that may be performed during your third trimester include:  Blood tests that check for low iron levels (anemia).  Fetal testing to check the health, activity level, and growth of the fetus. Testing is done if you have certain medical conditions or if there are problems during the pregnancy.  Nonstress test  (NST). This test checks the health of your baby to make sure there are no signs of problems, such as the baby not getting enough oxygen. During this test, a belt is placed around your belly. The baby is made to move, and its heart rate is monitored during movement. What is false labor? False labor is a condition in which you feel small, irregular tightenings of the muscles in the womb (contractions) that usually go away with rest, changing position, or drinking water. These are called Braxton Hicks contractions. Contractions may last for hours, days, or even weeks before true labor sets in. If contractions come at regular intervals, become more frequent, increase in intensity, or become painful, you should see your health care provider. What are the signs of labor?  Abdominal cramps.  Regular contractions that start at 10 minutes apart and become stronger and more frequent with time.  Contractions that start on the top of the uterus and spread down to the lower abdomen and back.  Increased pelvic pressure and dull back pain.  A watery or bloody mucus discharge that comes from the vagina.  Leaking of amniotic fluid. This is also known as your "water breaking." It could be a slow trickle or a gush. Let your health care provider know if it has a color or strange odor. If you have any of these signs, call your health care provider right away, even if it is before your due date. Follow these instructions at home: Medicines  Follow your health care provider's instructions regarding medicine use. Specific medicines may be either safe or unsafe to take during pregnancy.  Take a prenatal vitamin that contains at least 600 micrograms (mcg) of folic acid.  If you develop constipation, try taking a stool softener if your health care provider approves. Eating and drinking   Eat a balanced diet that includes fresh fruits and vegetables, whole grains, good sources of protein such as meat, eggs, or tofu,  and low-fat dairy. Your health care provider will help you determine the amount of weight gain that is right for you.  Avoid raw meat and uncooked cheese. These carry germs that can cause birth defects in the baby.  If you have low calcium intake from food, talk to your health care provider about whether you should take a daily calcium supplement.  Eat four or five small meals rather than three large meals a day.  Limit foods that are high in fat and processed sugars, such as fried and sweet foods.  To prevent constipation: ? Drink enough fluid to keep your urine clear or pale yellow. ? Eat foods that are high in fiber, such as fresh fruits and vegetables, whole grains, and beans. Activity  Exercise only as directed by your health care provider. Most women can continue their usual exercise routine during pregnancy. Try to exercise for 30 minutes at least 5 days a week. Stop exercising if you experience uterine contractions.  Avoid heavy lifting.  Do   not exercise in extreme heat or humidity, or at high altitudes.  Wear low-heel, comfortable shoes.  Practice good posture.  You may continue to have sex unless your health care provider tells you otherwise. Relieving pain and discomfort  Take frequent breaks and rest with your legs elevated if you have leg cramps or low back pain.  Take warm sitz baths to soothe any pain or discomfort caused by hemorrhoids. Use hemorrhoid cream if your health care provider approves.  Wear a good support bra to prevent discomfort from breast tenderness.  If you develop varicose veins: ? Wear support pantyhose or compression stockings as told by your healthcare provider. ? Elevate your feet for 15 minutes, 3-4 times a day. Prenatal care  Write down your questions. Take them to your prenatal visits.  Keep all your prenatal visits as told by your health care provider. This is important. Safety  Wear your seat belt at all times when driving.  Make  a list of emergency phone numbers, including numbers for family, friends, the hospital, and police and fire departments. General instructions  Avoid cat litter boxes and soil used by cats. These carry germs that can cause birth defects in the baby. If you have a cat, ask someone to clean the litter box for you.  Do not travel far distances unless it is absolutely necessary and only with the approval of your health care provider.  Do not use hot tubs, steam rooms, or saunas.  Do not drink alcohol.  Do not use any products that contain nicotine or tobacco, such as cigarettes and e-cigarettes. If you need help quitting, ask your health care provider.  Do not use any medicinal herbs or unprescribed drugs. These chemicals affect the formation and growth of the baby.  Do not douche or use tampons or scented sanitary pads.  Do not cross your legs for long periods of time.  To prepare for the arrival of your baby: ? Take prenatal classes to understand, practice, and ask questions about labor and delivery. ? Make a trial run to the hospital. ? Visit the hospital and tour the maternity area. ? Arrange for maternity or paternity leave through employers. ? Arrange for family and friends to take care of pets while you are in the hospital. ? Purchase a rear-facing car seat and make sure you know how to install it in your car. ? Pack your hospital bag. ? Prepare the baby's nursery. Make sure to remove all pillows and stuffed animals from the baby's crib to prevent suffocation.  Visit your dentist if you have not gone during your pregnancy. Use a soft toothbrush to brush your teeth and be gentle when you floss. Contact a health care provider if:  You are unsure if you are in labor or if your water has broken.  You become dizzy.  You have mild pelvic cramps, pelvic pressure, or nagging pain in your abdominal area.  You have lower back pain.  You have persistent nausea, vomiting, or diarrhea.   You have an unusual or bad smelling vaginal discharge.  You have pain when you urinate. Get help right away if:  Your water breaks before 37 weeks.  You have regular contractions less than 5 minutes apart before 37 weeks.  You have a fever.  You are leaking fluid from your vagina.  You have spotting or bleeding from your vagina.  You have severe abdominal pain or cramping.  You have rapid weight loss or weight gain.  You have   shortness of breath with chest pain.  You notice sudden or extreme swelling of your face, hands, ankles, feet, or legs.  Your baby makes fewer than 10 movements in 2 hours.  You have severe headaches that do not go away when you take medicine.  You have vision changes. Summary  The third trimester is from week 28 through week 40, months 7 through 9. The third trimester is a time when the unborn baby (fetus) is growing rapidly.  During the third trimester, your discomfort may increase as you and your baby continue to gain weight. You may have abdominal, leg, and back pain, sleeping problems, and an increased need to urinate.  During the third trimester your breasts will keep growing and they will continue to become tender. A yellow fluid (colostrum) may leak from your breasts. This is the first milk you are producing for your baby.  False labor is a condition in which you feel small, irregular tightenings of the muscles in the womb (contractions) that eventually go away. These are called Braxton Hicks contractions. Contractions may last for hours, days, or even weeks before true labor sets in.  Signs of labor can include: abdominal cramps; regular contractions that start at 10 minutes apart and become stronger and more frequent with time; watery or bloody mucus discharge that comes from the vagina; increased pelvic pressure and dull back pain; and leaking of amniotic fluid. This information is not intended to replace advice given to you by your health  care provider. Make sure you discuss any questions you have with your health care provider. Document Released: 07/25/2001 Document Revised: 11/21/2018 Document Reviewed: 09/05/2016 Elsevier Patient Education  2020 [employer]  

## 2019-06-28 ENCOUNTER — Encounter: Payer: Self-pay | Admitting: Internal Medicine

## 2019-06-28 NOTE — Assessment & Plan Note (Signed)
Physical today.  Sees gyn.  [redacted] weeks pregnant.

## 2019-06-28 NOTE — Assessment & Plan Note (Signed)
Low cholesterol diet and exercise.  Follow lipid panel.   

## 2019-06-28 NOTE — Assessment & Plan Note (Signed)
On prozac.  Followed by psychiatry.  Doing well.

## 2019-06-28 NOTE — Assessment & Plan Note (Signed)
Discussed with her today. Pregnant now.  States prior to pregnancy - not an issue.  Follow.

## 2019-07-08 ENCOUNTER — Encounter: Payer: Self-pay | Admitting: Advanced Practice Midwife

## 2019-07-08 ENCOUNTER — Other Ambulatory Visit: Payer: Self-pay

## 2019-07-08 ENCOUNTER — Ambulatory Visit (INDEPENDENT_AMBULATORY_CARE_PROVIDER_SITE_OTHER): Payer: 59 | Admitting: Advanced Practice Midwife

## 2019-07-08 VITALS — BP 110/60 | Wt 176.0 lb

## 2019-07-08 DIAGNOSIS — Z3A31 31 weeks gestation of pregnancy: Secondary | ICD-10-CM

## 2019-07-08 DIAGNOSIS — Z3483 Encounter for supervision of other normal pregnancy, third trimester: Secondary | ICD-10-CM

## 2019-07-08 LAB — POCT URINALYSIS DIPSTICK OB
Glucose, UA: NEGATIVE
POC,PROTEIN,UA: NEGATIVE

## 2019-07-08 NOTE — Addendum Note (Signed)
Addended by: Quintella Baton D on: 07/08/2019 03:43 PM   Modules accepted: Orders

## 2019-07-08 NOTE — Progress Notes (Signed)
Routine Prenatal Care Visit  Subjective  Becky Mccoy is a 33 y.o. [phone] at [redacted]w[redacted]d being seen today for ongoing prenatal care.  She is currently monitored for the following issues for this low-risk pregnancy and has Thyroid fullness; OCD (obsessive compulsive disorder); Health care maintenance; Nipple discharge; Chest pain; Hypercholesterolemia; and Encounter for supervision of low-risk pregnancy, antepartum on their problem list.  ----------------------------------------------------------------------------------- Patient reports LRQ cramping since last night. The pain is constant and sometimes is worse than other times. She did take a bath last night which helped a little. She admits adequate hydration. She denies fever or any recent trauma to the area. She denies any history of ovarian cysts. We discussed possible diagnoses: round ligament pain, appendicitis, ovarian torsion/cyst.  Contractions: Not present. Vag. Bleeding: None.  Movement: Present. Leaking Fluid denies.  ----------------------------------------------------------------------------------- The following portions of the patient's history were reviewed and updated as appropriate: allergies, current medications, past family history, past medical history, past social history, past surgical history and problem list. Problem list updated.  Objective  Blood pressure 110/60, weight 176 lb (79.8 kg), last menstrual period 11/28/2018. Pregravid weight 164 lb (74.4 kg) Total Weight Gain 12 lb (5.443 kg) Urinalysis: Urine Protein    Urine Glucose    Fetal Status: Fetal Heart Rate (bpm): 145 Fundal Height: 32 cm Movement: Present     General:  Alert, oriented and cooperative. Patient is in no acute distress.  Skin: Skin is warm and dry. No rash noted.   Cardiovascular: Normal heart rate noted  Respiratory: Normal respiratory effort, no problems with respiration noted  Abdomen: Soft, gravid, appropriate for gestational age.  Pain/Pressure: Present, tender to palpation with guarding   Pelvic:  Cervical exam deferred        Extremities: Normal range of motion.  Edema: None  Mental Status: Normal mood and affect. Normal behavior. Normal judgment and thought content.   Assessment   33 y.o. [id number] at [redacted]w[redacted]d by  09/04/2019, by Last Menstrual Period presenting for routine prenatal visit  Plan   pregnancy 4  Problems (from 11/28/18 to present)    Problem Noted Resolved   Encounter for supervision of low-risk pregnancy, antepartum 01/09/2019 by Nadara Mustard, MD No   Overview Addendum 06/15/2019  9:08 AM by Farrel Conners, CNM    Clinic [facility] Prenatal Labs  Dating 8 wk [place] Blood type: O/Negative/-- (06/10 1437)   Genetic Screen declines Antibody:Positive, anti D after Rhogam  Anatomic [place] complete Rubella: 1.49 (06/10 1437) Varicella: Imm  GTT  RPR: Non Reactive (06/10 1437)   Rhogam 6/1; 28 w [x ]06/09/19 HBsAg: Negative (06/10 1437)   TDaP vaccine            Flu Shot:@work  HIV: Non Reactive (06/10 1437)   Baby Food      Breast                          GBS:   Contraception Vasectomy Pap:05/2018  CBB  No   CS/VBAC n/a   Support Person Reuel Boom (husband)               Preterm labor symptoms and general obstetric precautions including but not limited to vaginal bleeding, contractions, leaking of fluid and fetal movement were reviewed in detail with the patient. Please refer to After Visit Summary for other counseling recommendations.   Try epsom salt tub soak, hydration, rice sock/heating pad to the affected area Go to ER with worsening symptoms  Return  in about 2 weeks (around 07/22/2019) for rob.  Rod Can, CNM 07/08/2019 3:09 PM

## 2019-07-08 NOTE — Patient Instructions (Addendum)
Round Ligament Pain  The round ligament is a cord of muscle and tissue that helps support the uterus. It can become a source of pain during pregnancy if it becomes stretched or twisted as the baby grows. The pain usually begins in the second trimester (13-28 weeks) of pregnancy, and it can come and go until the baby is delivered. It is not a serious problem, and it does not cause harm to the baby. Round ligament pain is usually a short, sharp, and pinching pain, but it can also be a dull, lingering, and aching pain. The pain is felt in the lower side of the abdomen or in the groin. It usually starts deep in the groin and moves up to the outside of the hip area. The pain may occur when you:  Suddenly change position, such as quickly going from a sitting to standing position.  Roll over in bed.  Cough or sneeze.  Do physical activity. Follow these instructions at home:   Watch your condition for any changes.  When the pain starts, relax. Then try any of these methods to help with the pain: ? Sitting down. ? Flexing your knees up to your abdomen. ? Lying on your side with one pillow under your abdomen and another pillow between your legs. ? Sitting in a warm bath for 15-20 minutes or until the pain goes away.  Take over-the-counter and prescription medicines only as told by your health care provider.  Move slowly when you sit down or stand up.  Avoid long walks if they cause pain.  Stop or reduce your physical activities if they cause pain.  Keep all follow-up visits as told by your health care provider. This is important. Contact a health care provider if:  Your pain does not go away with treatment.  You feel pain in your back that you did not have before.  Your medicine is not helping. Get help right away if:  You have a fever or chills.  You develop uterine contractions.  You have vaginal bleeding.  You have nausea or vomiting.  You have diarrhea.  You have pain  when you urinate. Summary  Round ligament pain is felt in the lower abdomen or groin. It is usually a short, sharp, and pinching pain. It can also be a dull, lingering, and aching pain.  This pain usually begins in the second trimester (13-28 weeks). It occurs because the uterus is stretching with the growing baby, and it is not harmful to the baby.  You may notice the pain when you suddenly change position, when you cough or sneeze, or during physical activity.  Relaxing, flexing your knees to your abdomen, lying on one side, or taking a warm bath may help to get rid of the pain.  Get help from your health care provider if the pain does not go away or if you have vaginal bleeding, nausea, vomiting, diarrhea, or painful urination. This information is not intended to replace advice given to you by your health care provider. Make sure you discuss any questions you have with your health care provider. Document Released: 05/09/2008 Document Revised: 01/16/2018 Document Reviewed: 01/16/2018 Elsevier Patient Education  2020 Elsevier Inc[employer]  [employer] of the uterus can occur throughout pregnancy, but they are not always a sign that you are in labor. You may have practice contractions called Braxton Hicks contractions. These false labor contractions are sometimes confused with true labor. What are [place] contractions? Braxton Hicks contractions are tightening  movements that occur in the muscles of the uterus before labor. Unlike true labor contractions, these contractions do not result in opening (dilation) and thinning of the cervix. Toward the end of pregnancy (32-34 weeks), Braxton Hicks contractions can happen more often and may become stronger. These contractions are sometimes difficult to tell apart from true labor because they can be very uncomfortable. You should not feel embarrassed if you go to the hospital with false labor. Sometimes, the only way to tell if  you are in true labor is for your health care provider to look for changes in the cervix. The health care provider will do a physical exam and may monitor your contractions. If you are not in true labor, the exam should show that your cervix is not dilating and your water has not broken. If there are no other health problems associated with your pregnancy, it is completely safe for you to be sent home with false labor. You may continue to have Braxton Hicks contractions until you go into true labor. How to tell the difference between true labor and false labor True labor  Contractions last 30-70 seconds.  Contractions become very regular.  Discomfort is usually felt in the top of the uterus, and it spreads to the lower abdomen and low back.  Contractions do not go away with walking.  Contractions usually become more intense and increase in frequency.  The cervix dilates and gets thinner. False labor  Contractions are usually shorter and not as strong as true labor contractions.  Contractions are usually irregular.  Contractions are often felt in the front of the lower abdomen and in the groin.  Contractions may go away when you walk around or change positions while lying down.  Contractions get weaker and are shorter-lasting as time goes on.  The cervix usually does not dilate or become thin. Follow these instructions at home:   Take over-the-counter and prescription medicines only as told by your health care provider.  Keep up with your usual exercises and follow other instructions from your health care provider.  Eat and drink lightly if you think you are going into labor.  If Braxton Hicks contractions are making you uncomfortable: ? Change your position from lying down or resting to walking, or change from walking to resting. ? Sit and rest in a tub of warm water. ? Drink enough fluid to keep your urine pale yellow. Dehydration may cause these contractions. ? Do slow and  deep breathing several times an hour.  Keep all follow-up prenatal visits as told by your health care provider. This is important. Contact a health care provider if:  You have a fever.  You have continuous pain in your abdomen. Get help right away if:  Your contractions become stronger, more regular, and closer together.  You have fluid leaking or gushing from your vagina.  You pass blood-tinged mucus (bloody show).  You have bleeding from your vagina.  You have low back pain that you never had before.  You feel your baby's head pushing down and causing pelvic pressure.  Your baby is not moving inside you as much as it used to. Summary  Contractions that occur before labor are called Braxton Hicks contractions, false labor, or practice contractions.  Braxton Hicks contractions are usually shorter, weaker, farther apart, and less regular than true labor contractions. True labor contractions usually become progressively stronger and regular, and they become more frequent.  Manage discomfort from [facility] contractions by changing position, resting  in a warm bath, drinking plenty of water, or practicing deep breathing. This information is not intended to replace advice given to you by your health care provider. Make sure you discuss any questions you have with your health care provider. Document Released: 12/14/2016 Document Revised: 07/13/2017 Document Reviewed: 12/14/2016 Elsevier Patient Education  2020 [employer].

## 2019-07-09 ENCOUNTER — Telehealth: Payer: Self-pay

## 2019-07-09 NOTE — Telephone Encounter (Signed)
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  Does she need to be seen?  [phone]  Adv to ck with her HR dept.  She states she already has the paperwork.  Adv to bring it in; fill out our forms and pay; allow at least 10 business days for them to go thru the process here in the office.

## 2019-07-23 ENCOUNTER — Other Ambulatory Visit: Payer: Self-pay

## 2019-07-23 ENCOUNTER — Ambulatory Visit: Payer: 59 | Admitting: Certified Nurse Midwife

## 2019-07-23 VITALS — BP 110/70 | Temp 97.3°F | Wt 176.0 lb

## 2019-07-23 DIAGNOSIS — Z3493 Encounter for supervision of normal pregnancy, unspecified, third trimester: Secondary | ICD-10-CM

## 2019-07-23 DIAGNOSIS — Z23 Encounter for immunization: Secondary | ICD-10-CM

## 2019-07-23 DIAGNOSIS — Z3A33 33 weeks gestation of pregnancy: Secondary | ICD-10-CM

## 2019-07-23 DIAGNOSIS — Z3490 Encounter for supervision of normal pregnancy, unspecified, unspecified trimester: Secondary | ICD-10-CM

## 2019-07-23 LAB — POCT URINALYSIS DIPSTICK OB
Glucose, UA: NEGATIVE
POC,PROTEIN,UA: NEGATIVE

## 2019-07-23 NOTE — Progress Notes (Signed)
C/o being irritable.rj

## 2019-07-27 NOTE — Progress Notes (Signed)
ROB at 33.6 weeks: No vaginal bleeding or leakage of AF. No regular contractions. Baby active Desires to breast feed. Contraception: vasectomy FH 34 cm. FHT 132 Tdap today Given Ready, Set, Baby information.  ROB in 2 weeks. 34 week precautions  Dalia Heading, CNM

## 2019-08-06 ENCOUNTER — Other Ambulatory Visit: Payer: Self-pay

## 2019-08-06 ENCOUNTER — Encounter: Payer: Self-pay | Admitting: Obstetrics & Gynecology

## 2019-08-06 ENCOUNTER — Ambulatory Visit (INDEPENDENT_AMBULATORY_CARE_PROVIDER_SITE_OTHER): Payer: 59 | Admitting: Obstetrics & Gynecology

## 2019-08-06 VITALS — BP 120/80 | Wt 176.0 lb

## 2019-08-06 DIAGNOSIS — Z3A35 35 weeks gestation of pregnancy: Secondary | ICD-10-CM

## 2019-08-06 DIAGNOSIS — Z3685 Encounter for antenatal screening for Streptococcus B: Secondary | ICD-10-CM

## 2019-08-06 DIAGNOSIS — Z3490 Encounter for supervision of normal pregnancy, unspecified, unspecified trimester: Secondary | ICD-10-CM

## 2019-08-06 DIAGNOSIS — Z3483 Encounter for supervision of other normal pregnancy, third trimester: Secondary | ICD-10-CM

## 2019-08-06 LAB — POCT URINALYSIS DIPSTICK OB
Glucose, UA: NEGATIVE
POC,PROTEIN,UA: NEGATIVE

## 2019-08-06 NOTE — Addendum Note (Signed)
Addended by: Quintella Baton D on: 08/06/2019 04:37 PM   Modules accepted: Orders

## 2019-08-06 NOTE — Progress Notes (Signed)
  Subjective  Fetal Movement? yes Contractions? no Leaking Fluid? no Vaginal Bleeding? no  Objective  BP 120/80   Wt 176 lb (79.8 kg)   LMP 11/28/2018   BMI 28.41 kg/m  General: NAD Pumonary: no increased work of breathing Abdomen: gravid, non-tender Extremities: no edema Psychiatric: mood appropriate, affect full Cx 1-2/60/-3 Assessment  33 y.o. [id number] at [redacted]w[redacted]d by  09/04/2019, by Last Menstrual Period presenting for routine prenatal visit  Plan   Problem List Items Addressed This Visit      Other   Encounter for supervision of low-risk pregnancy, antepartum    Other Visit Diagnoses    [redacted] weeks gestation of pregnancy    -  Primary   Encounter for antenatal screening for Streptococcus B       Relevant Orders   Culture, beta strep (group b only)      pregnancy 4  Problems (from 11/28/18 to present)    Problem Noted Resolved   Encounter for supervision of low-risk pregnancy, antepartum 01/09/2019 by Gae Dry, MD No   Overview Addendum 06/15/2019  9:08 AM by Dalia Heading, [facility] Prenatal Labs  Dating 8 wk [place] Blood type: O/Negative/-- (06/10 1437)   Genetic Screen declines Antibody:Positive, anti D after Rhogam  Anatomic [place] complete Rubella: 1.49 (06/10 1437) Varicella: Imm  GTT  RPR: Non Reactive (06/10 1437)   Rhogam 6/1; 28 w [x ]06/09/19 HBsAg: Negative (06/10 1437)   TDaP vaccine            Flu Shot:@work  HIV: Non Reactive (06/10 1437)   Baby Food      Breast                          GBS:   Contraception Vasectomy Pap:05/2018  CBB  No   CS/VBAC n/a   Support Person Quillian Quince (husband)             PNV, [place], Labor precautions  GBS done  Barnett Applebaum, MD, [facility], [facility] 08/06/2019  4:32 PM

## 2019-08-10 LAB — CULTURE, BETA STREP (GROUP B ONLY): Strep Gp B Culture: NEGATIVE

## 2019-08-14 ENCOUNTER — Ambulatory Visit (INDEPENDENT_AMBULATORY_CARE_PROVIDER_SITE_OTHER): Payer: 59 | Admitting: Obstetrics & Gynecology

## 2019-08-14 ENCOUNTER — Other Ambulatory Visit: Payer: Self-pay

## 2019-08-14 ENCOUNTER — Encounter: Payer: Self-pay | Admitting: Obstetrics & Gynecology

## 2019-08-14 VITALS — BP 120/80 | Wt 178.0 lb

## 2019-08-14 DIAGNOSIS — Z3490 Encounter for supervision of normal pregnancy, unspecified, unspecified trimester: Secondary | ICD-10-CM

## 2019-08-14 DIAGNOSIS — Z3A37 37 weeks gestation of pregnancy: Secondary | ICD-10-CM

## 2019-08-14 DIAGNOSIS — Z3483 Encounter for supervision of other normal pregnancy, third trimester: Secondary | ICD-10-CM

## 2019-08-14 NOTE — Progress Notes (Signed)
  Subjective  Fetal Movement? yes Contractions? no Leaking Fluid? no Vaginal Bleeding? no  Objective  BP 120/80   Wt 178 lb (80.7 kg)   LMP 11/28/2018   BMI 28.73 kg/m  General: NAD Pumonary: no increased work of breathing Abdomen: gravid, non-tender Extremities: no edema Psychiatric: mood appropriate, affect full  Assessment  33 y.o. [id number] at [redacted]w[redacted]d by  09/04/2019, by Last Menstrual Period presenting for routine prenatal visit  Plan   Problem List Items Addressed This Visit      Other   Encounter for supervision of low-risk pregnancy, antepartum    Other Visit Diagnoses    [redacted] weeks gestation of pregnancy    -  Primary      pregnancy 4  Problems (from 11/28/18 to present)    Problem Noted Resolved   Encounter for supervision of low-risk pregnancy, antepartum 01/09/2019 by Gae Dry, MD No   Overview Addendum 06/15/2019  9:08 AM by Dalia Heading, [place] Prenatal Labs  Dating 8 wk [place] Blood type: O/Negative/-- (06/10 1437)   Genetic Screen declines Antibody:Positive, anti D after Rhogam  Anatomic [place] complete Rubella: 1.49 (06/10 1437) Varicella: Imm  GTT  RPR: Non Reactive (06/10 1437)   Rhogam 6/1; 28 w [x ]06/09/19 HBsAg: Negative (06/10 1437)   TDaP vaccine            Flu Shot:@work  HIV: Non Reactive (06/10 1437)   Baby Food      Breast                          GBS:   Contraception Vasectomy Pap:05/2018  CBB  No   CS/VBAC n/a   Support Person Becky Mccoy (husband)               Barnett Applebaum, MD, [place], [facility] 08/14/2019  4:56 PM

## 2019-08-15 NOTE — L&D Delivery Note (Signed)
Delivery Note At 6:00 PM a viable female child was delivered via Vaginal, Spontaneous (Presentation:ROA).  APGAR:6/7; weight pending.   Placenta status: Spontaneous, Intact.  Cord: 3 vessels with following complications: meconium.  Cord pH: N/A  Anesthesia: Epidural Episiotomy:  none Lacerations:  1st degree Suture Repair: 3.0 vicryl Est. Blood Loss (mL):  455  Mom to postpartum.  Baby to Nursery.  Vena Austria 08/28/2019, 6:14 PM

## 2019-08-21 ENCOUNTER — Ambulatory Visit (INDEPENDENT_AMBULATORY_CARE_PROVIDER_SITE_OTHER): Payer: 59 | Admitting: Certified Nurse Midwife

## 2019-08-21 ENCOUNTER — Other Ambulatory Visit: Payer: Self-pay

## 2019-08-21 ENCOUNTER — Telehealth: Payer: Self-pay

## 2019-08-21 VITALS — BP 120/72 | Wt 181.0 lb

## 2019-08-21 DIAGNOSIS — Z3403 Encounter for supervision of normal first pregnancy, third trimester: Secondary | ICD-10-CM

## 2019-08-21 DIAGNOSIS — Z3490 Encounter for supervision of normal pregnancy, unspecified, unspecified trimester: Secondary | ICD-10-CM

## 2019-08-21 DIAGNOSIS — Z3A38 38 weeks gestation of pregnancy: Secondary | ICD-10-CM

## 2019-08-21 LAB — POCT URINALYSIS DIPSTICK OB: Glucose, UA: NEGATIVE

## 2019-08-21 NOTE — Progress Notes (Signed)
ROB at 38 weeks: Doing well. Irregular contractions. No vaginal bleeding. Some mucoid discharge. Baby active FH 37cm,  FHT 134 Cervix: 3/40%/-2/ soft/ mid  Labor precautions ROB 1 week  Farrel Conners, CNM

## 2019-08-21 NOTE — Progress Notes (Signed)
pocNo complaints

## 2019-08-21 NOTE — Telephone Encounter (Signed)
Lactation Consultant and lactation student talked with Becky Mccoy in- person at her prenatal appointment about the benefits of breastfeeding as well as discussed breastfeeding information per the Ready, Set, Baby curriculum. She was encouraged to review breastfeeding information on the Ready, Set, Baby website and given information on virtual breastfeeding classes.

## 2019-08-22 ENCOUNTER — Encounter: Payer: 59 | Admitting: Certified Nurse Midwife

## 2019-08-27 ENCOUNTER — Telehealth: Payer: Self-pay

## 2019-08-27 NOTE — Telephone Encounter (Signed)
Pt calling; tx'd from SP; 38wks; had ctxs 3-85min apart for 3hrs; now they are off an on; what to do?  Adv to hang out at home until they get more consistent.  To go to L&C when either pain is so intense she cannot talk thru it, water breaks, vaginal bleeding.  Pt states she bled for 3d after last appt and is still just a tiny bit.  She was examined last visit.  States she also has a lot of pressure with walking.  Pt aware to enter hosp via ED.

## 2019-08-28 ENCOUNTER — Inpatient Hospital Stay: Payer: 59 | Admitting: Registered Nurse

## 2019-08-28 ENCOUNTER — Encounter: Payer: 59 | Admitting: Obstetrics and Gynecology

## 2019-08-28 ENCOUNTER — Encounter: Payer: Self-pay | Admitting: Obstetrics and Gynecology

## 2019-08-28 ENCOUNTER — Inpatient Hospital Stay
Admission: EM | Admit: 2019-08-28 | Discharge: 2019-08-29 | DRG: 807 | Disposition: A | Discharge status: Home / Self Care | Payer: 59 | Attending: Obstetrics and Gynecology | Admitting: Obstetrics and Gynecology

## 2019-08-28 ENCOUNTER — Other Ambulatory Visit: Payer: Self-pay

## 2019-08-28 DIAGNOSIS — O26893 Other specified pregnancy related conditions, third trimester: Secondary | ICD-10-CM | POA: Diagnosis present

## 2019-08-28 DIAGNOSIS — O4202 Full-term premature rupture of membranes, onset of labor within 24 hours of rupture: Secondary | ICD-10-CM | POA: Diagnosis not present

## 2019-08-28 DIAGNOSIS — Z3490 Encounter for supervision of normal pregnancy, unspecified, unspecified trimester: Secondary | ICD-10-CM

## 2019-08-28 DIAGNOSIS — Z3A39 39 weeks gestation of pregnancy: Secondary | ICD-10-CM | POA: Diagnosis not present

## 2019-08-28 DIAGNOSIS — K219 Gastro-esophageal reflux disease without esophagitis: Secondary | ICD-10-CM

## 2019-08-28 DIAGNOSIS — Z20822 Contact with and (suspected) exposure to covid-19: Secondary | ICD-10-CM | POA: Diagnosis not present

## 2019-08-28 DIAGNOSIS — O4290 Premature rupture of membranes, unspecified as to length of time between rupture and onset of labor, unspecified weeks of gestation: Secondary | ICD-10-CM | POA: Diagnosis present

## 2019-08-28 DIAGNOSIS — Z6791 Unspecified blood type, Rh negative: Secondary | ICD-10-CM

## 2019-08-28 LAB — ABO/RH: ABO/RH(D): O NEG

## 2019-08-28 LAB — CBC
HCT: 32.8 % — ABNORMAL LOW (ref 36.0–46.0)
Hemoglobin: 10.7 g/dL — ABNORMAL LOW (ref 12.0–15.0)
MCH: 27.6 pg (ref 26.0–34.0)
MCHC: 32.6 g/dL (ref 30.0–36.0)
MCV: 84.5 fL (ref 80.0–100.0)
Platelets: 262 10*3/uL (ref 150–400)
RBC: 3.88 MIL/uL (ref 3.87–5.11)
RDW: 14.1 % (ref 11.5–15.5)
WBC: 10.5 10*3/uL (ref 4.0–10.5)
nRBC: 0 % (ref 0.0–0.2)

## 2019-08-28 LAB — RESPIRATORY PANEL BY RT PCR (FLU A&B, COVID)
Influenza A by PCR: NEGATIVE
Influenza B by PCR: NEGATIVE
SARS Coronavirus 2 by RT PCR: NEGATIVE

## 2019-08-28 MED ORDER — BUPIVACAINE HCL (PF) 0.25 % IJ SOLN
INTRAMUSCULAR | Status: DC | PRN
  Administered 2019-08-28 (×2): 4 mL via EPIDURAL

## 2019-08-28 MED ORDER — ONDANSETRON HCL 4 MG PO TABS: 4.0000 mg | ORAL_TABLET | ORAL | Status: DC | PRN

## 2019-08-28 MED ORDER — PRENATAL MULTIVITAMIN CH
1.0000 | ORAL_TABLET | Freq: Every day | ORAL | Status: DC
  Administered 2019-08-29: 17:00:00 1 via ORAL
  Filled 2019-08-28: qty 1

## 2019-08-28 MED ORDER — OXYTOCIN 40 UNITS IN NORMAL SALINE INFUSION - SIMPLE MED
2.5000 [IU]/h | INTRAVENOUS | Status: DC
  Administered 2019-08-28: 19:00:00 2.5 [IU]/h via INTRAVENOUS

## 2019-08-28 MED ORDER — ONDANSETRON HCL 4 MG/2ML IJ SOLN: 4.0000 mg | Freq: Four times a day (QID) | INTRAMUSCULAR | Status: DC | PRN

## 2019-08-28 MED ORDER — OXYCODONE-ACETAMINOPHEN 5-325 MG PO TABS: 1.0000 | ORAL_TABLET | ORAL | Status: DC | PRN

## 2019-08-28 MED ORDER — FENTANYL 2.5 MCG/ML W/ROPIVACAINE 0.15% IN NS 100 ML EPIDURAL (ARMC)
EPIDURAL | Status: AC
  Filled 2019-08-28: qty 100

## 2019-08-28 MED ORDER — FENTANYL 2.5 MCG/ML W/ROPIVACAINE 0.15% IN NS 100 ML EPIDURAL (ARMC)
EPIDURAL | Status: DC | PRN
  Administered 2019-08-28: 12 mL/h via EPIDURAL

## 2019-08-28 MED ORDER — LIDOCAINE HCL (PF) 1 % IJ SOLN
INTRAMUSCULAR | Status: AC
  Filled 2019-08-28: qty 30

## 2019-08-28 MED ORDER — BENZOCAINE-MENTHOL 20-0.5 % EX AERO
1.0000 "application " | INHALATION_SPRAY | CUTANEOUS | Status: DC | PRN
  Filled 2019-08-28: qty 56

## 2019-08-28 MED ORDER — WITCH HAZEL-GLYCERIN EX PADS
1.0000 "application " | MEDICATED_PAD | CUTANEOUS | Status: DC | PRN
  Filled 2019-08-28: qty 100

## 2019-08-28 MED ORDER — OXYTOCIN 40 UNITS IN NORMAL SALINE INFUSION - SIMPLE MED
1.0000 m[IU]/min | INTRAVENOUS | Status: DC
  Administered 2019-08-28: 13:00:00 2 m[IU]/min via INTRAVENOUS
  Filled 2019-08-28: qty 1000

## 2019-08-28 MED ORDER — DIBUCAINE (PERIANAL) 1 % EX OINT: 1.0000 "application " | TOPICAL_OINTMENT | CUTANEOUS | Status: DC | PRN

## 2019-08-28 MED ORDER — OXYCODONE-ACETAMINOPHEN 5-325 MG PO TABS: 2.0000 | ORAL_TABLET | ORAL | Status: DC | PRN

## 2019-08-28 MED ORDER — LACTATED RINGERS IV SOLN: INTRAVENOUS | Status: DC

## 2019-08-28 MED ORDER — OXYTOCIN BOLUS FROM INFUSION
500.0000 mL | Freq: Once | INTRAVENOUS | Status: AC
  Administered 2019-08-28: 18:00:00 500 mL via INTRAVENOUS

## 2019-08-28 MED ORDER — MISOPROSTOL 200 MCG PO TABS
ORAL_TABLET | ORAL | Status: AC
  Filled 2019-08-28: qty 4

## 2019-08-28 MED ORDER — ACETAMINOPHEN 325 MG PO TABS: 650.0000 mg | ORAL_TABLET | ORAL | Status: DC | PRN

## 2019-08-28 MED ORDER — SIMETHICONE 80 MG PO CHEW: 80.0000 mg | CHEWABLE_TABLET | ORAL | Status: DC | PRN

## 2019-08-28 MED ORDER — AMMONIA AROMATIC IN INHA
RESPIRATORY_TRACT | Status: AC
  Filled 2019-08-28: qty 10

## 2019-08-28 MED ORDER — ONDANSETRON HCL 4 MG/2ML IJ SOLN: 4.0000 mg | INTRAMUSCULAR | Status: DC | PRN

## 2019-08-28 MED ORDER — OXYTOCIN 10 UNIT/ML IJ SOLN
INTRAMUSCULAR | Status: AC
  Filled 2019-08-28: qty 2

## 2019-08-28 MED ORDER — SOD CITRATE-CITRIC ACID 500-334 MG/5ML PO SOLN: 30.0000 mL | ORAL | Status: DC | PRN

## 2019-08-28 MED ORDER — TERBUTALINE SULFATE 1 MG/ML IJ SOLN: 0.2500 mg | Freq: Once | INTRAMUSCULAR | Status: DC | PRN

## 2019-08-28 MED ORDER — LIDOCAINE HCL (PF) 2 % IJ SOLN
INTRAMUSCULAR | Status: AC
  Filled 2019-08-28: qty 10

## 2019-08-28 MED ORDER — LIDOCAINE-EPINEPHRINE (PF) 1.5 %-1:200000 IJ SOLN
INTRAMUSCULAR | Status: DC | PRN
  Administered 2019-08-28: 3 mL via EPIDURAL

## 2019-08-28 MED ORDER — COCONUT OIL OIL
1.0000 "application " | TOPICAL_OIL | Status: DC | PRN
  Filled 2019-08-28: qty 120

## 2019-08-28 MED ORDER — LACTATED RINGERS IV SOLN
500.0000 mL | INTRAVENOUS | Status: DC | PRN
  Administered 2019-08-28: 16:00:00 500 mL via INTRAVENOUS

## 2019-08-28 MED ORDER — LIDOCAINE HCL (PF) 1 % IJ SOLN
INTRAMUSCULAR | Status: DC | PRN
  Administered 2019-08-28: 3 mL via SUBCUTANEOUS

## 2019-08-28 MED ORDER — OXYTOCIN 40 UNITS IN NORMAL SALINE INFUSION - SIMPLE MED
INTRAVENOUS | Status: AC
  Filled 2019-08-28: qty 1000

## 2019-08-28 MED ORDER — DIPHENHYDRAMINE HCL 25 MG PO CAPS: 25.0000 mg | ORAL_CAPSULE | Freq: Four times a day (QID) | ORAL | Status: DC | PRN

## 2019-08-28 MED ORDER — IBUPROFEN 600 MG PO TABS
600.0000 mg | ORAL_TABLET | Freq: Four times a day (QID) | ORAL | Status: DC
  Administered 2019-08-29 (×3): 600 mg via ORAL
  Filled 2019-08-28 (×3): qty 1

## 2019-08-28 MED ORDER — SENNOSIDES-DOCUSATE SODIUM 8.6-50 MG PO TABS
2.0000 | ORAL_TABLET | ORAL | Status: DC
  Administered 2019-08-29: 2 via ORAL
  Filled 2019-08-28: qty 2

## 2019-08-28 MED ORDER — LIDOCAINE HCL (PF) 1 % IJ SOLN: 30.0000 mL | INTRAMUSCULAR | Status: DC | PRN

## 2019-08-28 MED ORDER — FLUOXETINE HCL 10 MG PO CAPS: 80.0000 mg | ORAL_CAPSULE | Freq: Every day | ORAL | Status: DC

## 2019-08-28 NOTE — Progress Notes (Signed)
Pt presents to ED reporting leaking of fluid. Pt is [redacted]w[redacted]d [id number]. _Pt noticed LOF at 0630. Pt is 4.5/60/-2 and is grossly ruptured with light mec. Pt denies vaginal bleeding, and reports decreased fetal movement. External monitors applied and assessing. Initial FHR 135. VSS.

## 2019-08-28 NOTE — Progress Notes (Signed)
Epidural infusion stopped at 1825.

## 2019-08-28 NOTE — Progress Notes (Deleted)
Infant with decreased respiratory rate, dusky, decreased breath sounds with grunting noted at approximately 5 minutes of life while skin to skin with  MOB.  Infant taken to heat shield.  Infant deep suctioned moderate meconium from oral and nares.  Infant appeared stunned, arching and not making adequate respiratory attempts.  NNP called to bedside for further assistance. Infant required CPAP for approximately 30 sec to 1 minute with increasing color and perfusion.  HR remained stable as well as tone.  Infant placed on Sat monitor with sats greater than 90. Infant placed skin to skin with MOB.  Infant with grunting noted, but adequate respiratory effort.  Portable sat placed on right wrist for continued monitoring.  This RN called back to room with sats dropping to mid 70's, infant dusky with decreased breath sounds bilaterally.  Infant stimulated and suctioned with minimal effort.  Infant placed back on heat shield. NNP called and notified.  Infant taken to SCN for observation.  Infant pinked up, rooting on hands, no longer arching.  Sats WNL without supplemental oxygen, blood sugar 78. Good breath sounds bilaterally.  NNP called and notified, infant ok to return to MOB.  Infant returned to MOB.  MOB placed infant to breast. Infant latched without assistance. Infant remained pink throughout feeding.  MOB instructed to call with any concerns with infant.  

## 2019-08-28 NOTE — Progress Notes (Signed)
   Subjective:  Feeling increasingly painful contractions  Objective:   Vitals: Blood pressure 125/85, pulse 94, temperature 98.5 F (36.9 C), temperature source Oral, resp. rate 16, height 5\' 6"  (1.676 m), weight 82.1 kg, last menstrual period 11/28/2018. General: NAD Abdomen: gravid, non-tender Cervical Exam:  Dilation: 6 Effacement (%): 90 Cervical Position: Middle Station: -1 Presentation: Vertex Exam by:: Cleo Villamizar MD  FHT: 125, moderate, +accels, no decels Toco:q4-44min  Results for orders placed or performed during the hospital encounter of 08/28/19 (from the past 24 hour(s))  Respiratory Panel by RT PCR (Flu A&B, Covid) - Nasopharyngeal Swab     Status: None   Collection Time: 08/28/19  9:29 AM   Specimen: Nasopharyngeal Swab  Result Value Ref Range   SARS Coronavirus 2 by RT PCR NEGATIVE NEGATIVE   Influenza A by PCR NEGATIVE NEGATIVE   Influenza B by PCR NEGATIVE NEGATIVE  CBC     Status: Abnormal   Collection Time: 08/28/19 10:36 AM  Result Value Ref Range   WBC 10.5 4.0 - 10.5 K/uL   RBC 3.88 3.87 - 5.11 MIL/uL   Hemoglobin 10.7 (L) 12.0 - 15.0 g/dL   HCT 08/30/19 (L) 03.0 - 09.2 %   MCV 84.5 80.0 - 100.0 fL   MCH 27.6 26.0 - 34.0 pg   MCHC 32.6 30.0 - 36.0 g/dL   RDW 33.0 07.6 - 22.6 %   Platelets 262 150 - 400 K/uL   nRBC 0.0 0.0 - 0.2 %  Type and screen [facility]     Status: None (Preliminary result)   Collection Time: 08/28/19 10:36 AM  Result Value Ref Range   ABO/RH(D) O NEG    Antibody Screen POS    Sample Expiration 08/31/2019,2359    Antibody Identification      PASSIVELY ACQUIRED ANTI-D NON SPECIFIC ANTIBODY REACTIVITY   Unit Number 09/02/2019    Blood Component Type RED CELLS,LR    Unit division 00    Status of Unit ALLOCATED    Transfusion Status OK TO TRANSFUSE    Crossmatch Result COMPATIBLE    Unit Number [id number]    Blood Component Type RED CELLS,LR    Unit division 00    Status of Unit ALLOCATED    Transfusion Status OK TO TRANSFUSE    Crossmatch Result COMPATIBLE   ABO/Rh     Status: None   Collection Time: 08/28/19 12:48 PM  Result Value Ref Range   ABO/RH(D)      08/30/19 NEG Performed at [facility], [address], [place], [place]     Assessment:   34 y.o. 34 [redacted]w[redacted]d augmentation of prelabor rupture of membranes - meconium  Plan:   1) Labor - continue pitocin  2) Fetus - cat I tracing  [redacted]w[redacted]d, MD, V[facility] Austria [facility], [facility] 08/28/2019, 3:58 PM

## 2019-08-28 NOTE — Anesthesia Procedure Notes (Signed)
Epidural Patient location during procedure: OB Start time: 08/28/2019 4:16 PM End time: 08/28/2019 4:25 PM  Staffing Anesthesiologist: Yevette Edwards, MD Resident/CRNA: Karoline Caldwell, CRNA Performed: resident/CRNA   Preanesthetic Checklist Completed: patient identified, IV checked, site marked, risks and benefits discussed, surgical consent, monitors and equipment checked, pre-op evaluation and timeout performed  Epidural Patient position: sitting Prep: ChloraPrep Patient monitoring: heart rate, continuous pulse ox and blood pressure Approach: midline Location: L3-L4 Injection technique: LOR air  Needle:  Needle type: Tuohy  Needle gauge: 17 G Needle length: 9 cm and 9 Needle insertion depth: 6 cm Catheter type: closed end flexible Catheter size: 19 Gauge Catheter at skin depth: 12 cm Test dose: negative and 1.5% lidocaine with Epi 1:200 K  Assessment Events: blood not aspirated, injection not painful, no injection resistance, no paresthesia and negative IV test  Additional Notes 1 attempt Pt. Evaluated and documentation done after procedure finished. Patient identified. Risks/Benefits/Options discussed with patient including but not limited to bleeding, infection, nerve damage, paralysis, failed block, incomplete pain control, headache, blood pressure changes, nausea, vomiting, reactions to medication both or allergic, itching and postpartum back pain. Confirmed with bedside nurse the patient's most recent platelet count. Confirmed with patient that they are not currently taking any anticoagulation, have any bleeding history or any family history of bleeding disorders. Patient expressed understanding and wished to proceed. All questions were answered. Sterile technique was used throughout the entire procedure. Please see nursing notes for vital signs. Test dose was given through epidural catheter and negative prior to continuing to dose epidural or start infusion. Warning signs of  high block given to the patient including shortness of breath, tingling/numbness in hands, complete motor block, or any concerning symptoms with instructions to call for help. Patient was given instructions on fall risk and not to get out of bed. All questions and concerns addressed with instructions to call with any issues or inadequate analgesia.   Patient tolerated the insertion well without immediate complications.Reason for block:procedure for pain

## 2019-08-28 NOTE — Discharge Summary (Signed)
Obstetric Discharge Summary Reason for Admission: rupture of membranes Prenatal Procedures: none Intrapartum Procedures: spontaneous vaginal delivery Postpartum Procedures: none Complications-Operative and Postpartum: none Hemoglobin  Date Value Ref Range Status  08/29/2019 9.8 (L) 12.0 - 15.0 g/dL Final  46/50/3546 56.8 11.1 - 15.9 g/dL Final   HCT  Date Value Ref Range Status  08/29/2019 30.6 (L) 36.0 - 46.0 % Final   Hematocrit  Date Value Ref Range Status  06/09/2019 34.1 34.0 - 46.6 % Final    Physical Exam:  General: alert, cooperative and no distress Lochia: appropriate Uterine Fundus: firm Incision: healing well DVT Evaluation: No evidence of DVT seen on physical exam.  Discharge Diagnoses: Term Pregnancy-delivered  Discharge Information: Date: 08/29/2019 Activity: pelvic rest Diet: routine Allergies as of 08/29/2019      Reactions   Sulfa Antibiotics       Medication List    TAKE these medications   acetaminophen 325 MG tablet Commonly known as: Tylenol Take 2 tablets (650 mg total) by mouth every 4 (four) hours as needed (for pain scale < 4).   FLUoxetine 20 MG capsule Commonly known as: PROZAC TAKE 4 CAPSULES BY MOUTH ONCE DAILY PT  TAKE  80  MG  ONCE  A  DAY   ibuprofen 600 MG tablet Commonly known as: ADVIL Take 1 tablet (600 mg total) by mouth every 6 (six) hours as needed.   omeprazole 40 MG capsule Commonly known as: PRILOSEC Take 1 capsule (40 mg total) by mouth daily as needed (heartburn or reflux). What changed:   when to take this  reasons to take this   prenatal vitamin w/FE, FA 27-1 MG Tabs tablet Take 1 tablet by mouth daily at 12 noon.       Condition: stable Discharge to: home Follow-up Information    Vena Austria, MD Follow up in 6 week(s).   Specialty: Obstetrics and Gynecology Why: postpartum visit Contact information: [address] [place] [address] [phone]           Newborn Data: Live  born female infant Dereck Ligas Birth Weight:  7#12.2 oz APGAR:6 , 8  Newborn Delivery   Birth date/time: 08/28/2019 18:00:00 Delivery type: Vaginal, Spontaneous     Mother O neg, baby A neg-Rhogam not indicated Home with mother.  Farrel Conners 08/29/2019, 9:50 AM

## 2019-08-28 NOTE — Anesthesia Preprocedure Evaluation (Signed)
Anesthesia Evaluation  Patient identified by MRN, date of birth, ID band Patient awake    Reviewed: Allergy & Precautions, H&P , NPO status , Patient's Chart, lab work & pertinent test results  Airway Mallampati: II  TM Distance: >3 FB Neck ROM: full    Dental  (+) Teeth Intact   Pulmonary    Pulmonary exam normal        Cardiovascular Normal cardiovascular exam     Neuro/Psych PSYCHIATRIC DISORDERS Anxiety    GI/Hepatic GERD  Medicated and Controlled,  Endo/Other    Renal/GU      Musculoskeletal   Abdominal   Peds  Hematology   Anesthesia Other Findings   Reproductive/Obstetrics (+) Pregnancy                             Anesthesia Physical Anesthesia Plan  ASA: II  Anesthesia Plan: Epidural   Post-op Pain Management:    Induction:   PONV Risk Score and Plan:   Airway Management Planned:   Additional Equipment:   Intra-op Plan:   Post-operative Plan:   Informed Consent: I have reviewed the patients History and Physical, chart, labs and discussed the procedure including the risks, benefits and alternatives for the proposed anesthesia with the patient or authorized representative who has indicated his/her understanding and acceptance.     Dental Advisory Given  Plan Discussed with: Anesthesiologist and CRNA  Anesthesia Plan Comments:         Anesthesia Quick Evaluation

## 2019-08-28 NOTE — H&P (Signed)
Obstetric H&P   Chief Complaint: Leaking fluid  Prenatal Care Provider: [facility]  History of Present Illness: 34 y.o. [id number] [redacted]w[redacted]d by 09/04/2019, by Last Menstrual Period presenting to L&D with leaking of fluid since 0600.  Noted to be grossly ruptured light meconium on presentation.  Prenatal care uncomplicated to date.  +FM, +contractions, no VB  Pregravid weight 74.4 kg Total Weight Gain 7.711 kg  pregnancy 4  Problems (from 11/28/18 to present)    Problem Noted Resolved   Encounter for supervision of low-risk pregnancy, antepartum 01/09/2019 by Gae Dry, MD No   Overview Addendum 08/21/2019  3:19 PM by Dalia Heading, [place] Prenatal Labs  Dating 8 wk [place] Blood type: O/Negative/-- (06/10 1437)   Genetic Screen declines Antibody:Positive, anti D after Rhogam  Anatomic [place] complete Rubella: 1.49 (06/10 1437) Varicella: Imm  GTT 129 RPR: Non Reactive (06/10 1437)   Rhogam 6/1; 28 w [x ]06/09/19 HBsAg: Negative (06/10 1437)   TDaP vaccine 07/23/19  Flu Shot:@work  HIV: Non Reactive (06/10 1437)   Baby Food  Breast                          GBS: negative  Contraception Vasectomy Pap:05/2018  CBB  No   CS/VBAC n/a   Support Person Quillian Quince (husband)               Review of Systems: 10 point review of systems negative unless otherwise noted in HPI  Past Medical History: Past Medical History:  Diagnosis Date  . History of chicken pox   . Hx: UTI (urinary tract infection)   . Obsessive-compulsive and related disorder   . OCD (obsessive compulsive disorder)     Past Surgical History: Past Surgical History:  Procedure Laterality Date  . WISDOM TOOTH EXTRACTION      Past Obstetric History: # 1 - Date: None, Sex: None, Weight: None, GA: None, Delivery: None, Apgar1: None, Apgar5: None, Living: None, Birth Comments: None  # 2 - Date: 03/31/06, Sex: Female, Weight: 2722 g, GA: [redacted]w[redacted]d, Delivery: Vaginal, Spontaneous, Apgar1: None, Apgar5: None, Living: None,  Birth Comments: None  # 3 - Date: 12/27/12, Sex: Female, Weight: 3175 g, GA: [redacted]w[redacted]d, Delivery: Vaginal, Spontaneous, Apgar1: None, Apgar5: None, Living: None, Birth Comments: None  # 4 - Date: None, Sex: None, Weight: None, GA: None, Delivery: None, Apgar1: None, Apgar5: None, Living: None, Birth Comments: None   Family History: Family History  Problem Relation Age of Onset  . Healthy Mother   . Healthy Father   . Cancer - Other Maternal Grandfather        stomach cancer  . Cancer - Other Paternal Grandfather        Bone cancer    Social History: Social History   Socioeconomic History  . Marital status: Married    Spouse name: Not on file  . Number of children: Not on file  . Years of education: Not on file  . Highest education level: Not on file  Occupational History  . Not on file  Tobacco Use  . Smoking status: Never Smoker  . Smokeless tobacco: Never Used  Substance and Sexual Activity  . Alcohol use: No    Alcohol/week: 0.0 standard drinks  . Drug use: No  . Sexual activity: Yes    Birth control/protection: None    Comment: Vasectomy  Other Topics Concern  . Not on file  Social History Narrative  .  Not on file   Social Determinants of Health   Financial Resource Strain:   . Difficulty of Paying Living Expenses: Not on file  Food Insecurity:   . Worried About Programme researcher, broadcasting/film/video in the Last Year: Not on file  . Ran Out of Food in the Last Year: Not on file  Transportation Needs:   . Lack of Transportation (Medical): Not on file  . Lack of Transportation (Non-Medical): Not on file  Physical Activity:   . Days of Exercise per Week: Not on file  . Minutes of Exercise per Session: Not on file  Stress:   . Feeling of Stress : Not on file  Social Connections:   . Frequency of Communication with Friends and Family: Not on file  . Frequency of Social Gatherings with Friends and Family: Not on file  . Attends Religious Services: Not on file  . Active Member of  Clubs or Organizations: Not on file  . Attends Banker Meetings: Not on file  . Marital Status: Not on file  Intimate Partner Violence:   . Fear of Current or Ex-Partner: Not on file  . Emotionally Abused: Not on file  . Physically Abused: Not on file  . Sexually Abused: Not on file    Medications: Prior to Admission medications   Medication Sig Start Date End Date Taking? Authorizing Provider  FLUoxetine (PROZAC) 20 MG capsule TAKE 4 CAPSULES BY MOUTH ONCE DAILY PT  TAKE  80  MG  ONCE  A  DAY 04/28/19  Yes Cottle, Steva Ready., MD  omeprazole (PRILOSEC) 40 MG capsule Take 1 capsule (40 mg total) by mouth daily. 05/12/19  Yes Schuman, Christanna R, MD  prenatal vitamin w/FE, FA (PRENATAL 1 + 1) 27-1 MG TABS tablet Take 1 tablet by mouth daily at 12 noon.   Yes [provider]    Allergies: Allergies  Allergen Reactions  . Sulfa Antibiotics     Physical Exam: Vitals: Blood pressure 122/72, pulse (!) 103, temperature 99.7 F (37.6 C), temperature source Oral, resp. rate 18, height 5\' 6"  (1.676 m), weight 82.1 kg, last menstrual period 11/28/2018.  FHT: 125, moderate, +Accels, no decels Toco: some irritability  General: NAD HEENT: normocephalic, anicteric Pulmonary: No increased work of breathing Cardiovascular: RRR, distal pulses 2+ Abdomen: Gravid, non-tender Leopolds: vtx Genitourinary: Dilation: 4.5 Effacement (%): 60 Cervical Position: Middle Station: -2 Presentation: Vertex Exam by:: K.Maldonado,RN  Extremities: no edema, erythema, or tenderness Neurologic: Grossly intact Psychiatric: mood appropriate, affect full  Labs: No results found for this or any previous visit (from the past 24 hour(s)).  Assessment: 34 y.o. 33 [redacted]w[redacted]d by 09/04/2019, by Last Menstrual Period presenting with prelabor rupture of membranes  Plan: 1) Prelabor rupture of membranes - monitor contractions, augment with pitocin as needed  2) Fetus - cat I  3) PNL -  Blood type O/Negative/-- (06/10 1437) / Anti-bodyscreen Negative (10/26 1540) / Rubella 1.49 (06/10 1437) / Varicella Immune / RPR Non Reactive (10/26 1540) / HBsAg Negative (06/10 1437) / HIV Non Reactive (10/26 1540) / 1-hr OGTT 129/ GBS Negative/-- (12/23 1643)  4) Immunization History -  Immunization History  Administered Date(s) Administered  . Influenza Split 05/18/2014  . Influenza-Unspecified 05/15/2015, 06/25/2019  . Tdap 07/23/2019    5) Disposition - pending delivery  14/04/2019, MD, [facility], [facility] 08/28/2019, 8:57 AM

## 2019-08-29 ENCOUNTER — Encounter: Payer: Self-pay | Admitting: Obstetrics and Gynecology

## 2019-08-29 DIAGNOSIS — O4290 Premature rupture of membranes, unspecified as to length of time between rupture and onset of labor, unspecified weeks of gestation: Secondary | ICD-10-CM | POA: Diagnosis present

## 2019-08-29 LAB — BPAM RBC
Blood Product Expiration Date: 202102112359
Blood Product Expiration Date: 202102112359
Unit Type and Rh: 9500
Unit Type and Rh: 9500

## 2019-08-29 LAB — TYPE AND SCREEN
ABO/RH(D): O NEG
Antibody Screen: POSITIVE
Unit division: 0
Unit division: 0

## 2019-08-29 LAB — CBC
HCT: 30.6 % — ABNORMAL LOW (ref 36.0–46.0)
Hemoglobin: 9.8 g/dL — ABNORMAL LOW (ref 12.0–15.0)
MCH: 27.5 pg (ref 26.0–34.0)
MCHC: 32 g/dL (ref 30.0–36.0)
MCV: 86 fL (ref 80.0–100.0)
Platelets: 253 10*3/uL (ref 150–400)
RBC: 3.56 MIL/uL — ABNORMAL LOW (ref 3.87–5.11)
RDW: 14.1 % (ref 11.5–15.5)
WBC: 10.8 10*3/uL — ABNORMAL HIGH (ref 4.0–10.5)
nRBC: 0 % (ref 0.0–0.2)

## 2019-08-29 LAB — RPR: RPR Ser Ql: NONREACTIVE

## 2019-08-29 MED ORDER — ACETAMINOPHEN 325 MG PO TABS: 650.0000 mg | ORAL_TABLET | ORAL | Status: DC | PRN

## 2019-08-29 MED ORDER — OMEPRAZOLE 40 MG PO CPDR: 40.0000 mg | DELAYED_RELEASE_CAPSULE | Freq: Every day | ORAL | 11 refills | Status: DC | PRN

## 2019-08-29 MED ORDER — IBUPROFEN 600 MG PO TABS: 600.0000 mg | ORAL_TABLET | Freq: Four times a day (QID) | ORAL | 0 refills | Status: DC | PRN

## 2019-08-29 NOTE — Lactation Note (Signed)
This note was copied from a baby's chart. Lactation Consultation Note  Patient Name: Becky Mccoy [id number] Date: 08/29/2019 Reason for consult: Initial assessment Mom states she breastfed her other children, Becky 33 yr  Old and daughter 59 yrs old, for 11 mths, no problems reported, she states this baby is latching easily, had circ this am and nursed after coming to room, she reports she is able to express colostrum that is already transitioning and has observed colostrum mid pregnancy.  [facility] name and phone no. Written on white board for questions/ concerns, coconut oil given with q tips and instruction in use   Maternal Data Formula Feeding for Exclusion: No Does the patient have breastfeeding experience prior to this delivery?: Yes  Feeding Feeding Type: Breast Fed  LATCH Score Latch: (did not observe a feeding)                 Interventions Interventions: Coconut oil  Lactation Tools Discussed/Used WIC Program: No   Consult Status Consult Status: PRN    Dyann Kief 08/29/2019, 10:23 AM

## 2019-08-29 NOTE — Anesthesia Postprocedure Evaluation (Signed)
Anesthesia Post Note  Patient: Becky Mccoy  Procedure(s) Performed: AN AD HOC LABOR EPIDURAL  Patient location during evaluation: Mother Baby Anesthesia Type: Epidural Level of consciousness: awake and alert Pain management: pain level controlled Vital Signs Assessment: post-procedure vital signs reviewed and stable Respiratory status: spontaneous breathing, nonlabored ventilation and respiratory function stable Cardiovascular status: stable Postop Assessment: no headache, no backache and epidural receding Anesthetic complications: no     Last Vitals:  Vitals:   08/29/19 0113 08/29/19 0435  BP: 109/71 96/66  Pulse: 92 91  Resp: 18   Temp: 36.8 C 36.5 C  SpO2: 100% 96%    Last Pain:  Vitals:   08/29/19 0435  TempSrc: Oral  PainSc: 0-No pain                 Jaelene Garciagarcia Lawerance Cruel

## 2019-08-29 NOTE — Lactation Note (Addendum)
This note was copied from a baby's chart. Lactation Consultation Note  Patient Name: Becky Mccoy [id number] Date: 08/29/2019 Reason for consult: Follow-up assessment   Maternal Data Formula Feeding for Exclusion: No Has patient been taught Hand Expression?: Yes Does the patient have breastfeeding experience prior to this delivery?: Yes Mom can easily hand express colostrum Feeding Feeding Type: Breast Fed Sleepy at breast after circ, was dispalying feeding cues, but once latched well has good sucking pattern with occ. Swallows, tends to pull in lower lip/jaw but opens wider with gentle pressure on chin, various positions tried with side lying easier at this point since he is so sleepy after circ.  LATCH Score Latch: Grasps breast easily, tongue down, lips flanged, rhythmical sucking.  Audible Swallowing: A few with stimulation  Type of Nipple: Everted at rest and after stimulation  Comfort (Breast/Nipple): Filling, red/small blisters or bruises, mild/mod discomfort  Hold (Positioning): Assistance needed to correctly position infant at breast and maintain latch.  LATCH Score: 7  Interventions Interventions: Breast feeding basics reviewed;Assisted with latch;Skin to skin;Breast massage;Hand express;Breast compression;Adjust position;Support pillows  Lactation Tools Discussed/Used WIC Program: No   Consult Status Consult Status: PRN    Dyann Kief 08/29/2019, 1:45 PM

## 2019-08-29 NOTE — Discharge Instructions (Signed)
°Vaginal Delivery, Care After °Refer to this sheet in the next few weeks. These discharge instructions provide you with information on caring for yourself after delivery. Your caregiver may also give you specific instructions. Your treatment has been planned according to the most current medical practices available, but problems sometimes occur. Call your caregiver if you have any problems or questions after you go home. °HOME CARE INSTRUCTIONS °1. Take over-the-counter or prescription medicines only as directed by your caregiver or pharmacist. °2. Do not drink alcohol, especially if you are breastfeeding or taking medicine to relieve pain. °3. Do not smoke tobacco. °4. Continue to use good perineal care. Good perineal care includes: °1. Wiping your perineum from back to front °2. Keeping your perineum clean. °3. You can do sitz baths twice a day, to help keep this area clean °5. Do not use tampons, douche or have sex for 6 weeks °6. Shower only and avoid sitting in submerged water, aside from sitz baths °7. Wear a well-fitting bra that provides breast support. °8. Eat healthy foods. °9. Drink enough fluids to keep your urine clear or pale yellow. °10. Eat high-fiber foods such as whole grain cereals and breads, brown rice, beans, and fresh fruits and vegetables every day. These foods may help prevent or relieve constipation. °11. Avoid constipation with high fiber foods or medications, such as miralax or metamucil °12. Follow your caregiver's recommendations regarding resumption of activities such as climbing stairs, driving, lifting, exercising, or traveling. °13. Talk to your caregiver about resuming sexual activities. Resumption of sexual activities after 6 weeks is dependent upon your risk of infection, your rate of healing, and your comfort and desire to resume sexual activity. °14. Try to have someone help you with your household activities and your newborn for at least a few days after you leave the  hospital. °15. Rest as much as possible. Try to rest or take a nap when your newborn is sleeping. °16. Increase your activities gradually. °17. Keep all of your scheduled postpartum appointments. It is very important to keep your scheduled follow-up appointments. At these appointments, your caregiver will be checking to make sure that you are healing physically and emotionally. °SEEK MEDICAL CARE IF:  °· You are passing large clots from your vagina. Save any clots to show your caregiver. °· You have a foul smelling discharge from your vagina. °· You have trouble urinating. °· You are urinating frequently. °· You have pain when you urinate. °· You have a change in your bowel movements. °· You have increasing redness, pain, or swelling near your vaginal incision (episiotomy) or vaginal tear. °· You have pus draining from your episiotomy or vaginal tear. °· Your episiotomy or vaginal tear is separating. °· You have painful, hard, or reddened breasts. °· You have a severe headache. °· You have blurred vision or see spots. °· You feel sad or depressed. °· You have thoughts of hurting yourself or your newborn. °· You have questions about your care, the care of your newborn, or medicines. °· You are dizzy or light-headed. °· You have a rash. °· You have nausea or vomiting. °· You were breastfeeding and have not had a menstrual period within 12 weeks after you stopped breastfeeding. °· You are not breastfeeding and have not had a menstrual period by the 12th week after delivery. °· You have a fever of 100.5 or more °SEEK IMMEDIATE MEDICAL CARE IF:  °· You have persistent pain. °· You have chest pain. °· You have shortness   of breath. °· You faint. °· You have leg pain. °· You have stomach pain. °· Your vaginal bleeding saturates two or more sanitary pads in 1 hour. °MAKE SURE YOU:  °· Understand these instructions. °· Will watch your condition. °· Will get help right away if you are not doing well or get worse. °Document  Released: 07/28/2000 Document Revised: 12/15/2013 Document Reviewed: 03/27/2012 °ExitCare® Patient Information ©2015 [employer]. This information is not intended to replace advice given to you by your health care provider. Make sure you discuss any questions you have with your health care provider. ° °Sitz Bath °A sitz bath is a warm water bath taken in the sitting position. The water covers only the hips and butt (buttocks). We recommend using one that fits in the toilet, to help with ease of use and cleanliness. It may be used for either healing or cleaning purposes. Sitz baths are also used to relieve pain, itching, or muscle tightening (spasms). The water may contain medicine. Moist heat will help you heal and relax.  °HOME CARE  °Take 3 to 4 sitz baths a day. °18. Fill the bathtub half-full with warm water. °19. Sit in the water and open the drain a little. °20. Turn on the warm water to keep the tub half-full. Keep the water running constantly. °21. Soak in the water for 15 to 20 minutes. °22. After the sitz bath, pat the affected area dry. °GET HELP RIGHT AWAY IF: °You get worse instead of better. Stop the sitz baths if you get worse. °MAKE SURE YOU: °· Understand these instructions. °· Will watch your condition. °· Will get help right away if you are not doing well or get worse. °Document Released: 09/07/2004 Document Revised: 04/24/2012 Document Reviewed: 11/28/2010 °ExitCare® Patient Information ©2015 [employer]. This information is not intended to replace advice given to you by your health care provider. Make sure you discuss any questions you have with your health care provider. ° ° °

## 2019-08-29 NOTE — Progress Notes (Signed)
Post Partum Day 1 Subjective: no complaints, up ad lib, voiding and tolerating PO. Breast feeding going well. Baby had circumcision this AM. Would like to go home later tonight when baby discharged.  Objective: Blood pressure (!) 119/56, pulse 89, temperature 98.3 F (36.8 C), temperature source Oral, resp. rate 18, height 5\' 6"  (1.676 m), weight 82.1 kg, last menstrual period 11/28/2018, SpO2 99 %, unknown if currently breastfeeding.  Physical Exam:  General: alert, cooperative and no distress Lochia: appropriate Uterine Fundus: firm Perineum: intact and healing well DVT Evaluation: No evidence of DVT seen on physical exam.  Recent Labs    08/28/19 1036 08/29/19 0604  HGB 10.7* 9.8*  HCT 32.8* 30.6*  WBC 10.5 10.8*  PLT 262 253   Results for orders placed or performed during the hospital encounter of 08/28/19 (from the past 24 hour(s))  CBC     Status: Abnormal   Collection Time: 08/28/19 10:36 AM  Result Value Ref Range   WBC 10.5 4.0 - 10.5 K/uL   RBC 3.88 3.87 - 5.11 MIL/uL   Hemoglobin 10.7 (L) 12.0 - 15.0 g/dL   HCT 08/30/19 (L) 21.1 - 94.1 %   MCV 84.5 80.0 - 100.0 fL   MCH 27.6 26.0 - 34.0 pg   MCHC 32.6 30.0 - 36.0 g/dL   RDW 74.0 81.4 - 48.1 %   Platelets 262 150 - 400 K/uL   nRBC 0.0 0.0 - 0.2 %  Type and screen [facility]     Status: None   Collection Time: 08/28/19 10:36 AM  Result Value Ref Range   ABO/RH(D) O NEG    Antibody Screen POS    Sample Expiration 08/31/2019,2359    Antibody Identification      PASSIVELY ACQUIRED ANTI-D NON SPECIFIC ANTIBODY REACTIVITY   Unit Number 09/02/2019    Blood Component Type RED CELLS,LR    Unit division 00    Status of Unit REL FROM [facility]    Transfusion Status OK TO TRANSFUSE    Crossmatch Result COMPATIBLE    Unit Number [facility]    Blood Component Type RED CELLS,LR    Unit division 00    Status of Unit REL FROM [facility]    Transfusion Status OK TO TRANSFUSE    Crossmatch Result  COMPATIBLE   ABO/Rh     Status: None   Collection Time: 08/28/19 12:48 PM  Result Value Ref Range   ABO/RH(D)      O NEG Performed at [facility], [address] [place], [place]   CBC     Status: Abnormal   Collection Time: 08/29/19  6:04 AM  Result Value Ref Range   WBC 10.8 (H) 4.0 - 10.5 K/uL   RBC 3.56 (L) 3.87 - 5.11 MIL/uL   Hemoglobin 9.8 (L) 12.0 - 15.0 g/dL   HCT 08/31/19 (L) 94.7 - 09.6 %   MCV 86.0 80.0 - 100.0 fL   MCH 27.5 26.0 - 34.0 pg   MCHC 32.0 30.0 - 36.0 g/dL   RDW 28.3 66.2 - 94.7 %   Platelets 253 150 - 400 K/uL   nRBC 0.0 0.0 - 0.2 %   Information for the patient's newborn:  Orelia, [address]  A NEG   Assessment/Plan: Stable PPD #1-continue postpartum care  DIscharge later today if baby discharged Mother and baby RH negative Rhogam not indicated RI/ VI/ TDAP given AP Flu vaccine UTD Breast Vasectomy   LOS: 1 day   [[id number]]  08/29/2019, 9:51 AM

## 2019-08-29 NOTE — Progress Notes (Signed)
Patient discharged home with infant. Discharge instructions reviewed and given to pt. Pt verbalized understanding. Escorted out by staff.  

## 2019-09-04 ENCOUNTER — Inpatient Hospital Stay: Admit: 2019-09-04 | Payer: Self-pay

## 2019-09-18 ENCOUNTER — Encounter: Payer: Self-pay | Admitting: Obstetrics and Gynecology

## 2019-09-18 ENCOUNTER — Ambulatory Visit (INDEPENDENT_AMBULATORY_CARE_PROVIDER_SITE_OTHER): Payer: 59 | Admitting: Obstetrics and Gynecology

## 2019-09-18 ENCOUNTER — Other Ambulatory Visit: Payer: Self-pay

## 2019-09-18 VITALS — BP 100/70 | Ht 66.0 in | Wt 155.0 lb

## 2019-09-18 DIAGNOSIS — B3789 Other sites of candidiasis: Secondary | ICD-10-CM | POA: Diagnosis not present

## 2019-09-18 DIAGNOSIS — R197 Diarrhea, unspecified: Secondary | ICD-10-CM

## 2019-09-18 DIAGNOSIS — O9102 Infection of nipple associated with the puerperium: Secondary | ICD-10-CM

## 2019-09-18 MED ORDER — NYSTATIN 100000 UNIT/GM EX CREA: 1.0000 "application " | TOPICAL_CREAM | Freq: Two times a day (BID) | CUTANEOUS | 0 refills | Status: DC | PRN

## 2019-09-18 NOTE — Progress Notes (Signed)
Dale Laona, MD   Chief Complaint  Patient presents with  . Breast exam    both nipples are red and itchy    HPI:      Ms. Becky Mccoy is a 34 y.o. [phone] who LMP was Patient's last menstrual period was 11/28/2018., presents today for yeast on bilat nipples. Having redness, itch, feel cracked. Is currently breastfeeding, son diagnosed with thrush yesterday. MD gave pt diflucan to take for the wk. Took 2 tabs last night and developed nausea/diarrhea, itching and rash. Throat started to feel thick this AM. Took benadryl and sx improved. Still has nipple sx. Needs new Rx. Also with rectal bleeding due to diarrhea.    Patient Active Problem List   Diagnosis Date Noted  . Premature rupture of membranes (PROM) affecting fourth pregnancy 08/29/2019  . Normal vaginal delivery 08/29/2019  . Postpartum care following vaginal delivery 08/29/2019  . Encounter for supervision of low-risk pregnancy, antepartum 01/09/2019  . Hypercholesterolemia 06/18/2018  . Nipple discharge 12/11/2016  . Chest pain 12/11/2016  . Health care maintenance 02/25/2015  . Thyroid fullness 06/07/2014  . OCD (obsessive compulsive disorder) 06/07/2014    Past Surgical History:  Procedure Laterality Date  . WISDOM TOOTH EXTRACTION      Family History  Problem Relation Age of Onset  . Healthy Mother   . Healthy Father   . Cancer - Other Maternal Grandfather        stomach cancer  . Cancer - Other Paternal Grandfather        Bone cancer    Social History   Socioeconomic History  . Marital status: Married    Spouse name: Not on file  . Number of children: Not on file  . Years of education: Not on file  . Highest education level: Not on file  Occupational History  . Not on file  Tobacco Use  . Smoking status: Never Smoker  . Smokeless tobacco: Never Used  Substance and Sexual Activity  . Alcohol use: No    Alcohol/week: 0.0 standard drinks  . Drug use: No  . Sexual activity: Yes   Birth control/protection: None    Comment: Vasectomy  Other Topics Concern  . Not on file  Social History Narrative  . Not on file   Social Determinants of Health   Financial Resource Strain:   . Difficulty of Paying Living Expenses: Not on file  Food Insecurity:   . Worried About Programme researcher, broadcasting/film/video in the Last Year: Not on file  . Ran Out of Food in the Last Year: Not on file  Transportation Needs:   . Lack of Transportation (Medical): Not on file  . Lack of Transportation (Non-Medical): Not on file  Physical Activity:   . Days of Exercise per Week: Not on file  . Minutes of Exercise per Session: Not on file  Stress:   . Feeling of Stress : Not on file  Social Connections:   . Frequency of Communication with Friends and Family: Not on file  . Frequency of Social Gatherings with Friends and Family: Not on file  . Attends Religious Services: Not on file  . Active Member of Clubs or Organizations: Not on file  . Attends Banker Meetings: Not on file  . Marital Status: Not on file  Intimate Partner Violence:   . Fear of Current or Ex-Partner: Not on file  . Emotionally Abused: Not on file  . Physically Abused: Not on file  .  Sexually Abused: Not on file    Outpatient Medications Prior to Visit  Medication Sig Dispense Refill  . acetaminophen (TYLENOL) 325 MG tablet Take 2 tablets (650 mg total) by mouth every 4 (four) hours as needed (for pain scale < 4).    Marland Kitchen FLUoxetine (PROZAC) 20 MG capsule TAKE 4 CAPSULES BY MOUTH ONCE DAILY PT  TAKE  80  MG  ONCE  A  DAY 360 capsule 1  . ibuprofen (ADVIL) 600 MG tablet Take 1 tablet (600 mg total) by mouth every 6 (six) hours as needed. 30 tablet 0  . prenatal vitamin w/FE, FA (PRENATAL 1 + 1) 27-1 MG TABS tablet Take 1 tablet by mouth daily at 12 noon.    Marland Kitchen omeprazole (PRILOSEC) 40 MG capsule Take 1 capsule (40 mg total) by mouth daily as needed (heartburn or reflux). (Patient not taking: Reported on 09/18/2019) 30 capsule 11     No facility-administered medications prior to visit.      ROS:  Review of Systems  Constitutional: Negative for fever.  Gastrointestinal: Negative for blood in stool, constipation, diarrhea, nausea and vomiting.  Genitourinary: Negative for dyspareunia, dysuria, flank pain, frequency, hematuria, urgency, vaginal bleeding, vaginal discharge and vaginal pain.  Musculoskeletal: Negative for back pain.  Skin: Negative for rash.   BREAST: rash, itch   OBJECTIVE:   Vitals:  BP 100/70   Ht 5\' 6"  (1.676 m)   Wt 155 lb (70.3 kg)   LMP 11/28/2018   Breastfeeding Yes   BMI 25.02 kg/m   Physical Exam Vitals reviewed.  Pulmonary:     Effort: Pulmonary effort is normal.  Chest:     Breasts: Breasts are symmetrical.        Right: No inverted nipple, mass, nipple discharge, skin change or tenderness.        Left: No inverted nipple, mass, nipple discharge, skin change or tenderness.     Comments: BILAT NIPPLES WITH ERYTHEMA Musculoskeletal:        General: Normal range of motion.     Cervical back: Normal range of motion.  Skin:    General: Skin is warm and dry.  Neurological:     General: No focal deficit present.     Mental Status: She is alert and oriented to person, place, and time.     Cranial Nerves: No cranial nerve deficit.  Psychiatric:        Mood and Affect: Mood normal.        Behavior: Behavior normal.        Thought Content: Thought content normal.        Judgment: Judgment normal.    Assessment/Plan: Yeast infection of nipple, postpartum - Plan: nystatin cream (MYCOSTATIN), --Try nystatin instead. D/c diflucan. F/u prn.  Rectal bleeding after diarrhea--sitz baths, tucks pads. Bland diet. F/u prn.   Meds ordered this encounter  Medications  . DISCONTD: nystatin cream (MYCOSTATIN)    Sig: Apply 1 application topically 2 (two) times daily as needed for dry skin.    Dispense:  15 g    Refill:  0    Order Specific Question:   Supervising Provider     Answer:   Gae Dry [id number]  . nystatin cream (MYCOSTATIN)    Sig: Apply 1 application topically 2 (two) times daily as needed for dry skin.    Dispense:  15 g    Refill:  0    Order Specific Question:   Supervising Provider    Answer:  Nadara Mustard [[id number]]      Return if symptoms worsen or fail to improve.  Aundre Hietala B. Lorence Nagengast, PA-C 09/18/2019 2:30 PM

## 2019-09-18 NOTE — Patient Instructions (Signed)
I value your feedback and entrusting us with your care. If you get a [facility] patient survey, I would appreciate you taking the time to let us know about your experience today. Thank you!  As of July 24, 2019, your lab results will be released to your MyChart immediately, before I even have a chance to see them. Please give me time to review them and contact you if there are any abnormalities. Thank you for your patience.  

## 2019-10-09 ENCOUNTER — Ambulatory Visit (INDEPENDENT_AMBULATORY_CARE_PROVIDER_SITE_OTHER): Payer: 59 | Admitting: Obstetrics and Gynecology

## 2019-10-09 ENCOUNTER — Ambulatory Visit: Payer: 59 | Admitting: Obstetrics and Gynecology

## 2019-10-09 ENCOUNTER — Other Ambulatory Visit: Payer: Self-pay

## 2019-10-09 DIAGNOSIS — Z1332 Encounter for screening for maternal depression: Secondary | ICD-10-CM | POA: Diagnosis not present

## 2019-10-09 NOTE — Progress Notes (Signed)
Postpartum Visit  Chief Complaint: No chief complaint on file.   History of Present Illness: Patient is a 34 y.o. [id number] presents for postpartum visit.  Review the Delivery Report for details.  Delivery Note At 6:00 PM on 08/28/2019 a viable female was delivered via Vaginal, Spontaneous (Presentation:   Occiput Anterior).  APGAR: 6, 8; weight 7 lb 12.2 oz (3520 g).   Placenta status: Spontaneous, Intact.  Cord: 3 vessels with the following complications: None.  Cord pH: N/A  Anesthesia: Epidural Episiotomy: None Lacerations: 1st degree Est. Blood Loss (mL): 455 Pregnancy or labor problems:  no Any problems since the delivery:  no  Maternal Details:  Breast or formula feeding: plans to breastfeed Any bowel or bladder issues: No  Post partum depression/anxiety noted:  no Edinburgh Post-Partum Depression Score:3 Date of last PAP: 05/23/2018  NIL HPV negative   Review of Systems: Review of Systems  Constitutional: Negative.   Gastrointestinal: Negative.   Genitourinary: Negative.   Psychiatric/Behavioral: Negative.     The following portions of the patient's history were reviewed and updated as appropriate: allergies, current medications, past family history, past medical history, past social history, past surgical history and problem list.  Past Medical History:  Past Medical History:  Diagnosis Date  . History of chicken pox   . Hx: UTI (urinary tract infection)   . Obsessive-compulsive and related disorder   . OCD (obsessive compulsive disorder)     Past Surgical History:  Past Surgical History:  Procedure Laterality Date  . WISDOM TOOTH EXTRACTION      Family History:  Family History  Problem Relation Age of Onset  . Healthy Mother   . Healthy Father   . Cancer - Other Maternal Grandfather        stomach cancer  . Cancer - Other Paternal Grandfather        Bone cancer    Social History:  Social History   Socioeconomic History  . Marital status:  Married    Spouse name: Not on file  . Number of children: Not on file  . Years of education: Not on file  . Highest education level: Not on file  Occupational History  . Not on file  Tobacco Use  . Smoking status: Never Smoker  . Smokeless tobacco: Never Used  Substance and Sexual Activity  . Alcohol use: No    Alcohol/week: 0.0 standard drinks  . Drug use: No  . Sexual activity: Yes    Birth control/protection: None    Comment: Vasectomy  Other Topics Concern  . Not on file  Social History Narrative  . Not on file   Social Determinants of Health   Financial Resource Strain:   . Difficulty of Paying Living Expenses: Not on file  Food Insecurity:   . Worried About Programme researcher, broadcasting/film/video in the Last Year: Not on file  . Ran Out of Food in the Last Year: Not on file  Transportation Needs:   . Lack of Transportation (Medical): Not on file  . Lack of Transportation (Non-Medical): Not on file  Physical Activity:   . Days of Exercise per Week: Not on file  . Minutes of Exercise per Session: Not on file  Stress:   . Feeling of Stress : Not on file  Social Connections:   . Frequency of Communication with Friends and Family: Not on file  . Frequency of Social Gatherings with Friends and Family: Not on file  . Attends Religious Services:  Not on file  . Active Member of Clubs or Organizations: Not on file  . Attends Banker Meetings: Not on file  . Marital Status: Not on file  Intimate Partner Violence:   . Fear of Current or Ex-Partner: Not on file  . Emotionally Abused: Not on file  . Physically Abused: Not on file  . Sexually Abused: Not on file    Allergies:  Allergies  Allergen Reactions  . Sulfa Antibiotics     Medications: Prior to Admission medications   Medication Sig Start Date End Date Taking? Authorizing Provider  acetaminophen (TYLENOL) 325 MG tablet Take 2 tablets (650 mg total) by mouth every 4 (four) hours as needed (for pain scale < 4).  08/29/19   Farrel Conners, CNM  FLUoxetine (PROZAC) 20 MG capsule TAKE 4 CAPSULES BY MOUTH ONCE DAILY PT  TAKE  80  MG  ONCE  A  DAY 04/28/19   Cottle, Steva Ready., MD  ibuprofen (ADVIL) 600 MG tablet Take 1 tablet (600 mg total) by mouth every 6 (six) hours as needed. 08/29/19   Farrel Conners, CNM  nystatin cream (MYCOSTATIN) Apply 1 application topically 2 (two) times daily as needed for dry skin. 09/18/19   Copland, Ilona Sorrel, PA-C  omeprazole (PRILOSEC) 40 MG capsule Take 1 capsule (40 mg total) by mouth daily as needed (heartburn or reflux). Patient not taking: Reported on 09/18/2019 08/29/19   Farrel Conners, CNM  prenatal vitamin w/FE, FA (PRENATAL 1 + 1) 27-1 MG TABS tablet Take 1 tablet by mouth daily at 12 noon.    [provider]    Physical Exam Blood pressure 120/76, height 5\' 6"  (1.676 m), weight 161 lb (73 kg), last menstrual period 11/28/2018, currently breastfeeding.  General: NAD HEENT: normocephalic, anicteric Pulmonary: No increased work of breathing Abdomen: NABS, soft, non-tender, non-distended.  Umbilicus without lesions.  No hepatomegaly, splenomegaly or masses palpable. No evidence of hernia. Genitourinary:  External: Normal external female genitalia.  Normal urethral meatus, normal  Bartholin's and Skene's glands.    Vagina: Normal vaginal mucosa, no evidence of prolapse.    Cervix: Grossly normal in appearance, no bleeding  Uterus: Non-enlarged, mobile, normal contour.  No CMT  Adnexa: ovaries non-enlarged, no adnexal masses  Rectal: deferred Extremities: no edema, erythema, or tenderness Neurologic: Grossly intact Psychiatric: mood appropriate, affect full    Edinburgh Postnatal Depression Scale - 10/09/19 1600      Edinburgh Postnatal Depression Scale:  In the Past 7 Days   I have been able to laugh and see the funny side of things.  0    I have looked forward with enjoyment to things.  0    I have blamed myself unnecessarily when things went  wrong.  0    I have been anxious or worried for no good reason.  1    I have felt scared or panicky for no good reason.  0    Things have been getting on top of me.  1    I have been so unhappy that I have had difficulty sleeping.  0    I have felt sad or miserable.  0    I have been so unhappy that I have been crying.  1    The thought of harming myself has occurred to me.  0    Edinburgh Postnatal Depression Scale Total  3        Assessment: 34 y.o. 34 presenting for 6 week postpartum visit  Plan: Problem List Items Addressed This Visit    None       1) Contraception - Education given regarding options for contraception, as well as compatibility with breast feeding if applicable.  Patient plans on vasectomy for contraception.  2)  Pap - ASCCP guidelines and rational discussed.  ASCCP guidelines and rational discussed.  Patient opts for every 3 years screening interval  3) Patient underwent screening for postpartum depression with no signs of depression  4) No follow-ups on file.   Malachy Mood, MD, [facility], [facility] 10/09/2019, 12:33 PM

## 2019-12-10 ENCOUNTER — Encounter: Payer: Self-pay | Admitting: Internal Medicine

## 2019-12-22 ENCOUNTER — Other Ambulatory Visit (INDEPENDENT_AMBULATORY_CARE_PROVIDER_SITE_OTHER): Payer: 59

## 2019-12-22 ENCOUNTER — Other Ambulatory Visit: Payer: Self-pay

## 2019-12-22 DIAGNOSIS — E7800 Pure hypercholesterolemia, unspecified: Secondary | ICD-10-CM

## 2019-12-23 LAB — COMPREHENSIVE METABOLIC PANEL
ALT: 27 U/L (ref 0–35)
AST: 21 U/L (ref 0–37)
Albumin: 4.6 g/dL (ref 3.5–5.2)
Alkaline Phosphatase: 127 U/L — ABNORMAL HIGH (ref 39–117)
BUN: 19 mg/dL (ref 6–23)
CO2: 27 mEq/L (ref 19–32)
Calcium: 9.4 mg/dL (ref 8.4–10.5)
Chloride: 103 mEq/L (ref 96–112)
Creatinine, Ser: 0.64 mg/dL (ref 0.40–1.20)
GFR: 106.84 mL/min (ref >60.00)
Glucose, Bld: 78 mg/dL (ref 70–99)
Potassium: 4 mEq/L (ref 3.5–5.1)
Sodium: 139 mEq/L (ref 135–145)
Total Bilirubin: 0.4 mg/dL (ref 0.2–1.2)
Total Protein: 7.2 g/dL (ref 6.0–8.3)

## 2019-12-23 LAB — CBC WITH DIFFERENTIAL/PLATELET
Basophils Absolute: 0.1 10*3/uL (ref 0.0–0.1)
Basophils Relative: 1.1 % (ref 0.0–3.0)
Eosinophils Absolute: 0.2 10*3/uL (ref 0.0–0.7)
Eosinophils Relative: 3.1 % (ref 0.0–5.0)
HCT: 39.8 % (ref 36.0–46.0)
Hemoglobin: 13.6 g/dL (ref 12.0–15.0)
Lymphocytes Relative: 39.4 % (ref 12.0–46.0)
Lymphs Abs: 3.1 10*3/uL (ref 0.7–4.0)
MCHC: 34.1 g/dL (ref 30.0–36.0)
MCV: 85.3 fl (ref 78.0–100.0)
Monocytes Absolute: 0.5 10*3/uL (ref 0.1–1.0)
Monocytes Relative: 6 % (ref 3.0–12.0)
Neutro Abs: 4 10*3/uL (ref 1.4–7.7)
Neutrophils Relative %: 50.4 % (ref 43.0–77.0)
Platelets: 310 10*3/uL (ref 150.0–400.0)
RBC: 4.67 Mil/uL (ref 3.87–5.11)
RDW: 14.4 % (ref 11.5–15.5)
WBC: 8 10*3/uL (ref 4.0–10.5)

## 2019-12-23 LAB — LDL CHOLESTEROL, DIRECT: Direct LDL: 153 mg/dL

## 2019-12-23 LAB — LIPID PANEL
Cholesterol: 253 mg/dL — ABNORMAL HIGH (ref 0–200)
HDL: 41.8 mg/dL (ref >39.00)
NonHDL: 211.38
Total CHOL/HDL Ratio: 6
Triglycerides: 336 mg/dL — ABNORMAL HIGH (ref 0.0–149.0)
VLDL: 67.2 mg/dL — ABNORMAL HIGH (ref 0.0–40.0)

## 2019-12-23 LAB — TSH: TSH: 2.24 u[IU]/mL (ref 0.35–4.50)

## 2019-12-24 ENCOUNTER — Telehealth: Payer: Self-pay | Admitting: Internal Medicine

## 2019-12-24 NOTE — Telephone Encounter (Signed)
Pt returned your call for lab results.  

## 2019-12-24 NOTE — Telephone Encounter (Signed)
See result note.  

## 2020-01-14 ENCOUNTER — Telehealth: Payer: Self-pay | Admitting: Psychiatry

## 2020-01-14 ENCOUNTER — Other Ambulatory Visit: Payer: Self-pay

## 2020-01-14 DIAGNOSIS — F429 Obsessive-compulsive disorder, unspecified: Secondary | ICD-10-CM

## 2020-01-14 MED ORDER — FLUOXETINE HCL 20 MG PO CAPS: ORAL_CAPSULE | ORAL | 0 refills | Status: DC

## 2020-01-14 NOTE — Telephone Encounter (Signed)
RX sent with a change in pharmacy to [facility] in [place]

## 2020-01-14 NOTE — Telephone Encounter (Signed)
Patient called and made an appointment for 8/2. She needs a refill on her prozac 20 mg to be sent to the [facility] in [place]  On [address]

## 2020-02-13 IMAGING — DX DG LUMBAR SPINE COMPLETE 4+V
5 series · 5 of 5 positions shown · non-contrast
Comparison: None.

CLINICAL DATA: 32-year-old female with lower back pain for several
months off and on. Caregiver lifting patients throughout the day.
Initial encounter.

EXAM:
LUMBAR SPINE - COMPLETE 4+ VIEW

[lumbar spine ap]
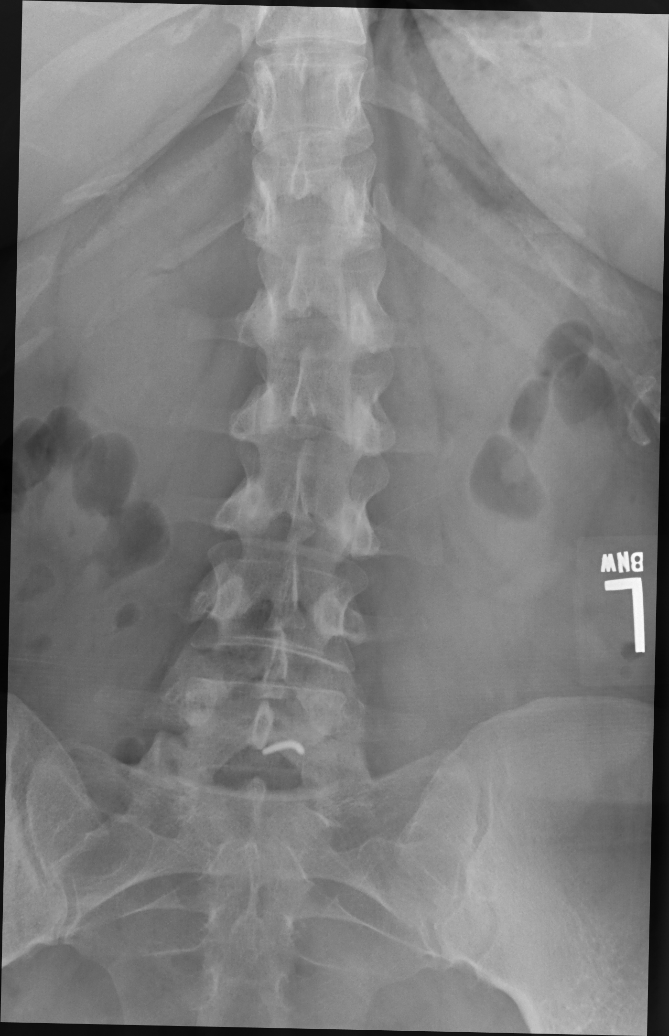

[lumbar spine obl (oblique) (1 of 2)]
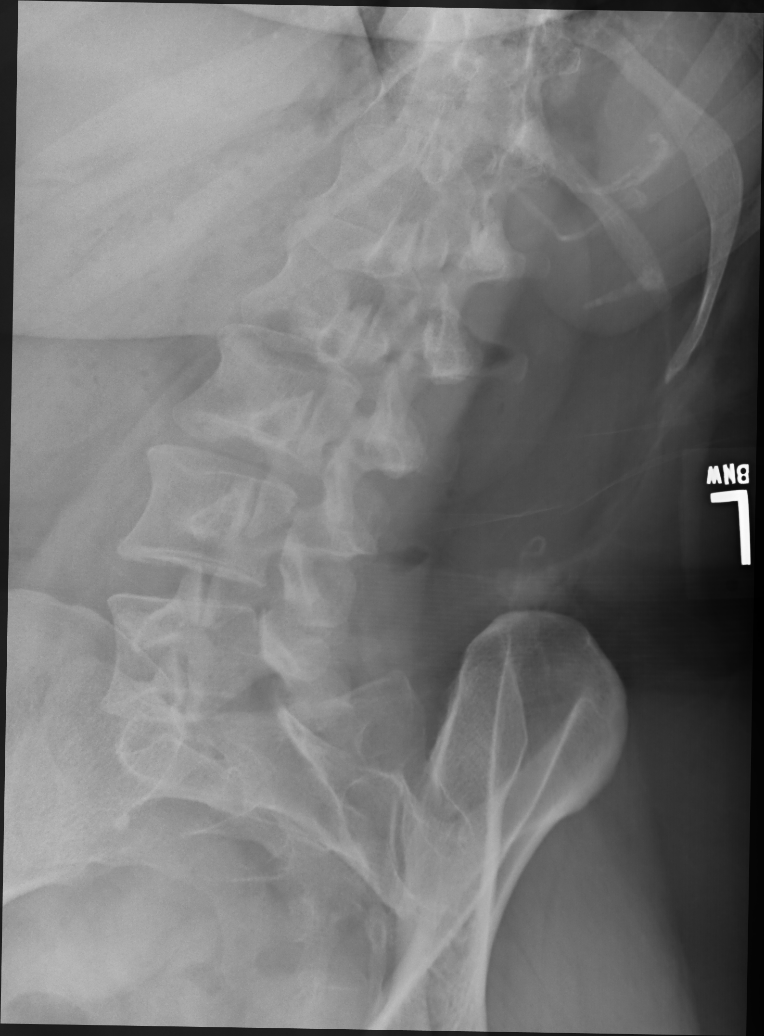

[lumbar spine obl (oblique) (2 of 2)]
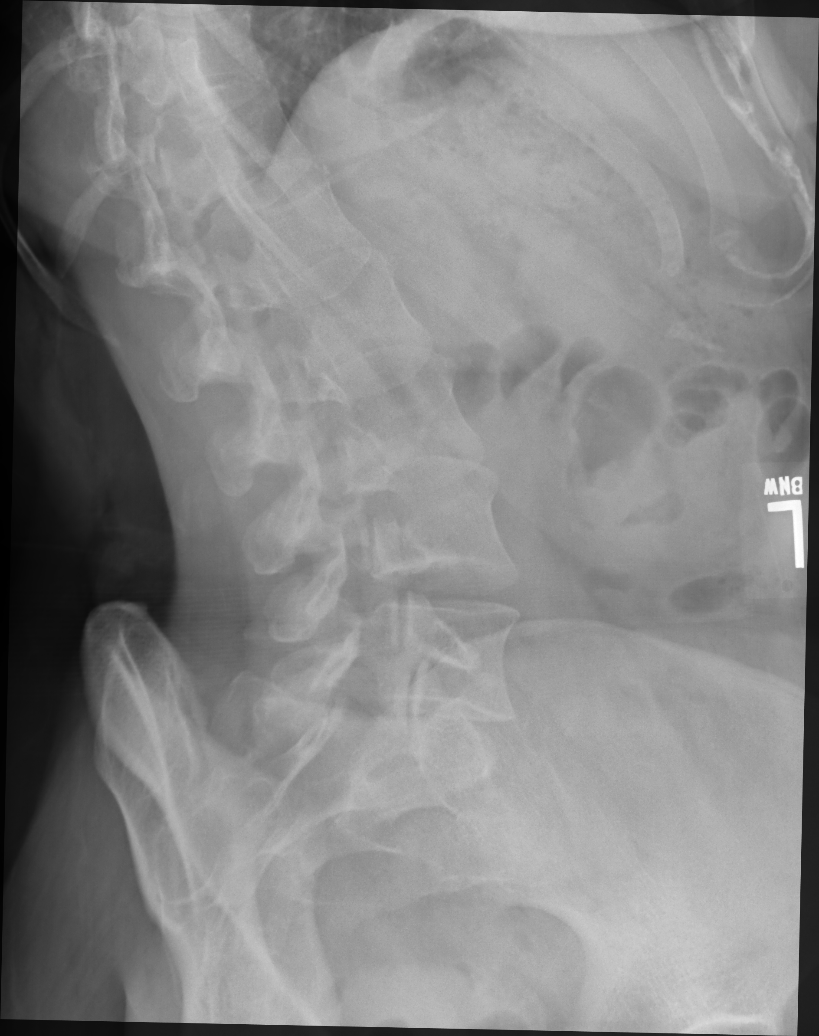

[lumbar spine lat]
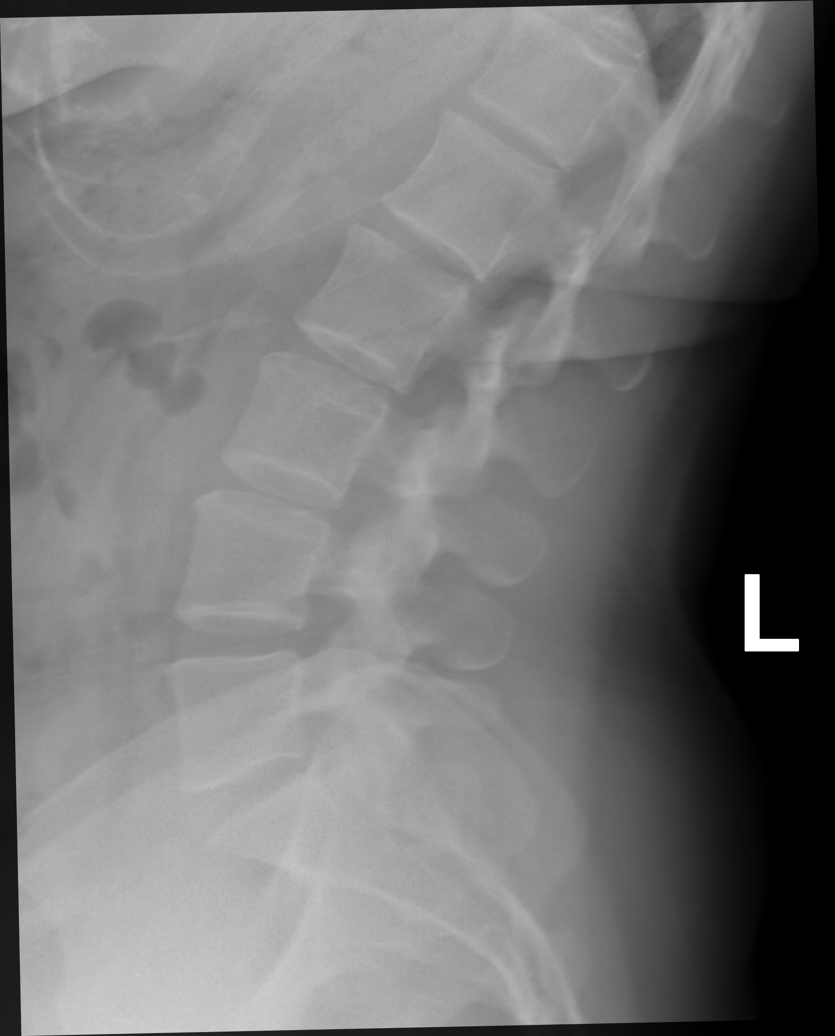

[lumbar spot lat]
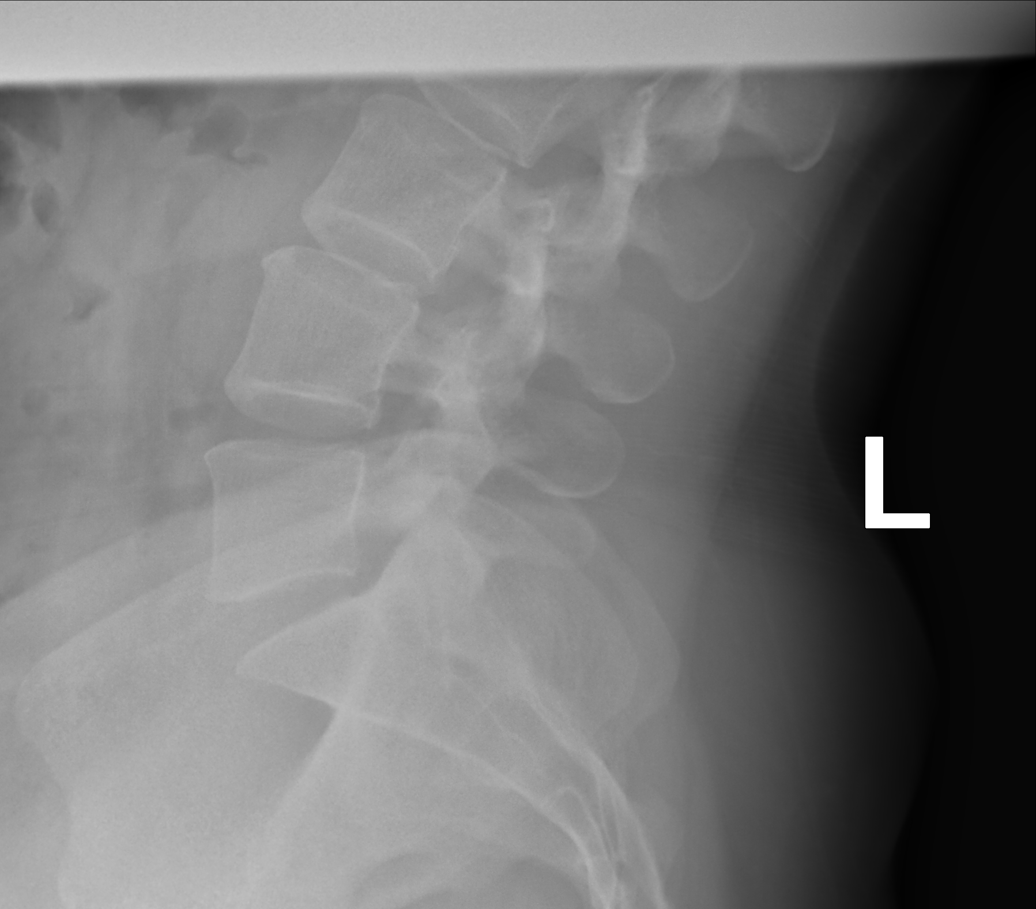

[5 of 5 positions shown; findings below may reference images not displayed]

FINDINGS: Scoliosis lumbar spine convex left. No compression fracture or pars
defect noted. Very mild L1-2 disc space narrowing. Minimal narrowing
left aspect L4-5 disc space. Bowel gas pattern unremarkable.
IMPRESSION: 1. Scoliosis lumbar spine convex left.
2. No compression fracture or pars defect noted.
3. Very mild L1-2 disc space narrowing.
4. Minimal narrowing left aspect L4-5 disc space.

## 2020-03-15 ENCOUNTER — Other Ambulatory Visit: Payer: Self-pay

## 2020-03-15 ENCOUNTER — Encounter: Payer: Self-pay | Admitting: Psychiatry

## 2020-03-15 ENCOUNTER — Ambulatory Visit (INDEPENDENT_AMBULATORY_CARE_PROVIDER_SITE_OTHER): Payer: 59 | Admitting: Psychiatry

## 2020-03-15 DIAGNOSIS — F5105 Insomnia due to other mental disorder: Secondary | ICD-10-CM | POA: Diagnosis not present

## 2020-03-15 DIAGNOSIS — F325 Major depressive disorder, single episode, in full remission: Secondary | ICD-10-CM

## 2020-03-15 DIAGNOSIS — F429 Obsessive-compulsive disorder, unspecified: Secondary | ICD-10-CM

## 2020-03-15 DIAGNOSIS — R69 Illness, unspecified: Secondary | ICD-10-CM | POA: Diagnosis not present

## 2020-03-15 MED ORDER — FLUOXETINE HCL 20 MG PO CAPS: ORAL_CAPSULE | ORAL | 1 refills | Status: DC

## 2020-03-15 NOTE — Progress Notes (Signed)
Becky Mccoy [id number] 24-Apr-1986 34 y.o.    Subjective:   Patient ID:  Becky Mccoy is a 34 y.o. (DOB 1986/07/06) female.  Chief Complaint:  Chief Complaint  Patient presents with   Follow-up   Anxiety    OCD    HPI Becky Mccoy presents  today for follow-up of exacerbation of severe OCD of the scrupulosity type.  At  visit October 10, 2018.  Patient with a history of scrupulosity which relapsed after 4 months off Prozac.  She had excessive weight gain from both sertraline and then Prozac.  The Prozac was effective at 60 mg a day.  Her symptoms were very severe and she wanted the maximal effect as soon as possible.  Therefore we increased the fluoxetine to 80 mg a day and added Abilify 5 mg to try to speed up the response rate.  It is anticipated that that will be discontinued later.    She had a very good response at her follow-up in April 2020.  She was encouraged and able to reduce the clonazepam if possible at night.  No other meds were changed at that time.  September 2020 appointment with the following noted: She was pregnant. She stopped the Abilify since last here.  She stopped the clonazepam with mild insomnia.  Sleep is fine now.   Had period of time of worsening OCD lasting 6 weeks and then resolved.  Under control now.  Still there but manageable.  Taking the fluoxetine 80 consistently.  On it 5 1/2 mos but only gained 2#.  Is pregnant.  Having a boy and 22 weeks.  [place] was done repeatedly.  Placental bleed resolved.  Growth of baby looks good.  No glucose checks yet.   [facility] January 20.   Plan continue fluoxetine 80 mg daily and follow-up after delivery.  03/15/2020 appointment with the following noted: Has not been seen since September 2020. Delivered 08/28/19 Dereck Ligas.  Doing well. No post-partum depression nor anxiety.  Glad to be back at work.  Did a lot better with this delivery. Stayed on fluoxetine 80 mg daily. Still breastfeeding. No sig  anxiety.  OCD is under control. No SE noted with Prozac except a bit tired and weight gain. Scared to reduce Prozac bc occ triggers.  Patient reports stable mood and denies depressed or irritable moods.  More myself now.  Patient denies any recent difficulty with anxiety.  Residual OCD manageable. Patient denies difficulty with sleep initiation or maintenance. Denies appetite disturbance.  Patient reports that energy and motivation have been good.  Patient denies any difficulty with concentration.  Patient denies any suicidal ideation.  Past Psychiatric Medication Trials: Fluoxetine 80 with benefit but weight gain, Trintellix, sertraline 250 caused side effects and weight gain and forgetfulness and sweating, Xanax was not effective, clonazepam was helpful for sleep and anxiety, Abilify 5 mg  Review of Systems:  Review of Systems  Constitutional: Negative for diaphoresis.  Cardiovascular: Negative for palpitations.  Neurological: Negative for tremors and weakness.  Psychiatric/Behavioral: Negative for agitation, behavioral problems, confusion, decreased concentration, dysphoric mood, hallucinations, self-injury, sleep disturbance and suicidal ideas. The patient is not nervous/anxious and is not hyperactive.     Medications: I have reviewed the patient's current medications.  Current Outpatient Medications  Medication Sig Dispense Refill   acetaminophen (TYLENOL) 325 MG tablet Take 2 tablets (650 mg total) by mouth every 4 (four) hours as needed (for pain scale < 4).     FLUoxetine (PROZAC) 20  MG capsule TAKE 4 CAPSULES BY MOUTH ONCE DAILY PT  TAKE  80  MG  ONCE  A  DAY 360 capsule 0   ibuprofen (ADVIL) 600 MG tablet Take 1 tablet (600 mg total) by mouth every 6 (six) hours as needed. 30 tablet 0   prenatal vitamin w/FE, FA (PRENATAL 1 + 1) 27-1 MG TABS tablet Take 1 tablet by mouth daily at 12 noon.     No current facility-administered medications for this visit.    Medication Side  Effects: None  Allergies:  Allergies  Allergen Reactions   Sulfa Antibiotics     Past Medical History:  Diagnosis Date   History of chicken pox    Hx: UTI (urinary tract infection)    Obsessive-compulsive and related disorder    OCD (obsessive compulsive disorder)     Family History  Problem Relation Age of Onset   Healthy Mother    Healthy Father    Cancer - Other Maternal Grandfather        stomach cancer   Cancer - Other Paternal Grandfather        Bone cancer    Social History   Socioeconomic History   Marital status: Married    Spouse name: Not on file   Number of children: Not on file   Years of education: Not on file   Highest education level: Not on file  Occupational History   Not on file  Tobacco Use   Smoking status: Never Smoker   Smokeless tobacco: Never Used  Vaping Use   Vaping Use: Never used  Substance and Sexual Activity   Alcohol use: No    Alcohol/week: 0.0 standard drinks   Drug use: No   Sexual activity: Yes    Birth control/protection: None    Comment: Vasectomy  Other Topics Concern   Not on file  Social History Narrative   Not on file   Social Determinants of Health   Financial Resource Strain:    Difficulty of Paying Living Expenses:   Food Insecurity:    Worried About Programme researcher, broadcasting/film/videounning Out of Food in the Last Year:    Baristaan Out of Food in the Last Year:   Transportation Needs:    Freight forwarderLack of Transportation (Medical):    Lack of Transportation (Non-Medical):   Physical Activity:    Days of Exercise per Week:    Minutes of Exercise per Session:   Stress:    Feeling of Stress :   Social Connections:    Frequency of Communication with Friends and Family:    Frequency of Social Gatherings with Friends and Family:    Attends Religious Services:    Active Member of Clubs or Organizations:    Attends Engineer, structuralClub or Organization Meetings:    Marital Status:   Intimate Partner Violence:    Fear of Current or  Ex-Partner:    Emotionally Abused:    Physically Abused:    Sexually Abused:     Past Medical History, Surgical history, Social history, and Family history were reviewed and updated as appropriate.   Please see review of systems for further details on the patient's review from today.   Objective:   Physical Exam:  There were no vitals taken for this visit.  Physical Exam Constitutional:      General: She is not in acute distress. Musculoskeletal:        General: No deformity.  Neurological:     Mental Status: She is alert and oriented to person, place,  and time.     Cranial Nerves: No dysarthria.     Coordination: Coordination normal.  Psychiatric:        Attention and Perception: Attention and perception normal. She does not perceive auditory or visual hallucinations.        Mood and Affect: Mood is anxious. Mood is not depressed. Affect is not labile, blunt, angry or inappropriate.        Speech: Speech normal.        Behavior: Behavior normal. Behavior is cooperative.        Thought Content: Thought content normal. Thought content is not paranoid or delusional. Thought content does not include homicidal or suicidal ideation. Thought content does not include homicidal or suicidal plan.        Cognition and Memory: Cognition and memory normal.        Judgment: Judgment normal.     Comments: Insight good.  OCD is under control with minimal anxiety.  She still has residual OCD but is managed.  Satisfied.     Lab Review:     Component Value Date/Time   NA 139 12/22/2019 1452   NA 142 03/24/2014 1617   K 4.0 12/22/2019 1452   K 4.0 03/24/2014 1617   CL 103 12/22/2019 1452   CL 105 03/24/2014 1617   CO2 27 12/22/2019 1452   CO2 26 03/24/2014 1617   GLUCOSE 78 12/22/2019 1452   GLUCOSE 93 03/24/2014 1617   BUN 19 12/22/2019 1452   BUN 13 03/24/2014 1617   CREATININE 0.64 12/22/2019 1452   CREATININE 0.62 03/24/2014 1617   CALCIUM 9.4 12/22/2019 1452   CALCIUM 9.5  03/24/2014 1617   PROT 7.2 12/22/2019 1452   ALBUMIN 4.6 12/22/2019 1452   AST 21 12/22/2019 1452   ALT 27 12/22/2019 1452   ALKPHOS 127 (H) 12/22/2019 1452   BILITOT 0.4 12/22/2019 1452   GFRNONAA >60 08/03/2016 1426   GFRNONAA >60 03/24/2014 1617   GFRAA >60 08/03/2016 1426   GFRAA >60 03/24/2014 1617       Component Value Date/Time   WBC 8.0 12/22/2019 1452   RBC 4.67 12/22/2019 1452   HGB 13.6 12/22/2019 1452   HGB 11.7 06/09/2019 1540   HCT 39.8 12/22/2019 1452   HCT 34.1 06/09/2019 1540   PLT 310.0 12/22/2019 1452   PLT 228 06/09/2019 1540   MCV 85.3 12/22/2019 1452   MCV 89 06/09/2019 1540   MCV 91 03/24/2014 1617   MCH 27.5 08/29/2019 0604   MCHC 34.1 12/22/2019 1452   RDW 14.4 12/22/2019 1452   RDW 13.0 06/09/2019 1540   RDW 12.9 03/24/2014 1617   LYMPHSABS 3.1 12/22/2019 1452   LYMPHSABS 1.3 06/09/2019 1540   LYMPHSABS 2.0 03/24/2014 1617   MONOABS 0.5 12/22/2019 1452   MONOABS 0.4 03/24/2014 1617   EOSABS 0.2 12/22/2019 1452   EOSABS 0.1 06/09/2019 1540   EOSABS 0.1 03/24/2014 1617   BASOSABS 0.1 12/22/2019 1452   BASOSABS 0.0 06/09/2019 1540   BASOSABS 0.0 03/24/2014 1617    No results found for: POCLITH, LITHIUM   No results found for: PHENYTOIN, PHENOBARB, VALPROATE, CBMZ   .res Assessment: Plan:    Obsessive-compulsive disorder, unspecified type  Major depression, single episode, in complete remission (HCC)  Insomnia due to mental condition    Patient with a history of scrupulosity which relapsed after 4 months off Prozac.  She had excessive weight gain from both sertraline and then Prozac.  The Prozac was effective at 60 mg  a day.  Her symptoms were very severe at the last visit and she wanted the maximal effect as soon as possible.  Therefore per her request we  Increased fluoxetine to 80 mg a day which she has tolerated in the past.  We  also added about Abilify 5 mg to potentiate the antidepressant speed the recovery.    Fortunately that  worked.  She was dramatically better in 5 weeks which is very unusual for OCD.   She was able to discontinue Abilify and clonazepam without unusual complications and the OCD has remained under control.  She agrees that the Prozac 80 mg is necessary and tolerated and it is effective.  Lower doses failed to work.  Therefore no med changes today We discussed pros/cons of reducing but she feels too at risk to reduce.  FU  6 mos  Meredith Staggers, MD, DFAPA  Please see After Visit Summary for patient specific instructions.  Future Appointments  Date Time Provider Department Center  03/15/2020  4:00 PM Cottle, Steva Ready., MD [facility] None  06/24/2020  3:00 PM [place], MD [id number]    No orders of the defined types were placed in this encounter.     -------------------------------

## 2020-06-02 ENCOUNTER — Other Ambulatory Visit: Payer: Self-pay | Admitting: Psychiatry

## 2020-06-02 DIAGNOSIS — F429 Obsessive-compulsive disorder, unspecified: Secondary | ICD-10-CM

## 2020-06-24 ENCOUNTER — Other Ambulatory Visit: Payer: Self-pay

## 2020-06-24 ENCOUNTER — Ambulatory Visit (INDEPENDENT_AMBULATORY_CARE_PROVIDER_SITE_OTHER): Payer: 59 | Admitting: Internal Medicine

## 2020-06-24 DIAGNOSIS — F429 Obsessive-compulsive disorder, unspecified: Secondary | ICD-10-CM | POA: Diagnosis not present

## 2020-06-24 DIAGNOSIS — E7800 Pure hypercholesterolemia, unspecified: Secondary | ICD-10-CM | POA: Diagnosis not present

## 2020-06-24 DIAGNOSIS — R69 Illness, unspecified: Secondary | ICD-10-CM | POA: Diagnosis not present

## 2020-06-24 NOTE — Progress Notes (Signed)
Patient ID: Becky Mccoy, female   DOB: [birth year], 34 y.o.   MRN: [id number]   Subjective:    Patient ID: Becky Mccoy, female    DOB: 1986-04-23, 34 y.o.   MRN: [id number]  HPI This visit occurred during the SARS-CoV-2 public health emergency.  Safety protocols were in place, including screening questions prior to the visit, additional usage of staff PPE, and extensive cleaning of exam room while observing appropriate contact time as indicated for disinfecting solutions.  Patient here for follow up appt.  She is doing well.  Seeing psychiatry for f/u OCD.  On prozac and doing well.  Feels good.  Family doing well.  Trying to stay active.  No chest pain or sob reported.  No abdominal pain or bowel change reported.  She is breast feeding.     Past Medical History:  Diagnosis Date  . History of chicken pox   . Hx: UTI (urinary tract infection)   . Obsessive-compulsive and related disorder   . OCD (obsessive compulsive disorder)    Past Surgical History:  Procedure Laterality Date  . WISDOM TOOTH EXTRACTION     Family History  Problem Relation Age of Onset  . Healthy Mother   . Healthy Father   . Cancer - Other Maternal Grandfather        stomach cancer  . Cancer - Other Paternal Grandfather        Bone cancer   Social History   Socioeconomic History  . Marital status: Married    Spouse name: Not on file  . Number of children: Not on file  . Years of education: Not on file  . Highest education level: Not on file  Occupational History  . Not on file  Tobacco Use  . Smoking status: Never Smoker  . Smokeless tobacco: Never Used  Vaping Use  . Vaping Use: Never used  Substance and Sexual Activity  . Alcohol use: No    Alcohol/week: 0.0 standard drinks  . Drug use: No  . Sexual activity: Yes    Birth control/protection: None    Comment: Vasectomy  Other Topics Concern  . Not on file  Social History Narrative  . Not on file   Social Determinants of Health    Financial Resource Strain:   . Difficulty of Paying Living Expenses: Not on file  Food Insecurity:   . Worried About Programme researcher, broadcasting/film/video in the Last Year: Not on file  . Ran Out of Food in the Last Year: Not on file  Transportation Needs:   . Lack of Transportation (Medical): Not on file  . Lack of Transportation (Non-Medical): Not on file  Physical Activity:   . Days of Exercise per Week: Not on file  . Minutes of Exercise per Session: Not on file  Stress:   . Feeling of Stress : Not on file  Social Connections:   . Frequency of Communication with Friends and Family: Not on file  . Frequency of Social Gatherings with Friends and Family: Not on file  . Attends Religious Services: Not on file  . Active Member of Clubs or Organizations: Not on file  . Attends Banker Meetings: Not on file  . Marital Status: Not on file    Outpatient Encounter Medications as of 06/24/2020  Medication Sig  . acetaminophen (TYLENOL) 325 MG tablet Take 2 tablets (650 mg total) by mouth every 4 (four) hours as needed (for pain scale < 4).  Marland Kitchen FLUoxetine (PROZAC)  20 MG capsule TAKE 4 CAPSULES BY MOUTH EVERY DAY. PATIENT TAKE 80 MG EVERY DAY  . ibuprofen (ADVIL) 600 MG tablet Take 1 tablet (600 mg total) by mouth every 6 (six) hours as needed.  . prenatal vitamin w/FE, FA (PRENATAL 1 + 1) 27-1 MG TABS tablet Take 1 tablet by mouth daily at 12 noon.   No facility-administered encounter medications on file as of 06/24/2020.    Review of Systems  Constitutional: Negative for appetite change and unexpected weight change.  HENT: Negative for congestion, sinus pressure and sore throat.   Eyes: Negative for pain and visual disturbance.  Respiratory: Negative for cough, chest tightness and shortness of breath.   Cardiovascular: Negative for chest pain, palpitations and leg swelling.  Gastrointestinal: Negative for abdominal pain, diarrhea, nausea and vomiting.  Genitourinary: Negative for  difficulty urinating and dysuria.  Musculoskeletal: Negative for joint swelling and myalgias.  Skin: Negative for color change and rash.  Neurological: Negative for dizziness, light-headedness and headaches.  Hematological: Negative for adenopathy. Does not bruise/bleed easily.  Psychiatric/Behavioral: Negative for agitation and dysphoric mood.       Objective:    Physical Exam Vitals reviewed.  Constitutional:      General: She is not in acute distress.    Appearance: Normal appearance.  HENT:     Head: Normocephalic and atraumatic.     Right Ear: External ear normal.     Left Ear: External ear normal.  Eyes:     General: No scleral icterus.       Right eye: No discharge.        Left eye: No discharge.     Conjunctiva/sclera: Conjunctivae normal.  Neck:     Thyroid: No thyromegaly.  Cardiovascular:     Rate and Rhythm: Normal rate and regular rhythm.  Pulmonary:     Effort: No respiratory distress.     Breath sounds: Normal breath sounds. No wheezing.     Comments: Breasts:  Not performed.  Abdominal:     General: Bowel sounds are normal.     Palpations: Abdomen is soft.     Tenderness: There is no abdominal tenderness.  Musculoskeletal:        General: No swelling or tenderness.     Cervical back: Neck supple.  Lymphadenopathy:     Cervical: No cervical adenopathy.  Skin:    Findings: No erythema or rash.  Neurological:     Mental Status: She is alert.  Psychiatric:        Behavior: Behavior normal.     BP 110/70   Pulse 89   Temp 98.1 F (36.7 C) (Oral)   Resp 16   Ht 5\' 6"  (1.676 m)   Wt 163 lb (73.9 kg)   SpO2 98%   BMI 26.31 kg/m  Wt Readings from Last 3 Encounters:  06/24/20 163 lb (73.9 kg)  10/09/19 161 lb (73 kg)  09/18/19 155 lb (70.3 kg)     Lab Results  Component Value Date   WBC 8.0 12/22/2019   HGB 13.6 12/22/2019   HCT 39.8 12/22/2019   PLT 310.0 12/22/2019   GLUCOSE 78 12/22/2019   CHOL 253 (H) 12/22/2019   TRIG 336.0 (H)  12/22/2019   HDL 41.80 12/22/2019   LDLDIRECT 153.0 12/22/2019   LDLCALC 152 (H) 09/19/2018   ALT 27 12/22/2019   AST 21 12/22/2019   NA 139 12/22/2019   K 4.0 12/22/2019   CL 103 12/22/2019   CREATININE 0.64 12/22/2019  BUN 19 12/22/2019   CO2 27 12/22/2019   TSH 2.24 12/22/2019       Assessment & Plan:   Problem List Items Addressed This Visit    OCD (obsessive compulsive disorder)    On prozac.  Seeing psychiatry.  Doing well.  Follow.       Hypercholesterolemia    Low cholesterol diet and exercise.  Follow lipid panel.           Dale [place], MD

## 2020-06-28 ENCOUNTER — Encounter: Payer: Self-pay | Admitting: Internal Medicine

## 2020-06-28 NOTE — Assessment & Plan Note (Signed)
Low cholesterol diet and exercise.  Follow lipid panel.   

## 2020-06-28 NOTE — Assessment & Plan Note (Signed)
On prozac.  Seeing psychiatry.  Doing well.  Follow.

## 2020-06-29 ENCOUNTER — Encounter: Payer: Self-pay | Admitting: Internal Medicine

## 2020-06-29 NOTE — Telephone Encounter (Signed)
Given related to breast feeding, I would recommend letter from pediatrician.  If a problem, let me know.

## 2020-06-30 NOTE — Telephone Encounter (Signed)
See me about this.  Please call pt and inform her to call her pediatrician again and see if they will provide a letter - since they are the child's MD.

## 2020-07-01 NOTE — Telephone Encounter (Signed)
Peds cannot do it. She has filed for religious exemption

## 2020-09-20 ENCOUNTER — Encounter: Payer: Self-pay | Admitting: Psychiatry

## 2020-09-20 ENCOUNTER — Ambulatory Visit (INDEPENDENT_AMBULATORY_CARE_PROVIDER_SITE_OTHER): Payer: 59 | Admitting: Psychiatry

## 2020-09-20 ENCOUNTER — Other Ambulatory Visit: Payer: Self-pay

## 2020-09-20 DIAGNOSIS — R69 Illness, unspecified: Secondary | ICD-10-CM | POA: Diagnosis not present

## 2020-09-20 DIAGNOSIS — F429 Obsessive-compulsive disorder, unspecified: Secondary | ICD-10-CM | POA: Diagnosis not present

## 2020-09-20 DIAGNOSIS — F5105 Insomnia due to other mental disorder: Secondary | ICD-10-CM

## 2020-09-20 DIAGNOSIS — F325 Major depressive disorder, single episode, in full remission: Secondary | ICD-10-CM

## 2020-09-20 MED ORDER — FLUOXETINE HCL 20 MG PO CAPS: ORAL_CAPSULE | ORAL | 1 refills | Status: DC

## 2020-09-20 MED ORDER — N-ACETYL-L-CYSTEINE 600 MG PO CAPS: 1200.0000 mg | ORAL_CAPSULE | Freq: Every day | ORAL | 3 refills | Status: DC

## 2020-09-20 NOTE — Progress Notes (Signed)
Becky Mccoy [id number] 05-Sep-1985 35 y.o.    Subjective:   Patient ID:  Becky Mccoy is a 35 y.o. (DOB Jan 22, 1986) female.  Chief Complaint:  Chief Complaint  Patient presents with  . Follow-up  . Obsessive-compulsive disorder, unspecified type    HPI Becky Mccoy presents  today for follow-up of exacerbation of severe OCD of the scrupulosity type.  At  visit October 10, 2018.  Patient with a history of scrupulosity which relapsed after 4 months off Prozac.  She had excessive weight gain from both sertraline and then Prozac.  The Prozac was effective at 60 mg a day.  Her symptoms were very severe and she wanted the maximal effect as soon as possible.  Therefore we increased the fluoxetine to 80 mg a day and added Abilify 5 mg to try to speed up the response rate.  It is anticipated that that will be discontinued later.    She had a very good response at her follow-up in April 2020.  She was encouraged and able to reduce the clonazepam if possible at night.  No other meds were changed at that time.  September 2020 appointment with the following noted: She was pregnant. She stopped the Abilify since last here.  She stopped the clonazepam with mild insomnia.  Sleep is fine now.   Had period of time of worsening OCD lasting 6 weeks and then resolved.  Under control now.  Still there but manageable.  Taking the fluoxetine 80 consistently.  On it 5 1/2 mos but only gained 2#.  Is pregnant.  Having a boy and 22 weeks.  [place] was done repeatedly.  Placental bleed resolved.  Growth of baby looks good.  No glucose checks yet.   [facility] January 20.   Plan continue fluoxetine 80 mg daily and follow-up after delivery.  03/15/2020 appointment with the following noted: Has not been seen since September 2020. Delivered 08/28/19 Becky Mccoy.  Doing well. No post-partum depression nor anxiety.  Glad to be back at work.  Did a lot better with this delivery. Stayed on fluoxetine 80 mg  daily. Still breastfeeding. No sig anxiety.  OCD is under control. No SE noted with Prozac except a bit tired and weight gain. Scared to reduce Prozac bc occ triggers. Plan no med changes  09/20/2020 appt with following noted: Covid free. Reduced fluoxetine to 60 mg and switched to night bc of tiredness for about 2-3 weeks.  Tiredness is a little better. Sleep 7-8 hours Dt kids.  Would like more if possible. OCD is pretty good unless triggered and then brief and gone. No other sig anxiety.  3 kids including 1 year son wide open.  Kids doing well.  7yo D caretaking. Intermittent fasting helps energy and focus.  Still breastfeeding.  Patient reports stable mood and denies depressed or irritable moods.  More myself now.   Residual OCD manageable. Patient denies difficulty with sleep initiation or maintenance. Denies appetite disturbance.  Patient reports that energy and motivation have been good.  Patient denies any difficulty with concentration.  Patient denies any suicidal ideation.  Past Psychiatric Medication Trials: Fluoxetine 80 with benefit but weight gain, Trintellix,  sertraline 250 caused side effects and weight gain and forgetfulness and sweating,  Phentermine irritable Abilify 5 mg Xanax was not effective, clonazepam was helpful for sleep and anxiety,   Review of Systems:  Review of Systems  Constitutional: Positive for unexpected weight change. Negative for diaphoresis.  Cardiovascular: Negative for palpitations.  Neurological: Negative for tremors and weakness.  Psychiatric/Behavioral: Negative for agitation, behavioral problems, confusion, decreased concentration, dysphoric mood, hallucinations, self-injury, sleep disturbance and suicidal ideas. The patient is not nervous/anxious and is not hyperactive.     Medications: I have reviewed the patient's current medications.  Current Outpatient Medications  Medication Sig Dispense Refill  . Acetylcysteine  (N-ACETYL-L-CYSTEINE) 600 MG CAPS Take 1,200 mg by mouth daily. 60 capsule 3  . acetaminophen (TYLENOL) 325 MG tablet Take 2 tablets (650 mg total) by mouth every 4 (four) hours as needed (for pain scale < 4). (Patient not taking: Reported on 09/20/2020)    . FLUoxetine (PROZAC) 20 MG capsule TAKE 4 CAPSULES BY MOUTH EVERY DAY. PATIENT TAKE 80 MG EVERY DAY 360 capsule 1  . ibuprofen (ADVIL) 600 MG tablet Take 1 tablet (600 mg total) by mouth every 6 (six) hours as needed. (Patient not taking: Reported on 09/20/2020) 30 tablet 0  . prenatal vitamin w/FE, FA (PRENATAL 1 + 1) 27-1 MG TABS tablet Take 1 tablet by mouth daily at 12 noon. (Patient not taking: Reported on 09/20/2020)     No current facility-administered medications for this visit.    Medication Side Effects: None  Allergies:  Allergies  Allergen Reactions  . Sulfa Antibiotics     Past Medical History:  Diagnosis Date  . History of chicken pox   . Hx: UTI (urinary tract infection)   . Obsessive-compulsive and related disorder   . OCD (obsessive compulsive disorder)     Family History  Problem Relation Age of Onset  . Healthy Mother   . Healthy Father   . Cancer - Other Maternal Grandfather        stomach cancer  . Cancer - Other Paternal Grandfather        Bone cancer    Social History   Socioeconomic History  . Marital status: Married    Spouse name: Not on file  . Number of children: Not on file  . Years of education: Not on file  . Highest education level: Not on file  Occupational History  . Not on file  Tobacco Use  . Smoking status: Never Smoker  . Smokeless tobacco: Never Used  Vaping Use  . Vaping Use: Never used  Substance and Sexual Activity  . Alcohol use: No    Alcohol/week: 0.0 standard drinks  . Drug use: No  . Sexual activity: Yes    Birth control/protection: None    Comment: Vasectomy  Other Topics Concern  . Not on file  Social History Narrative  . Not on file   Social Determinants of  Health   Financial Resource Strain: Not on file  Food Insecurity: Not on file  Transportation Needs: Not on file  Physical Activity: Not on file  Stress: Not on file  Social Connections: Not on file  Intimate Partner Violence: Not on file    Past Medical History, Surgical history, Social history, and Family history were reviewed and updated as appropriate.   Please see review of systems for further details on the patient's review from today.   Objective:   Physical Exam:  There were no vitals taken for this visit.  Physical Exam Constitutional:      General: She is not in acute distress. Musculoskeletal:        General: No deformity.  Neurological:     Mental Status: She is alert and oriented to person, place, and time.     Cranial Nerves: No dysarthria.  Coordination: Coordination normal.  Psychiatric:        Attention and Perception: Attention and perception normal. She does not perceive auditory or visual hallucinations.        Mood and Affect: Mood is anxious. Mood is not depressed. Affect is not labile, blunt, angry, tearful or inappropriate.        Speech: Speech normal.        Behavior: Behavior normal. Behavior is cooperative.        Thought Content: Thought content normal. Thought content is not paranoid or delusional. Thought content does not include homicidal or suicidal ideation. Thought content does not include homicidal or suicidal plan.        Cognition and Memory: Cognition and memory normal.        Judgment: Judgment normal.     Comments: Insight good.  OCD is under control with minimal anxiety.  She still has residual OCD but is managed.  Satisfied.     Lab Review:     Component Value Date/Time   NA 139 12/22/2019 1452   NA 142 03/24/2014 1617   K 4.0 12/22/2019 1452   K 4.0 03/24/2014 1617   CL 103 12/22/2019 1452   CL 105 03/24/2014 1617   CO2 27 12/22/2019 1452   CO2 26 03/24/2014 1617   GLUCOSE 78 12/22/2019 1452   GLUCOSE 93 03/24/2014  1617   BUN 19 12/22/2019 1452   BUN 13 03/24/2014 1617   CREATININE 0.64 12/22/2019 1452   CREATININE 0.62 03/24/2014 1617   CALCIUM 9.4 12/22/2019 1452   CALCIUM 9.5 03/24/2014 1617   PROT 7.2 12/22/2019 1452   ALBUMIN 4.6 12/22/2019 1452   AST 21 12/22/2019 1452   ALT 27 12/22/2019 1452   ALKPHOS 127 (H) 12/22/2019 1452   BILITOT 0.4 12/22/2019 1452   GFRNONAA >60 08/03/2016 1426   GFRNONAA >60 03/24/2014 1617   GFRAA >60 08/03/2016 1426   GFRAA >60 03/24/2014 1617       Component Value Date/Time   WBC 8.0 12/22/2019 1452   RBC 4.67 12/22/2019 1452   HGB 13.6 12/22/2019 1452   HGB 11.7 06/09/2019 1540   HCT 39.8 12/22/2019 1452   HCT 34.1 06/09/2019 1540   PLT 310.0 12/22/2019 1452   PLT 228 06/09/2019 1540   MCV 85.3 12/22/2019 1452   MCV 89 06/09/2019 1540   MCV 91 03/24/2014 1617   MCH 27.5 08/29/2019 0604   MCHC 34.1 12/22/2019 1452   RDW 14.4 12/22/2019 1452   RDW 13.0 06/09/2019 1540   RDW 12.9 03/24/2014 1617   LYMPHSABS 3.1 12/22/2019 1452   LYMPHSABS 1.3 06/09/2019 1540   LYMPHSABS 2.0 03/24/2014 1617   MONOABS 0.5 12/22/2019 1452   MONOABS 0.4 03/24/2014 1617   EOSABS 0.2 12/22/2019 1452   EOSABS 0.1 06/09/2019 1540   EOSABS 0.1 03/24/2014 1617   BASOSABS 0.1 12/22/2019 1452   BASOSABS 0.0 06/09/2019 1540   BASOSABS 0.0 03/24/2014 1617    No results found for: POCLITH, LITHIUM   No results found for: PHENYTOIN, PHENOBARB, VALPROATE, CBMZ   .res Assessment: Plan:    Obsessive-compulsive disorder, unspecified type - Plan: Acetylcysteine (N-ACETYL-L-CYSTEINE) 600 MG CAPS, FLUoxetine (PROZAC) 20 MG capsule  Major depression, single episode, in complete remission (HCC)  Insomnia due to mental condition    Patient with a history of scrupulosity which relapsed after 4 months off Prozac.  She had excessive weight gain from both sertraline and then Prozac.  The Prozac was effective at 60 mg a  day.  Her symptoms were very severe at the last visit and  she wanted the maximal effect as soon as possible.  Therefore per her request we  Increased fluoxetine to 80 mg a day which she has tolerated in the past.  We  also added about Abilify 5 mg to potentiate the antidepressant speed the recovery.    Fortunately that worked.  She was dramatically better in 5 weeks which is very unusual for OCD.   She was able to discontinue Abilify and clonazepam without unusual complications and the OCD has remained under control.  She agrees that the Prozac 60 mg is necessary and tolerated and it is effective.  Lower doses failed to work.  Therefore no med changes today We discussed pros/cons of reducing but she feels too at risk to reduce.  Fluoxetine has unfortunately caused weight gain as did sertraline before it.  There is no other reasonable treatment of OCD that would be any less likely to cause weight gain.  She is tried phentermine but it caused irritability.  She is recently reduced fluoxetine to 60 mg and she will not reduce it further because of high risk of relapse. Discussed Ozempic and weight loss and gave her a copy of the JAMA article and suggest she discuss this with her primary care doctor to see if it is an option.  FU  6 mos  Meredith Staggers, MD, DFAPA  Please see After Visit Summary for patient specific instructions.  Future Appointments  Date Time Provider Department Center  10/11/2020  8:15 AM [facility]  10/12/2020 11:30 AM Dale [place], MD [facility]    No orders of the defined types were placed in this encounter.     -------------------------------

## 2020-09-28 DIAGNOSIS — Z20822 Contact with and (suspected) exposure to covid-19: Secondary | ICD-10-CM | POA: Diagnosis not present

## 2020-10-08 ENCOUNTER — Telehealth: Payer: Self-pay | Admitting: *Deleted

## 2020-10-08 DIAGNOSIS — E7800 Pure hypercholesterolemia, unspecified: Secondary | ICD-10-CM

## 2020-10-08 DIAGNOSIS — E0789 Other specified disorders of thyroid: Secondary | ICD-10-CM

## 2020-10-08 NOTE — Telephone Encounter (Signed)
Please place future orders for lab appt.  

## 2020-10-09 NOTE — Telephone Encounter (Signed)
Order placed for labs.

## 2020-10-11 ENCOUNTER — Other Ambulatory Visit (INDEPENDENT_AMBULATORY_CARE_PROVIDER_SITE_OTHER): Payer: 59

## 2020-10-11 ENCOUNTER — Other Ambulatory Visit: Payer: Self-pay

## 2020-10-11 DIAGNOSIS — E0789 Other specified disorders of thyroid: Secondary | ICD-10-CM

## 2020-10-11 DIAGNOSIS — E7800 Pure hypercholesterolemia, unspecified: Secondary | ICD-10-CM

## 2020-10-11 LAB — TSH: TSH: 1.52 u[IU]/mL (ref 0.35–4.50)

## 2020-10-11 LAB — LDL CHOLESTEROL, DIRECT: Direct LDL: 135 mg/dL

## 2020-10-11 LAB — CBC WITH DIFFERENTIAL/PLATELET
Basophils Absolute: 0 10*3/uL (ref 0.0–0.1)
Basophils Relative: 0.4 % (ref 0.0–3.0)
Eosinophils Absolute: 0.3 10*3/uL (ref 0.0–0.7)
Eosinophils Relative: 3 % (ref 0.0–5.0)
HCT: 41.1 % (ref 36.0–46.0)
Hemoglobin: 13.9 g/dL (ref 12.0–15.0)
Lymphocytes Relative: 30.1 % (ref 12.0–46.0)
Lymphs Abs: 2.6 10*3/uL (ref 0.7–4.0)
MCHC: 33.9 g/dL (ref 30.0–36.0)
MCV: 87.4 fl (ref 78.0–100.0)
Monocytes Absolute: 0.5 10*3/uL (ref 0.1–1.0)
Monocytes Relative: 5.7 % (ref 3.0–12.0)
Neutro Abs: 5.3 10*3/uL (ref 1.4–7.7)
Neutrophils Relative %: 60.8 % (ref 43.0–77.0)
Platelets: 286 10*3/uL (ref 150.0–400.0)
RBC: 4.7 Mil/uL (ref 3.87–5.11)
RDW: 12.7 % (ref 11.5–15.5)
WBC: 8.7 10*3/uL (ref 4.0–10.5)

## 2020-10-11 LAB — COMPREHENSIVE METABOLIC PANEL
ALT: 14 U/L (ref 0–35)
AST: 13 U/L (ref 0–37)
Albumin: 4.2 g/dL (ref 3.5–5.2)
Alkaline Phosphatase: 78 U/L (ref 39–117)
BUN: 19 mg/dL (ref 6–23)
CO2: 26 mEq/L (ref 19–32)
Calcium: 9.1 mg/dL (ref 8.4–10.5)
Chloride: 104 mEq/L (ref 96–112)
Creatinine, Ser: 0.58 mg/dL (ref 0.40–1.20)
GFR: 118.55 mL/min (ref >60.00)
Glucose, Bld: 91 mg/dL (ref 70–99)
Potassium: 4.3 mEq/L (ref 3.5–5.1)
Sodium: 138 mEq/L (ref 135–145)
Total Bilirubin: 0.4 mg/dL (ref 0.2–1.2)
Total Protein: 7 g/dL (ref 6.0–8.3)

## 2020-10-11 LAB — LIPID PANEL
Cholesterol: 227 mg/dL — ABNORMAL HIGH (ref 0–200)
HDL: 40.4 mg/dL (ref >39.00)
NonHDL: 186.94
Total CHOL/HDL Ratio: 6
Triglycerides: 298 mg/dL — ABNORMAL HIGH (ref 0.0–149.0)
VLDL: 59.6 mg/dL — ABNORMAL HIGH (ref 0.0–40.0)

## 2020-10-12 ENCOUNTER — Other Ambulatory Visit: Payer: Self-pay

## 2020-10-12 ENCOUNTER — Other Ambulatory Visit (HOSPITAL_COMMUNITY)
Admission: RE | Admit: 2020-10-12 | Discharge: 2020-10-12 | Disposition: A | Discharge status: Home / Self Care | Payer: 59 | Source: Ambulatory Visit | Attending: Internal Medicine | Admitting: Internal Medicine

## 2020-10-12 ENCOUNTER — Ambulatory Visit (INDEPENDENT_AMBULATORY_CARE_PROVIDER_SITE_OTHER): Payer: 59 | Admitting: Internal Medicine

## 2020-10-12 ENCOUNTER — Encounter: Payer: Self-pay | Admitting: Internal Medicine

## 2020-10-12 VITALS — BP 118/64 | HR 97 | Temp 98.1°F | Resp 16 | Ht 66.0 in | Wt 165.6 lb

## 2020-10-12 DIAGNOSIS — N898 Other specified noninflammatory disorders of vagina: Secondary | ICD-10-CM

## 2020-10-12 DIAGNOSIS — F429 Obsessive-compulsive disorder, unspecified: Secondary | ICD-10-CM

## 2020-10-12 DIAGNOSIS — Z124 Encounter for screening for malignant neoplasm of cervix: Secondary | ICD-10-CM | POA: Insufficient documentation

## 2020-10-12 DIAGNOSIS — Z0000 Encounter for general adult medical examination without abnormal findings: Secondary | ICD-10-CM

## 2020-10-12 DIAGNOSIS — L989 Disorder of the skin and subcutaneous tissue, unspecified: Secondary | ICD-10-CM

## 2020-10-12 DIAGNOSIS — E7800 Pure hypercholesterolemia, unspecified: Secondary | ICD-10-CM | POA: Diagnosis not present

## 2020-10-12 DIAGNOSIS — R69 Illness, unspecified: Secondary | ICD-10-CM | POA: Diagnosis not present

## 2020-10-12 NOTE — Assessment & Plan Note (Signed)
Physical today 10/12/20.  PAP 10/12/20.

## 2020-10-12 NOTE — Progress Notes (Addendum)
Patient ID: Becky Mccoy, female   DOB: 1986-07-17, 35 y.o.   MRN: [id number]   Subjective:    Patient ID: Becky Mccoy, female    DOB: 10-25-1985, 35 y.o.   MRN: [id number]  HPI This visit occurred during the SARS-CoV-2 public health emergency.  Safety protocols were in place, including screening questions prior to the visit, additional usage of staff PPE, and extensive cleaning of exam room while observing appropriate contact time as indicated for disinfecting solutions.  Patient here for her physical exam.  Is breast feeding.  Concerned about weight gain.  Discussed diet and exercise.  Discussed treatment options including [facility] and medications.  She had questions about wegovy, etc.  No chest pain or sob reported. No abdominal pain or cramping reported.  LMP 1-2 weeks ago - spotting.  Does report vaginal discharge - persistent intermittent issue.  Notices more before period.  Persistent right leg lesion.     Past Medical History:  Diagnosis Date  . History of chicken pox   . Hx: UTI (urinary tract infection)   . Obsessive-compulsive and related disorder   . OCD (obsessive compulsive disorder)    Past Surgical History:  Procedure Laterality Date  . WISDOM TOOTH EXTRACTION     Family History  Problem Relation Age of Onset  . Healthy Mother   . Healthy Father   . Cancer - Other Maternal Grandfather        stomach cancer  . Cancer - Other Paternal Grandfather        Bone cancer   Social History   Socioeconomic History  . Marital status: Married    Spouse name: Not on file  . Number of children: Not on file  . Years of education: Not on file  . Highest education level: Not on file  Occupational History  . Not on file  Tobacco Use  . Smoking status: Never Smoker  . Smokeless tobacco: Never Used  Vaping Use  . Vaping Use: Never used  Substance and Sexual Activity  . Alcohol use: No    Alcohol/week: 0.0 standard drinks  . Drug use: No  . Sexual  activity: Yes    Birth control/protection: None    Comment: Vasectomy  Other Topics Concern  . Not on file  Social History Narrative  . Not on file   Social Determinants of Health   Financial Resource Strain: Not on file  Food Insecurity: Not on file  Transportation Needs: Not on file  Physical Activity: Not on file  Stress: Not on file  Social Connections: Not on file    Outpatient Encounter Medications as of 10/12/2020  Medication Sig  . acetaminophen (TYLENOL) 325 MG tablet Take 2 tablets (650 mg total) by mouth every 4 (four) hours as needed (for pain scale < 4). (Patient not taking: Reported on 09/20/2020)  . Acetylcysteine (N-ACETYL-L-CYSTEINE) 600 MG CAPS Take 1,200 mg by mouth daily.  Marland Kitchen FLUoxetine (PROZAC) 20 MG capsule TAKE 4 CAPSULES BY MOUTH EVERY DAY. PATIENT TAKE 80 MG EVERY DAY  . [DISCONTINUED] ibuprofen (ADVIL) 600 MG tablet Take 1 tablet (600 mg total) by mouth every 6 (six) hours as needed. (Patient not taking: Reported on 09/20/2020)  . [DISCONTINUED] prenatal vitamin w/FE, FA (PRENATAL 1 + 1) 27-1 MG TABS tablet Take 1 tablet by mouth daily at 12 noon. (Patient not taking: Reported on 09/20/2020)   No facility-administered encounter medications on file as of 10/12/2020.    Review of Systems  Constitutional:  Negative for appetite change.       Weight gain.    HENT: Negative for congestion and sinus pressure.   Eyes: Negative for pain and visual disturbance.  Respiratory: Negative for cough, chest tightness and shortness of breath.   Cardiovascular: Negative for chest pain, palpitations and leg swelling.  Gastrointestinal: Negative for abdominal pain, diarrhea, nausea and vomiting.  Genitourinary: Negative for difficulty urinating and dysuria.  Musculoskeletal: Negative for joint swelling and myalgias.  Skin: Negative for color change and rash.  Neurological: Negative for dizziness, light-headedness and headaches.  Hematological: Negative for adenopathy. Does not  bruise/bleed easily.  Psychiatric/Behavioral: Negative for agitation and dysphoric mood.       Objective:    Physical Exam Vitals reviewed.  Constitutional:      General: She is not in acute distress.    Appearance: Normal appearance. She is well-developed and well-nourished.  HENT:     Head: Normocephalic and atraumatic.     Right Ear: External ear normal.     Left Ear: External ear normal.     Mouth/Throat:     Mouth: Oropharynx is clear and moist.  Eyes:     General: No scleral icterus.       Right eye: No discharge.        Left eye: No discharge.     Conjunctiva/sclera: Conjunctivae normal.  Neck:     Thyroid: No thyromegaly.  Cardiovascular:     Rate and Rhythm: Normal rate and regular rhythm.  Pulmonary:     Effort: No tachypnea, accessory muscle usage or respiratory distress.     Breath sounds: Normal breath sounds. No decreased breath sounds or wheezing.  Chest:  Breasts:     Right: No inverted nipple, mass, nipple discharge or tenderness (no axillary adenopathy).     Left: No inverted nipple, mass, nipple discharge or tenderness (no axilarry adenopathy).    Abdominal:     General: Bowel sounds are normal.     Palpations: Abdomen is soft.     Tenderness: There is no abdominal tenderness.  Genitourinary:    Comments: Normal external genitalia.  Vaginal vault without lesions.  Cervix identified.  Pap smear performed.  Could not appreciate any adnexal masses or tenderness. Vaginal discharge present - KOH/wet prep sent.    Musculoskeletal:        General: No swelling, tenderness or edema.     Cervical back: Neck supple. No tenderness.  Lymphadenopathy:     Cervical: No cervical adenopathy.  Skin:    Findings: No erythema or rash.  Neurological:     Mental Status: She is alert and oriented to person, place, and time.  Psychiatric:        Mood and Affect: Mood and affect and mood normal.        Behavior: Behavior normal.     BP 118/64   Pulse 97   Temp  98.1 F (36.7 C) (Oral)   Resp 16   Ht 5\' 6"  (1.676 m)   Wt 165 lb 9.6 oz (75.1 kg)   SpO2 98%   BMI 26.73 kg/m  Wt Readings from Last 3 Encounters:  10/12/20 165 lb 9.6 oz (75.1 kg)  06/24/20 163 lb (73.9 kg)  10/09/19 161 lb (73 kg)     Lab Results  Component Value Date   WBC 8.7 10/11/2020   HGB 13.9 10/11/2020   HCT 41.1 10/11/2020   PLT 286.0 10/11/2020   GLUCOSE 91 10/11/2020   CHOL 227 (H) 10/11/2020  TRIG 298.0 (H) 10/11/2020   HDL 40.40 10/11/2020   LDLDIRECT 135.0 10/11/2020   LDLCALC 152 (H) 09/19/2018   ALT 14 10/11/2020   AST 13 10/11/2020   NA 138 10/11/2020   K 4.3 10/11/2020   CL 104 10/11/2020   CREATININE 0.58 10/11/2020   BUN 19 10/11/2020   CO2 26 10/11/2020   TSH 1.52 10/11/2020       Assessment & Plan:   Problem List Items Addressed This Visit    Health care maintenance    Physical today 10/12/20.  PAP 10/12/20.        Hypercholesterolemia    Low cholesterol diet and exercise.  Follow lipid panel.        Leg skin lesion, right    Persistent.  Request referral to dermatology.        Relevant Orders   Ambulatory referral to Dermatology   OCD (obsessive compulsive disorder)    On prozac. Seeing psychiatry.  Stable.        Vaginal discharge    Vaginal discharge.  KOH/wet prep sent. PAP performed.  Follow.         Other Visit Diagnoses    Routine general medical examination at a health care facility    -  Primary   Cervical cancer screening       Relevant Orders   Cytology - PAP( [facility]) (Completed)   Cervicovaginal ancillary only( [facility]) (Completed)       Dale Upson, MD

## 2020-10-13 ENCOUNTER — Encounter: Payer: Self-pay | Admitting: Internal Medicine

## 2020-10-13 LAB — CERVICOVAGINAL ANCILLARY ONLY
Bacterial Vaginitis (gardnerella): NEGATIVE
Candida Glabrata: NEGATIVE
Candida Vaginitis: NEGATIVE
Comment: NEGATIVE
Comment: NEGATIVE
Comment: NEGATIVE

## 2020-10-13 LAB — CYTOLOGY - PAP
Comment: NEGATIVE
Diagnosis: NEGATIVE
High risk HPV: NEGATIVE

## 2020-10-14 DIAGNOSIS — H5213 Myopia, bilateral: Secondary | ICD-10-CM | POA: Diagnosis not present

## 2020-10-17 ENCOUNTER — Encounter: Payer: Self-pay | Admitting: Internal Medicine

## 2020-10-17 DIAGNOSIS — L989 Disorder of the skin and subcutaneous tissue, unspecified: Secondary | ICD-10-CM | POA: Insufficient documentation

## 2020-10-17 DIAGNOSIS — N898 Other specified noninflammatory disorders of vagina: Secondary | ICD-10-CM | POA: Insufficient documentation

## 2020-10-17 NOTE — Assessment & Plan Note (Signed)
Persistent.  Request referral to dermatology.  

## 2020-10-17 NOTE — Assessment & Plan Note (Signed)
Vaginal discharge.  KOH/wet prep sent. PAP performed.  Follow.

## 2020-10-17 NOTE — Addendum Note (Signed)
Addended by: Charm Barges on: 10/17/2020 02:25 PM   Modules accepted: Orders

## 2020-10-17 NOTE — Assessment & Plan Note (Signed)
On prozac. Seeing psychiatry.  Stable.

## 2020-10-17 NOTE — Assessment & Plan Note (Signed)
Low cholesterol diet and exercise.  Follow lipid panel.   

## 2020-10-25 NOTE — Telephone Encounter (Signed)
Confirm symptoms and needs appt.

## 2020-10-25 NOTE — Telephone Encounter (Signed)
Pt scheduled for virtual.  ?

## 2020-10-26 ENCOUNTER — Telehealth (INDEPENDENT_AMBULATORY_CARE_PROVIDER_SITE_OTHER): Payer: 59 | Admitting: Internal Medicine

## 2020-10-26 ENCOUNTER — Other Ambulatory Visit: Payer: 59

## 2020-10-26 ENCOUNTER — Other Ambulatory Visit: Payer: Self-pay

## 2020-10-26 DIAGNOSIS — R69 Illness, unspecified: Secondary | ICD-10-CM | POA: Diagnosis not present

## 2020-10-26 DIAGNOSIS — F429 Obsessive-compulsive disorder, unspecified: Secondary | ICD-10-CM | POA: Diagnosis not present

## 2020-10-26 DIAGNOSIS — R059 Cough, unspecified: Secondary | ICD-10-CM | POA: Insufficient documentation

## 2020-10-26 MED ORDER — AMOXICILLIN 875 MG PO TABS: 875.0000 mg | ORAL_TABLET | Freq: Two times a day (BID) | ORAL | 0 refills | Status: DC

## 2020-10-26 NOTE — Progress Notes (Signed)
Patient ID: Becky Mccoy, female   DOB: 25-Nov-1985, 35 y.o.   MRN: [id number]   Virtual Visit via video Note  This visit type was conducted due to national recommendations for restrictions regarding the COVID-19 pandemic (e.g. social distancing).  This format is felt to be most appropriate for this patient at this time.  All issues noted in this document were discussed and addressed.  No physical exam was performed (except for noted visual exam findings with Video Visits).   I connected with Becky Mccoy by a video enabled telemedicine application or telephone and verified that I am speaking with the correct person using two identifiers. Location patient: home Location provider: work  Persons participating in the virtual visit: patient, provider  The limitations, risks, security and privacy concerns of performing an evaluation and management service by video and the availability of in person appointments have been discussed.  It has also been discussed with the patient that there may be a patient responsible charge related to this service. The patient expressed understanding and agreed to proceed.   Reason for visit: work in appt  HPI: Work in for increased sinus congestion and sinus pressure.  States symptoms started Friday 10/22/20.  Had low grade temp - 99.  Increased nasal congestion and sore throat.  Increased cough - yellow mucus production.  If she yawns - ears pop.  No vomiting or diarrhea.  No chest tightness or sob.  Started claritin - took last pm.  Also taking tylenol and aleve.  Son has been sick.  His symptoms started last week.  Was diagnosed with croup. Denies any known covid exposure.    ROS: See pertinent positives and negatives per HPI.  Past Medical History:  Diagnosis Date  . History of chicken pox   . Hx: UTI (urinary tract infection)   . Obsessive-compulsive and related disorder   . OCD (obsessive compulsive disorder)     Past Surgical History:  Procedure  Laterality Date  . WISDOM TOOTH EXTRACTION      Family History  Problem Relation Age of Onset  . Healthy Mother   . Healthy Father   . Cancer - Other Maternal Grandfather        stomach cancer  . Cancer - Other Paternal Grandfather        Bone cancer    SOCIAL HX: reviewed.    Current Outpatient Medications:  .  amoxicillin (AMOXIL) 875 MG tablet, Take 1 tablet (875 mg total) by mouth 2 (two) times daily., Disp: 20 tablet, Rfl: 0 .  acetaminophen (TYLENOL) 325 MG tablet, Take 2 tablets (650 mg total) by mouth every 4 (four) hours as needed (for pain scale < 4). (Patient not taking: Reported on 09/20/2020), Disp:  , Rfl:  .  Acetylcysteine (N-ACETYL-L-CYSTEINE) 600 MG CAPS, Take 1,200 mg by mouth daily., Disp: 60 capsule, Rfl: 3 .  FLUoxetine (PROZAC) 20 MG capsule, TAKE 4 CAPSULES BY MOUTH EVERY DAY. PATIENT TAKE 80 MG EVERY DAY, Disp: 360 capsule, Rfl: 1  EXAM:  GENERAL: alert, oriented, appears well and in no acute distress  HEENT: atraumatic, conjunttiva clear, no obvious abnormalities on inspection of external nose and ears  NECK: normal movements of the head and neck  LUNGS: on inspection no signs of respiratory distress, breathing rate appears normal, no obvious gross SOB, gasping or wheezing  CV: no obvious cyanosis  PSYCH/NEURO: pleasant and cooperative, no obvious depression or anxiety, speech and thought processing grossly intact  ASSESSMENT AND PLAN:  Discussed the  following assessment and plan:  Problem List Items Addressed This Visit    Breast feeding status of mother    Continues to breast feed.  Follow.        Cough    Increased cough and congestion as outlined.  Previous increased sinus pressure initially and now with cough and congestion - productive of yellow mucus.  Concern regarding sinus infection/URI.  Saline flushes as directed.  Amoxicillin as directed.  Can continue tylenol if needed.  Discussed possible covid.  She is agreeable to test.   Discussed quarantine guidelines.  Call with update.        Relevant Orders   Novel Coronavirus, NAA ([facility]) (Completed)   OCD (obsessive compulsive disorder)    Has been stable on prozac.           I discussed the assessment and treatment plan with the patient. The patient was provided an opportunity to ask questions and all were answered. The patient agreed with the plan and demonstrated an understanding of the instructions.   The patient was advised to call back or seek an in-person evaluation if the symptoms worsen or if the condition fails to improve as anticipated.    Dale Meraux, MD

## 2020-10-27 ENCOUNTER — Encounter: Payer: Self-pay | Admitting: Internal Medicine

## 2020-10-27 LAB — SARS-COV-2, NAA 2 DAY TAT

## 2020-10-27 LAB — NOVEL CORONAVIRUS, NAA: SARS-CoV-2, NAA: NOT DETECTED

## 2020-10-27 NOTE — Assessment & Plan Note (Signed)
Has been stable on prozac.

## 2020-10-27 NOTE — Assessment & Plan Note (Signed)
Increased cough and congestion as outlined.  Previous increased sinus pressure initially and now with cough and congestion - productive of yellow mucus.  Concern regarding sinus infection/URI.  Saline flushes as directed.  Amoxicillin as directed.  Can continue tylenol if needed.  Discussed possible covid.  She is agreeable to test.  Discussed quarantine guidelines.  Call with update.

## 2020-10-27 NOTE — Assessment & Plan Note (Signed)
Continues to breast feed.  Follow.

## 2021-01-12 ENCOUNTER — Encounter: Payer: Self-pay | Admitting: Internal Medicine

## 2021-01-12 NOTE — Telephone Encounter (Signed)
I do not mind seeing him.  Just wanted you to be aware.  Thanks

## 2021-01-26 ENCOUNTER — Encounter: Payer: Self-pay | Admitting: Internal Medicine

## 2021-01-26 NOTE — Telephone Encounter (Signed)
Can we work her in at 7:00 or 4:30 tomorrow for a virtual?

## 2021-01-26 NOTE — Telephone Encounter (Signed)
Please confirm with Becky Mccoy if she sees psychiatry - prior to her appt.

## 2021-01-27 ENCOUNTER — Telehealth (INDEPENDENT_AMBULATORY_CARE_PROVIDER_SITE_OTHER): Payer: Managed Care, Other (non HMO) | Admitting: Internal Medicine

## 2021-01-27 ENCOUNTER — Telehealth: Payer: Self-pay

## 2021-01-27 DIAGNOSIS — F429 Obsessive-compulsive disorder, unspecified: Secondary | ICD-10-CM

## 2021-01-27 NOTE — Telephone Encounter (Signed)
Pt called after hours at 7:30 this morning returning your call

## 2021-01-27 NOTE — Telephone Encounter (Signed)
Will see at 4:30 today and discuss.

## 2021-01-27 NOTE — Telephone Encounter (Signed)
Pt seeing Dr Lorin Picket today

## 2021-01-27 NOTE — Progress Notes (Signed)
Patient ID: Becky Mccoy, female   DOB: [birth year], 35 y.o.   MRN: [id number]   Virtual Visit via video Note  This visit type was conducted due to national recommendations for restrictions regarding the COVID-19 pandemic (e.g. social distancing).  This format is felt to be most appropriate for this patient at this time.  All issues noted in this document were discussed and addressed.  No physical exam was performed (except for noted visual exam findings with Video Visits).   I connected with [name] by a video enabled telemedicine application and verified that I am speaking with the correct person using two identifiers. Location patient: home Location provider: work  Persons participating in the virtual visit: patient, provider  The limitations, risks, security and privacy concerns of performing an evaluation and management service by video and the availability of in person appointments have been discussed.  It has also been discussed with the patient that there may be a patient responsible charge related to this service. The patient expressed understanding and agreed to proceed.   Reason for visit: work in appt  HPI: Work in to discuss medication changes.  Has been on prozac for treatment of OCD.  Was on 80mg .  Decreased to 60mg  for 3-4 months and about 3-4 weeks ago, she decreased herself to 40mg  of prozac daily.  States on the higher doses of prozac, she wanted to sleep all of the time.  Felt it helped her OCD, but did not like the sedating effect.  Since being on the lower dose, she describes being more irritable.  Gets upset easier.  More emotional. Increased stress.  Increased stress a work - worrying if she has said or done something wrong.  Son is in counseling - found whiskey in his book bag.  Minimal depression.  Some increased anxiety.  Has been on zoloft.  Apparently on high dose of zoloft (reports 200mg  dose) and states felt foggy.  Couldn't think.  Briefly on lexapro.  Did  not feel helped, but reports only on for a short time.  Has tried abilify previously.  Clonazepam helps, but she has been trying not to take this medication.  Previously seeing psychiatry in [place].  Desires not to return.  Discussed psych referral and counseling.     ROS: See pertinent positives and negatives per HPI.  Past Medical History:  Diagnosis Date   History of chicken pox    Hx: UTI (urinary tract infection)    Obsessive-compulsive and related disorder    OCD (obsessive compulsive disorder)     Past Surgical History:  Procedure Laterality Date   WISDOM TOOTH EXTRACTION      Family History  Problem Relation Age of Onset   Healthy Mother    Healthy Father    Cancer - Other Maternal Grandfather        stomach cancer   Cancer - Other Paternal Grandfather        Bone cancer    SOCIAL HX: reviewed.    Current Outpatient Medications:    acetaminophen (TYLENOL) 325 MG tablet, Take 2 tablets (650 mg total) by mouth every 4 (four) hours as needed (for pain scale < 4)., Disp:  , Rfl:    FLUoxetine (PROZAC) 20 MG capsule, TAKE 4 CAPSULES BY MOUTH EVERY DAY. PATIENT TAKE 80 MG EVERY DAY, Disp: 360 capsule, Rfl: 1  EXAM:  GENERAL: alert, oriented, appears well and in no acute distress  HEENT: atraumatic, conjunttiva clear, no obvious abnormalities on inspection of external  nose and ears  NECK: normal movements of the head and neck  LUNGS: on inspection no signs of respiratory distress, breathing rate appears normal, no obvious gross SOB, gasping or wheezing  CV: no obvious cyanosis  PSYCH/NEURO: pleasant and cooperative, no obvious depression or anxiety, speech and thought processing grossly intact  ASSESSMENT AND PLAN:  Discussed the following assessment and plan:  Problem List Items Addressed This Visit     Breast feeding status of mother    Continues to breast feed.  Discussed medications.        OCD (obsessive compulsive disorder)    Has previously seen  psychiatry in [place].  Was on 80mg  of prozac.  Recently decreased her self from 60mg  to 40mg  daily - due to sedating effect.  Since being on the lower dose, she is more emotional and more irritable.  Minimal depression.  Increased stress and anxiety as outlined.  Has been on increased dose of zoloft previously - felt foggy and could not think as clear.  Briefly on lexapro.  Previously treated with abilify.  Discussed referral to local psychiatrist.  Also discussed counseling.  She is interested in counseling.  D/w psychiatry - help with medication choices and adjustment.         Return if symptoms worsen or fail to improve.   I discussed the assessment and treatment plan with the patient. The patient was provided an opportunity to ask questions and all were answered. The patient agreed with the plan and demonstrated an understanding of the instructions.   The patient was advised to call back or seek an in-person evaluation if the symptoms worsen or if the condition fails to improve as anticipated.    , MD

## 2021-01-27 NOTE — Telephone Encounter (Signed)
LMTCB

## 2021-01-27 NOTE — Telephone Encounter (Signed)
Pt called the office at 7:30am to speak with [place]. Pt states she will have her phone on her all day.

## 2021-01-27 NOTE — Telephone Encounter (Signed)
Patient saw psychiatry in February. Does not plan to see them anymore. Advised that we can see her today but she will need to establish with another psychiatrist if she does not wish to see them anymore. States that she mentioned you taking over her meds previously. She is agreeable to do 8:30 or 4:30 today.

## 2021-01-28 ENCOUNTER — Encounter: Payer: Self-pay | Admitting: Internal Medicine

## 2021-01-28 NOTE — Assessment & Plan Note (Signed)
Has previously seen psychiatry in [place].  Was on 80mg  of prozac.  Recently decreased her self from 60mg  to 40mg  daily - due to sedating effect.  Since being on the lower dose, she is more emotional and more irritable.  Minimal depression.  Increased stress and anxiety as outlined.  Has been on increased dose of zoloft previously - felt foggy and could not think as clear.  Briefly on lexapro.  Previously treated with abilify.  Discussed referral to local psychiatrist.  Also discussed counseling.  She is interested in counseling.  D/w psychiatry - help with medication choices and adjustment.

## 2021-01-28 NOTE — Assessment & Plan Note (Signed)
Continues to breast feed.  Discussed medications.

## 2021-02-01 ENCOUNTER — Encounter: Payer: Self-pay | Admitting: Internal Medicine

## 2021-02-02 ENCOUNTER — Telehealth: Payer: Self-pay | Admitting: Psychiatry

## 2021-02-02 NOTE — Telephone Encounter (Signed)
Pt called reporting she has anxiety. Has a lot of stress in life at this time. She has decreased Prozac from 60 mg to 40 mg about 4 weeks ago. Prozac seems to be working. Just requesting something for anxiety. She is very irritable and mean. She is breast feeding 2/d for about 5 mins each. 1 in AM and 1 in PM, weaning him off. If she needs to stop completely she will. She is on vacation. Pharmacy for this time only is [facility] [address]  [place] [address] Phone # [phone].

## 2021-02-03 NOTE — Telephone Encounter (Signed)
Do you need anymore information from her?

## 2021-02-04 ENCOUNTER — Telehealth: Payer: Self-pay | Admitting: Psychiatry

## 2021-02-04 NOTE — Telephone Encounter (Signed)
Please review previous message sent for her

## 2021-02-04 NOTE — Telephone Encounter (Signed)
Becky Mccoy called following up on the 02/02/21 message. She is still on vacation and has not heard anything back. Her phone number is [phone]/

## 2021-02-07 NOTE — Telephone Encounter (Signed)
I will not agree to prescribe clonazepam unless she agrees to stop breast-feeding.  And by the way 4 mg of Klonopin is a very high dosage.  If she needs that high dose that she needs to get in to see me ASAP.  If she agrees to stop breast-feeding I will give her clonazepam 0.5 mg 1/2 to 1 tablet 3 times daily as needed anxiety.

## 2021-02-07 NOTE — Telephone Encounter (Signed)
Pt called back and she doesn't want to take klonopin unless she had to anyway. She was asking about a medication she took about 1-1 1/2 years ago that helped, she took temporarily. I looked back and it looks like it was Abilify 5 mg, she said that was it. Asking if that is something she could add to help her? Informed her I would check with Dr. Jennelle Human and follow up tomorrow.

## 2021-02-07 NOTE — Telephone Encounter (Signed)
Why did she reduce the fluoxetine?Marland Kitchen  At the last appointment she was taking 60 mg in February and was doing well.  Reducing the fluoxetine could be the cause of her anxiety and irritability.  It seems to me that she should go back up in the fluoxetine dose to 60 mg daily.  It is true that that may take a week or so to work for the irritability and up to a few weeks to work for the anxiety.  Previously she is taking clonazepam 0.5 mg.  I can send in a prescription for that twice a day as needed for anxiety but she would have to completely stop breast-feeding in order to 2 take the clonazepam.  Verify that she is willing to do this and ask about the reasons for the fluoxetine reduction.

## 2021-02-07 NOTE — Telephone Encounter (Signed)
Rtc to pt and she reports she reduced the Fluoxetine to 40 mg because of her side effects, she reports she was tired all the time. She also contacted her pediatrician because she had some old Clonazepam, the pediatrician told her it was safe to take just not to exceed 4 mg daily. She took 1/2 tablet of 0.5 mg and it took the edge off. She has only had to take it a couple of times.   She asked if a new Rx can be sent to her [facility] in [place] they are back from vacation Clonazepam 0.5 mg.

## 2021-02-07 NOTE — Telephone Encounter (Signed)
Left pt detailed message with information and to call back with further questions

## 2021-02-07 NOTE — Telephone Encounter (Signed)
Pt is NOT requesting that much clonazepam. Her pediatrician told her it was safe for the baby UP TO 4 mg. She only took 0.25 mg twice.  I believe this message was misunderstood.

## 2021-02-08 NOTE — Telephone Encounter (Signed)
Put her on cancellation list. °

## 2021-02-08 NOTE — Telephone Encounter (Signed)
Let her know I cannot address her concerns without an appointment.  I am willing to work her into an appointment on Friday at 11:30 AM to address her concerns.  Admin staff have been notified

## 2021-02-09 NOTE — Telephone Encounter (Signed)
I called pt and she works  Friday, sat. And Sunday. So she can't come in on Friday. What do you want to do? Do you want to woirk her in at another time. She is off Mon- Thursday.

## 2021-02-21 ENCOUNTER — Other Ambulatory Visit: Payer: Self-pay

## 2021-02-21 ENCOUNTER — Ambulatory Visit (INDEPENDENT_AMBULATORY_CARE_PROVIDER_SITE_OTHER): Payer: 59 | Admitting: Psychiatry

## 2021-02-21 ENCOUNTER — Encounter: Payer: Self-pay | Admitting: Psychiatry

## 2021-02-21 VITALS — Wt 170.0 lb

## 2021-02-21 DIAGNOSIS — F3341 Major depressive disorder, recurrent, in partial remission: Secondary | ICD-10-CM | POA: Diagnosis not present

## 2021-02-21 DIAGNOSIS — F429 Obsessive-compulsive disorder, unspecified: Secondary | ICD-10-CM | POA: Diagnosis not present

## 2021-02-21 DIAGNOSIS — F5105 Insomnia due to other mental disorder: Secondary | ICD-10-CM

## 2021-02-21 DIAGNOSIS — R635 Abnormal weight gain: Secondary | ICD-10-CM | POA: Diagnosis not present

## 2021-02-21 MED ORDER — MODAFINIL 200 MG PO TABS: ORAL_TABLET | ORAL | 1 refills | Status: DC

## 2021-02-21 NOTE — Progress Notes (Signed)
Becky Mccoy [id number] November 11, 1985 35 y.o.    Subjective:   Patient ID:  Becky Mccoy is a 35 y.o. (DOB June 12, 1986) female.  Chief Complaint:  Chief Complaint  Patient presents with   Follow-up   Anxiety   Depression   Fatigue   Medication Problem    HPI Becky Mccoy presents  today for follow-up of exacerbation of severe OCD of the scrupulosity type.  At  visit October 10, 2018.  Patient with a history of scrupulosity which relapsed after 4 months off Prozac.  She had excessive weight gain from both sertraline and then Prozac.  The Prozac was effective at 60 mg a day.  Her symptoms were very severe and she wanted the maximal effect as soon as possible.  Therefore we increased the fluoxetine to 80 mg a day and added Abilify 5 mg to try to speed up the response rate.  It is anticipated that that will be discontinued later.    She had a very good response at her follow-up in April 2020.  She was encouraged and able to reduce the clonazepam if possible at night.  No other meds were changed at that time.  September 2020 appointment with the following noted: She was pregnant. She stopped the Abilify since last here.  She stopped the clonazepam with mild insomnia.  Sleep is fine now.   Had period of time of worsening OCD lasting 6 weeks and then resolved.  Under control now.  Still there but manageable.  Taking the fluoxetine 80 consistently.  On it 5 1/2 mos but only gained 2#.  Is pregnant.  Having a boy and 22 weeks.  [place] was done repeatedly.  Placental bleed resolved.  Growth of baby looks good.  No glucose checks yet.   [facility] January 20.   Plan continue fluoxetine 80 mg daily and follow-up after delivery.  03/15/2020 appointment with the following noted: Has not been seen since September 2020. Delivered 08/28/19 Becky Mccoy.  Doing well. No post-partum depression nor anxiety.  Glad to be back at work.  Did a lot better with this delivery. Stayed on fluoxetine 80 mg  daily. Still breastfeeding. No sig anxiety.  OCD is under control. No SE noted with Prozac except a bit tired and weight gain. Scared to reduce Prozac bc occ triggers. Plan no med changes  09/20/2020 appt with following noted: Covid free. Reduced fluoxetine to 60 mg and switched to night bc of tiredness for about 2-3 weeks.  Tiredness is a little better. Sleep 7-8 hours Dt kids.  Would like more if possible. OCD is pretty good unless triggered and then brief and gone. No other sig anxiety.  3 kids including 1 year son wide open.  Kids doing well.  7yo D caretaking. Intermittent fasting helps energy and focus.  Still breastfeeding. Plan no med changes  02/02/2021 phone call: She is under a lot of stress.  She had reduced Prozac from 60 to 40 mg about 4 weeks previous.  Wanting something for anxiety because of irritability.  She was breast-feeding 2 times a day for about 5 minutes each.  Indicated she was willing to stop breast-feeding. MD response: Why did she reduce the fluoxetine?Marland Kitchen  At the last appointment she was taking 60 mg in February and was doing well.  Reducing the fluoxetine could be the cause of her anxiety and irritability.  It seems to me that she should go back up in the fluoxetine dose to 60 mg daily.  It is true that that may take a week or so to work for the irritability and up to a few weeks to work for the anxiety.  Previously she is taking clonazepam 0.5 mg.  I can send in a prescription for that twice a day as needed for anxiety but she would have to completely stop breast-feeding in order to 2 take the clonazepam.  Verify that she is willing to do this and ask about the reasons for the fluoxetine reduction. Patient response via nurse:Rtc to pt and she reports she reduced the Fluoxetine to 40 mg because of her side effects, she reports she was tired all the time. She also contacted her pediatrician because she had some old Clonazepam, the pediatrician told her it was safe to take  just not to exceed 4 mg daily. She took 1/2 tablet of 0.5 mg and it took the edge off. She has only had to take it a couple of times.  She asked if a new Rx can be sent to her [facility] in [place] they are back from vacation Clonazepam 0.5 mg.  MD response: I will not agree to prescribe clonazepam unless she agrees to stop breast-feeding.  And by the way 4 mg of Klonopin is a very high dosage.  If she needs that high dose that she needs to get in to see me ASAP.  If she agrees to stop breast-feeding I will give her clonazepam 0.5 mg 1/2 to 1 tablet 3 times daily as needed anxiety. Patient was offered an appointment that week but refused due to work.  Situation is too complicated to address without an appointment.  02/21/2021 appointment with the following noted: CO Prozac starting to make her more sleepy 3 hours daily and then plenty of sleep at night going on for months and decided to cut the dose and started having sx 3-4 weeks ago.  Also unmotivated on it. Took Klonopin 1 mg and no more. Feeling better now not as agitated now and not too irritable.  Some days does but less severe. Still tired and naps daily but trying to keep herself awake and not as long. Sleep 7-8 hours at night.   Not depressed now.  Motivation is better on lower dose but not where she'd like. Anxiety is manageable.  Can still have some OCD moments on 40 mg and is a little worse on the lower dose of fluoxetine. SE hungry, tired and sweats Breastfeeding once at night and trying to wean.  Patient reports stable mood and denies depressed or irritable moods.  More myself now.   Denies appetite disturbance.  Patient reports that energy and motivation have been not as good as she'd like..  Patient denies any difficulty with concentration.  Patient denies any suicidal ideation.  Past Psychiatric Medication Trials: Fluoxetine 80 with benefit but weight gain, Trintellix,  sertraline 250 caused side effects and weight gain and  forgetfulness and sweating,  Phentermine irritable Abilify 5 mg Xanax was not effective, clonazepam was helpful for sleep and anxiety,   Review of Systems:  Review of Systems  Constitutional:  Positive for fatigue and unexpected weight change. Negative for diaphoresis.  Cardiovascular:  Negative for chest pain and palpitations.  Neurological:  Negative for tremors and weakness.  Psychiatric/Behavioral:  Negative for agitation, behavioral problems, confusion, decreased concentration, dysphoric mood, hallucinations, self-injury, sleep disturbance and suicidal ideas. The patient is not nervous/anxious and is not hyperactive.    Medications: I have reviewed the patient's current medications.  Current  Outpatient Medications  Medication Sig Dispense Refill   modafinil (PROVIGIL) 200 MG tablet 1/2 tablet in the AM for 1 week, then 1 each AM 30 tablet 1   acetaminophen (TYLENOL) 325 MG tablet Take 2 tablets (650 mg total) by mouth every 4 (four) hours as needed (for pain scale < 4).     FLUoxetine (PROZAC) 20 MG capsule TAKE 4 CAPSULES BY MOUTH EVERY DAY. PATIENT TAKE 80 MG EVERY DAY (Patient taking differently: 40 mg since [facility] May 2022) 360 capsule 1   No current facility-administered medications for this visit.    Medication Side Effects: None  Allergies:  Allergies  Allergen Reactions   Sulfa Antibiotics     Past Medical History:  Diagnosis Date   History of chicken pox    Hx: UTI (urinary tract infection)    Obsessive-compulsive and related disorder    OCD (obsessive compulsive disorder)     Family History  Problem Relation Age of Onset   Healthy Mother    Healthy Father    Cancer - Other Maternal Grandfather        stomach cancer   Cancer - Other Paternal Grandfather        Bone cancer    Social History   Socioeconomic History   Marital status: Married    Spouse name: Not on file   Number of children: Not on file   Years of education: Not on file   Highest  education level: Not on file  Occupational History   Not on file  Tobacco Use   Smoking status: Never   Smokeless tobacco: Never  Vaping Use   Vaping Use: Never used  Substance and Sexual Activity   Alcohol use: No    Alcohol/week: 0.0 standard drinks   Drug use: No   Sexual activity: Yes    Birth control/protection: None    Comment: Vasectomy  Other Topics Concern   Not on file  Social History Narrative   Not on file   Social Determinants of Health   Financial Resource Strain: Not on file  Food Insecurity: Not on file  Transportation Needs: Not on file  Physical Activity: Not on file  Stress: Not on file  Social Connections: Not on file  Intimate Partner Violence: Not on file    Past Medical History, Surgical history, Social history, and Family history were reviewed and updated as appropriate.   Please see review of systems for further details on the patient's review from today.   Objective:   Physical Exam:  Wt 170 lb (77.1 kg)   BMI 28.29 kg/m   Physical Exam Constitutional:      General: She is not in acute distress. Musculoskeletal:        General: No deformity.  Neurological:     Mental Status: She is alert and oriented to person, place, and time.     Coordination: Coordination normal.  Psychiatric:        Attention and Perception: Attention and perception normal. She does not perceive auditory or visual hallucinations.        Mood and Affect: Mood is anxious. Mood is not depressed. Affect is not labile, blunt, angry, tearful or inappropriate.        Speech: Speech normal.        Behavior: Behavior normal. Behavior is cooperative.        Thought Content: Thought content normal. Thought content is not paranoid or delusional. Thought content does not include homicidal or suicidal ideation. Thought content  does not include homicidal or suicidal plan.        Cognition and Memory: Cognition and memory normal.        Judgment: Judgment normal.     Comments:  Insight good.  OCD is under control with minimal anxiety.  She still has residual OCD but is managed.   Irritable    Lab Review:     Component Value Date/Time   NA 138 10/11/2020 0802   NA 142 03/24/2014 1617   K 4.3 10/11/2020 0802   K 4.0 03/24/2014 1617   CL 104 10/11/2020 0802   CL 105 03/24/2014 1617   CO2 26 10/11/2020 0802   CO2 26 03/24/2014 1617   GLUCOSE 91 10/11/2020 0802   GLUCOSE 93 03/24/2014 1617   BUN 19 10/11/2020 0802   BUN 13 03/24/2014 1617   CREATININE 0.58 10/11/2020 0802   CREATININE 0.62 03/24/2014 1617   CALCIUM 9.1 10/11/2020 0802   CALCIUM 9.5 03/24/2014 1617   PROT 7.0 10/11/2020 0802   ALBUMIN 4.2 10/11/2020 0802   AST 13 10/11/2020 0802   ALT 14 10/11/2020 0802   ALKPHOS 78 10/11/2020 0802   BILITOT 0.4 10/11/2020 0802   GFRNONAA >60 08/03/2016 1426   GFRNONAA >60 03/24/2014 1617   GFRAA >60 08/03/2016 1426   GFRAA >60 03/24/2014 1617       Component Value Date/Time   WBC 8.7 10/11/2020 0802   RBC 4.70 10/11/2020 0802   HGB 13.9 10/11/2020 0802   HGB 11.7 06/09/2019 1540   HCT 41.1 10/11/2020 0802   HCT 34.1 06/09/2019 1540   PLT 286.0 10/11/2020 0802   PLT 228 06/09/2019 1540   MCV 87.4 10/11/2020 0802   MCV 89 06/09/2019 1540   MCV 91 03/24/2014 1617   MCH 27.5 08/29/2019 0604   MCHC 33.9 10/11/2020 0802   RDW 12.7 10/11/2020 0802   RDW 13.0 06/09/2019 1540   RDW 12.9 03/24/2014 1617   LYMPHSABS 2.6 10/11/2020 0802   LYMPHSABS 1.3 06/09/2019 1540   LYMPHSABS 2.0 03/24/2014 1617   MONOABS 0.5 10/11/2020 0802   MONOABS 0.4 03/24/2014 1617   EOSABS 0.3 10/11/2020 0802   EOSABS 0.1 06/09/2019 1540   EOSABS 0.1 03/24/2014 1617   BASOSABS 0.0 10/11/2020 0802   BASOSABS 0.0 06/09/2019 1540   BASOSABS 0.0 03/24/2014 1617    No results found for: POCLITH, LITHIUM   No results found for: PHENYTOIN, PHENOBARB, VALPROATE, CBMZ   .res Assessment: Plan:    Obsessive-compulsive disorder, unspecified type  Recurrent major  depression in partial remission (HCC) - Plan: modafinil (PROVIGIL) 200 MG tablet  Insomnia due to mental condition  Weight gain due to medication    Patient with a history of scrupulosity which relapsed after 4 months off Prozac.  She had excessive weight gain from both sertraline and then Prozac.  The Prozac was effective at 60 mg a day.  Her symptoms were very severe at prior visit and she wanted the maximal effect as soon as possible.  Therefore per her request we  Increased fluoxetine to 80 mg a day which she has tolerated in the past.  We  also added about Abilify 5 mg to potentiate the antidepressant speed the recovery.    Fortunately that worked.  She was dramatically better in 5 weeks which is very unusual for OCD.   She was able to discontinue Abilify and clonazepam without unusual complications and the OCD has remained under control.  Fluoxetine has unfortunately caused weight gain as did  sertraline before it.  There is no other reasonable treatment of OCD that would be any less likely to cause weight gain.  She is tried phentermine but it caused irritability.    She reduced the fluoxetine again on her own, AMA, about a month ago DT sleepiness and experienced irritabilty and is at risk of OCD relapse.  Disc alternatives indepth including clomipramine as well as potentiators to reduce sleepiness and fatigue like modafinil and Wellbutrin.   Disc SE and differncecs  She agrees to trial of modafinil and is advised to increase the dose back to 60 mg fluoxetine but she's reluctant to do the latter.  Discussed Ozempic and weight loss and gave her a copy of the JAMA article and suggest she discuss this with her primary care doctor to see if it is an option.  FU  6 weeks  Meredith Staggers, MD, DFAPA  Please see After Visit Summary for patient specific instructions.  Future Appointments  Date Time Provider Department Center  03/22/2021 10:15 AM Cottle, Steva Ready., MD [facility] None  04/14/2021   8:30 AM Dale [place], MD [id number]  04/25/2021  9:30 AM Deirdre Evener, MD [facility] None  05/12/2021 11:30 AM Cottle, Steva Ready., MD [facility] None    No orders of the defined types were placed in this encounter.     -------------------------------

## 2021-03-22 ENCOUNTER — Ambulatory Visit: Payer: 59 | Admitting: Psychiatry

## 2021-03-23 ENCOUNTER — Encounter: Payer: Self-pay | Admitting: Internal Medicine

## 2021-03-23 NOTE — Telephone Encounter (Signed)
Agree with need for evaluation.  

## 2021-03-23 NOTE — Telephone Encounter (Signed)
Agree with need for evaluation now.

## 2021-03-23 NOTE — Telephone Encounter (Signed)
FYI   Spoke with patient to let her know that Dr Lorin Picket is out of office until Tuesday. Discussed options for treatment- acute care (in person), virtual urgent care, e-visit. Patient stated that she would feel more comfortable being seen in person so she is going to go to urgent care this PM to confirm nothing acute and have them send records to Dr Lorin Picket.

## 2021-03-24 ENCOUNTER — Telehealth: Payer: Managed Care, Other (non HMO) | Admitting: Physician Assistant

## 2021-03-24 DIAGNOSIS — J0140 Acute pansinusitis, unspecified: Secondary | ICD-10-CM

## 2021-03-24 MED ORDER — FLUTICASONE PROPIONATE 50 MCG/ACT NA SUSP: 2.0000 | Freq: Every day | NASAL | 0 refills | Status: DC

## 2021-03-24 MED ORDER — AMOXICILLIN-POT CLAVULANATE 875-125 MG PO TABS: 1.0000 | ORAL_TABLET | Freq: Two times a day (BID) | ORAL | 0 refills | Status: DC

## 2021-03-24 NOTE — Progress Notes (Signed)
Virtual Visit Consent   Becky Mccoy, you are scheduled for a virtual visit with a [place] provider today.     Just as with appointments in the office, your consent must be obtained to participate.  Your consent will be active for this visit and any virtual visit you may have with one of our providers in the next 365 days.     If you have a MyChart account, a copy of this consent can be sent to you electronically.  All virtual visits are billed to your insurance company just like a traditional visit in the office.    As this is a virtual visit, video technology does not allow for your provider to perform a traditional examination.  This may limit your provider's ability to fully assess your condition.  If your provider identifies any concerns that need to be evaluated in person or the need to arrange testing (such as labs, EKG, etc.), we will make arrangements to do so.     Although advances in technology are sophisticated, we cannot ensure that it will always work on either your end or our end.  If the connection with a video visit is poor, the visit may have to be switched to a telephone visit.  With either a video or telephone visit, we are not always able to ensure that we have a secure connection.     I need to obtain your verbal consent now.   Are you willing to proceed with your visit today?    Becky Mccoy has provided verbal consent on 03/24/2021 for a virtual visit (video or telephone).   Margaretann Loveless, PA-C   Date: 03/24/2021 11:40 AM   Virtual Visit via Video Note   I, Margaretann Loveless, connected with  Becky Mccoy  ([id number], [birth year]) on 03/24/21 at 11:15 AM EDT by a video-enabled telemedicine application and verified that I am speaking with the correct person using two identifiers.  Location: Patient: Virtual Visit Location Patient: Home Provider: Virtual Visit Location Provider: Home Office   I discussed the limitations of evaluation and  management by telemedicine and the availability of in person appointments. The patient expressed understanding and agreed to proceed.    History of Present Illness: Becky Mccoy is a 35 y.o. who identifies as a female who was assigned female at birth, and is being seen today for headache.  HPI: Headache  The quality of the pain is described as aching and dull. Associated symptoms include coughing, dizziness, ear pain (pressure), a fever (99.5), nausea (improved now), neck pain, rhinorrhea and sinus pressure. Pertinent negatives include no blurred vision, phonophobia, photophobia, scalp tenderness, sore throat, tinnitus, visual change or vomiting. The symptoms are aggravated by unknown. She has tried acetaminophen and NSAIDs (tylenol sinus, ibuprofen, aleve) for the symptoms. The treatment provided no relief. There is no history of cluster headaches, migraine headaches, migraines in the family or recent head traumas.   Was around a patient with Meningitis. Aunt passed from a brain aneurysm  Problems:  Patient Active Problem List   Diagnosis Date Noted   Breast feeding status of mother 10/27/2020   Cough 10/26/2020   Vaginal discharge 10/17/2020   Leg skin lesion, right 10/17/2020   Premature rupture of membranes (PROM) affecting fourth pregnancy 08/29/2019   Normal vaginal delivery 08/29/2019   Postpartum care following vaginal delivery 08/29/2019   Encounter for supervision of low-risk pregnancy, antepartum 01/09/2019   Hypercholesterolemia 06/18/2018   Nipple discharge 12/11/2016  Chest pain 12/11/2016   Health care maintenance 02/25/2015   Thyroid fullness 06/07/2014   OCD (obsessive compulsive disorder) 06/07/2014    Allergies:  Allergies  Allergen Reactions   Sulfa Antibiotics    Medications:  Current Outpatient Medications:    amoxicillin-clavulanate (AUGMENTIN) 875-125 MG tablet, Take 1 tablet by mouth 2 (two) times daily., Disp: 14 tablet, Rfl: 0   fluticasone  (FLONASE) 50 MCG/ACT nasal spray, Place 2 sprays into both nostrils daily., Disp: 16 g, Rfl: 0   acetaminophen (TYLENOL) 325 MG tablet, Take 2 tablets (650 mg total) by mouth every 4 (four) hours as needed (for pain scale < 4)., Disp:  , Rfl:    FLUoxetine (PROZAC) 20 MG capsule, TAKE 4 CAPSULES BY MOUTH EVERY DAY. PATIENT TAKE 80 MG EVERY DAY (Patient taking differently: 40 mg since Mid May 2022), Disp: 360 capsule, Rfl: 1   modafinil (PROVIGIL) 200 MG tablet, 1/2 tablet in the AM for 1 week, then 1 each AM, Disp: 30 tablet, Rfl: 1  Observations/Objective: Patient is well-developed, well-nourished in no acute distress.  Resting comfortably at home.  Head is normocephalic, atraumatic.  No labored breathing.  Speech is clear and coherent with logical content.  Patient is alert and oriented at baseline.  Able to move neck without issue or severe pain  Assessment and Plan: 1. Acute non-recurrent pansinusitis - fluticasone (FLONASE) 50 MCG/ACT nasal spray; Place 2 sprays into both nostrils daily.  Dispense: 16 g; Refill: 0 - amoxicillin-clavulanate (AUGMENTIN) 875-125 MG tablet; Take 1 tablet by mouth 2 (two) times daily.  Dispense: 14 tablet; Refill: 0  - Worsening symptoms that have not responded to OTC medications.  - Will give augmentin as below.  - Continue allergy medications.  - Stay well hydrated and get plenty of rest.  - Advised if no symptom improvement or if symptoms worsen please be seen in person for further evaluation.   Follow Up Instructions: I discussed the assessment and treatment plan with the patient. The patient was provided an opportunity to ask questions and all were answered. The patient agreed with the plan and demonstrated an understanding of the instructions.  A copy of instructions were sent to the patient via MyChart.  The patient was advised to call back or seek an in-person evaluation if the symptoms worsen or if the condition fails to improve as  anticipated.  Time:  I spent 14 minutes with the patient via telehealth technology discussing the above problems/concerns.    Margaretann Loveless, PA-C

## 2021-03-24 NOTE — Patient Instructions (Signed)
Becky Mccoy, thank you for joining Margaretann Loveless, PA-C for today's virtual visit.  While this provider is not your primary care provider (PCP), if your PCP is located in our provider database this encounter information will be shared with them immediately following your visit.  Consent: (Patient) Becky Mccoy provided verbal consent for this virtual visit at the beginning of the encounter.  Current Medications:  Current Outpatient Medications:    amoxicillin-clavulanate (AUGMENTIN) 875-125 MG tablet, Take 1 tablet by mouth 2 (two) times daily., Disp: 14 tablet, Rfl: 0   fluticasone (FLONASE) 50 MCG/ACT nasal spray, Place 2 sprays into both nostrils daily., Disp: 16 g, Rfl: 0   acetaminophen (TYLENOL) 325 MG tablet, Take 2 tablets (650 mg total) by mouth every 4 (four) hours as needed (for pain scale < 4)., Disp:  , Rfl:    FLUoxetine (PROZAC) 20 MG capsule, TAKE 4 CAPSULES BY MOUTH EVERY DAY. PATIENT TAKE 80 MG EVERY DAY (Patient taking differently: 40 mg since Mid May 2022), Disp: 360 capsule, Rfl: 1   modafinil (PROVIGIL) 200 MG tablet, 1/2 tablet in the AM for 1 week, then 1 each AM, Disp: 30 tablet, Rfl: 1   Medications ordered in this encounter:  Meds ordered this encounter  Medications   fluticasone (FLONASE) 50 MCG/ACT nasal spray    Sig: Place 2 sprays into both nostrils daily.    Dispense:  16 g    Refill:  0    Order Specific Question:   Supervising Provider    Answer:   MILLER, BRIAN [[id number]]   amoxicillin-clavulanate (AUGMENTIN) 875-125 MG tablet    Sig: Take 1 tablet by mouth 2 (two) times daily.    Dispense:  14 tablet    Refill:  0    Order Specific Question:   Supervising Provider    Answer:   Hyacinth Meeker, BRIAN [[id number]]     *If you need refills on other medications prior to your next appointment, please contact your pharmacy*  Follow-Up: Call back or seek an in-person evaluation if the symptoms worsen or if the condition fails to improve as  anticipated.  Other Instructions Sinusitis, Adult Sinusitis is soreness and swelling (inflammation) of your sinuses. Sinuses are hollow spaces in the bones around your face. They are located: Around your eyes. In the middle of your forehead. Behind your nose. In your cheekbones. Your sinuses and nasal passages are lined with a fluid called mucus. Mucus drains out of your sinuses. Swelling can trap mucus in your sinuses. This lets germs (bacteria, virus, or fungus) grow, which leads to infection. Most of the time, this condition is caused bya virus. What are the causes? This condition is caused by: Allergies. Asthma. Germs. Things that block your nose or sinuses. Growths in the nose (nasal polyps). Chemicals or irritants in the air. Fungus (rare). What increases the risk? You are more likely to develop this condition if: You have a weak body defense system (immune system). You do a lot of swimming or diving. You use nasal sprays too much. You smoke. What are the signs or symptoms? The main symptoms of this condition are pain and a feeling of pressure around the sinuses. Other symptoms include: Stuffy nose (congestion). Runny nose (drainage). Swelling and warmth in the sinuses. Headache. Toothache. A cough that may get worse at night. Mucus that collects in the throat or the back of the nose (postnasal drip). Being unable to smell and taste. Being very tired (fatigue). A fever. Sore throat. Bad  breath. How is this diagnosed? This condition is diagnosed based on: Your symptoms. Your medical history. A physical exam. Tests to find out if your condition is short-term (acute) or long-term (chronic). Your doctor may: Check your nose for growths (polyps). Check your sinuses using a tool that has a light (endoscope). Check for allergies or germs. Do imaging tests, such as an MRI or CT scan. How is this treated? Treatment for this condition depends on the cause and whether it  is short-term or long-term. If caused by a virus, your symptoms should go away on their own within 10 days. You may be given medicines to relieve symptoms. They include: Medicines that shrink swollen tissue in the nose. Medicines that treat allergies (antihistamines). A spray that treats swelling of the nostrils.  Rinses that help get rid of thick mucus in your nose (nasal saline washes). If caused by bacteria, your doctor may wait to see if you will get better without treatment. You may be given antibiotic medicine if you have: A very bad infection. A weak body defense system. If caused by growths in the nose, you may need to have surgery. Follow these instructions at home: Medicines Take, use, or apply over-the-counter and prescription medicines only as told by your doctor. These may include nasal sprays. If you were prescribed an antibiotic medicine, take it as told by your doctor. Do not stop taking the antibiotic even if you start to feel better. Hydrate and humidify  Drink enough water to keep your pee (urine) pale yellow. Use a cool mist humidifier to keep the humidity level in your home above 50%. Breathe in steam for 10-15 minutes, 3-4 times a day, or as told by your doctor. You can do this in the bathroom while a hot shower is running. Try not to spend time in cool or dry air.  Rest Rest as much as you can. Sleep with your head raised (elevated). Make sure you get enough sleep each night. General instructions  Put a warm, moist washcloth on your face 3-4 times a day, or as often as told by your doctor. This will help with discomfort. Wash your hands often with soap and water. If there is no soap and water, use hand sanitizer. Do not smoke. Avoid being around people who are smoking (secondhand smoke). Keep all follow-up visits as told by your doctor. This is important.  Contact a doctor if: You have a fever. Your symptoms get worse. Your symptoms do not get better within  10 days. Get help right away if: You have a very bad headache. You cannot stop throwing up (vomiting). You have very bad pain or swelling around your face or eyes. You have trouble seeing. You feel confused. Your neck is stiff. You have trouble breathing. Summary Sinusitis is swelling of your sinuses. Sinuses are hollow spaces in the bones around your face. This condition is caused by tissues in your nose that become inflamed or swollen. This traps germs. These can lead to infection. If you were prescribed an antibiotic medicine, take it as told by your doctor. Do not stop taking it even if you start to feel better. Keep all follow-up visits as told by your doctor. This is important. This information is not intended to replace advice given to you by your health care provider. Make sure you discuss any questions you have with your healthcare provider. Document Revised: 12/31/2017 Document Reviewed: 12/31/2017 Elsevier Patient Education  2022 [employer].    If you have  been instructed to have an in-person evaluation today at a local Urgent Care facility, please use the link below. It will take you to a list of all of our available [facility], including address, phone number and hours of operation. Please do not delay care.  [facility]  If you or a family member do not have a primary care provider, use the link below to schedule a visit and establish care. When you choose a [facility] primary care physician or advanced practice provider, you gain a long-term partner in health. Find a Primary Care Provider  Learn more about 's in-office and virtual care options: [facility]

## 2021-04-14 ENCOUNTER — Ambulatory Visit (INDEPENDENT_AMBULATORY_CARE_PROVIDER_SITE_OTHER): Payer: Managed Care, Other (non HMO) | Admitting: Internal Medicine

## 2021-04-14 ENCOUNTER — Other Ambulatory Visit: Payer: Self-pay

## 2021-04-14 VITALS — BP 112/70 | HR 87 | Temp 97.8°F | Resp 16 | Ht 65.0 in | Wt 169.8 lb

## 2021-04-14 DIAGNOSIS — F339 Major depressive disorder, recurrent, unspecified: Secondary | ICD-10-CM | POA: Diagnosis not present

## 2021-04-14 DIAGNOSIS — E7800 Pure hypercholesterolemia, unspecified: Secondary | ICD-10-CM

## 2021-04-14 DIAGNOSIS — R635 Abnormal weight gain: Secondary | ICD-10-CM | POA: Diagnosis not present

## 2021-04-14 DIAGNOSIS — F429 Obsessive-compulsive disorder, unspecified: Secondary | ICD-10-CM

## 2021-04-14 NOTE — Progress Notes (Signed)
Patient ID: Becky Mccoy, female   DOB: 1985/11/07, 35 y.o.   MRN: [id number]   Subjective:    Patient ID: Becky Mccoy, female    DOB: 12-06-1985, 35 y.o.   MRN: [id number]  This visit occurred during the SARS-CoV-2 public health emergency.  Safety protocols were in place, including screening questions prior to the visit, additional usage of staff PPE, and extensive cleaning of exam room while observing appropriate contact time as indicated for disinfecting solutions.   Patient here for scheduled follow up.  Chief Complaint  Patient presents with   Weight Gain   .   HPI F/u appt.  She was concerned regarding weight gain.  Stays active - running after her youngest.  No chest pain or sob with increased activity or exertion. No cough or congestion.  No acid reflux or abdominal pain or cramping reported.  Bowels stable.  Discussed diet and exercise.  Check labs.     Past Medical History:  Diagnosis Date   History of chicken pox    Hx: UTI (urinary tract infection)    Obsessive-compulsive and related disorder    OCD (obsessive compulsive disorder)    Past Surgical History:  Procedure Laterality Date   WISDOM TOOTH EXTRACTION     Family History  Problem Relation Age of Onset   Healthy Mother    Healthy Father    Cancer - Other Maternal Grandfather        stomach cancer   Cancer - Other Paternal Grandfather        Bone cancer   Social History   Socioeconomic History   Marital status: Married    Spouse name: Not on file   Number of children: Not on file   Years of education: Not on file   Highest education level: Not on file  Occupational History   Not on file  Tobacco Use   Smoking status: Never   Smokeless tobacco: Never  Vaping Use   Vaping Use: Never used  Substance and Sexual Activity   Alcohol use: No    Alcohol/week: 0.0 standard drinks   Drug use: No   Sexual activity: Yes    Birth control/protection: None    Comment: Vasectomy  Other Topics Concern    Not on file  Social History Narrative   Not on file   Social Determinants of Health   Financial Resource Strain: Not on file  Food Insecurity: Not on file  Transportation Needs: Not on file  Physical Activity: Not on file  Stress: Not on file  Social Connections: Not on file    Review of Systems  Constitutional:  Negative for appetite change.       Is concerned regarding weight gain.    HENT:  Negative for congestion and sinus pressure.   Respiratory:  Negative for cough, chest tightness and shortness of breath.   Cardiovascular:  Negative for chest pain, palpitations and leg swelling.  Gastrointestinal:  Negative for abdominal pain, diarrhea, nausea and vomiting.  Genitourinary:  Negative for difficulty urinating and dysuria.  Musculoskeletal:  Negative for joint swelling and myalgias.  Skin:  Negative for color change and rash.  Neurological:  Negative for dizziness, light-headedness and headaches.  Psychiatric/Behavioral:  Negative for agitation and dysphoric mood.       Objective:     BP 112/70   Pulse 87   Temp 97.8 F (36.6 C)   Resp 16   Ht 5\' 5"  (1.651 m)   Wt 169 lb 12.8  oz (77 kg)   SpO2 98%   BMI 28.26 kg/m  Wt Readings from Last 3 Encounters:  04/14/21 169 lb 12.8 oz (77 kg)  01/27/21 162 lb (73.5 kg)  10/26/20 165 lb (74.8 kg)    Physical Exam Vitals reviewed.  Constitutional:      General: She is not in acute distress.    Appearance: Normal appearance.  HENT:     Head: Normocephalic and atraumatic.     Right Ear: External ear normal.     Left Ear: External ear normal.  Eyes:     General: No scleral icterus.       Right eye: No discharge.        Left eye: No discharge.     Conjunctiva/sclera: Conjunctivae normal.  Neck:     Thyroid: No thyromegaly.  Cardiovascular:     Rate and Rhythm: Normal rate and regular rhythm.  Pulmonary:     Effort: No respiratory distress.     Breath sounds: Normal breath sounds. No wheezing.  Abdominal:      General: Bowel sounds are normal.     Palpations: Abdomen is soft.     Tenderness: There is no abdominal tenderness.  Musculoskeletal:        General: No swelling or tenderness.     Cervical back: Neck supple. No tenderness.  Lymphadenopathy:     Cervical: No cervical adenopathy.  Skin:    Findings: No erythema or rash.  Neurological:     Mental Status: She is alert.  Psychiatric:        Mood and Affect: Mood normal.        Behavior: Behavior normal.     Outpatient Encounter Medications as of 04/14/2021  Medication Sig   acetaminophen (TYLENOL) 325 MG tablet Take 2 tablets (650 mg total) by mouth every 4 (four) hours as needed (for pain scale < 4).   FLUoxetine (PROZAC) 20 MG capsule TAKE 4 CAPSULES BY MOUTH EVERY DAY. PATIENT TAKE 80 MG EVERY DAY (Patient taking differently: 60 mg q day)   fluticasone (FLONASE) 50 MCG/ACT nasal spray Place 2 sprays into both nostrils daily.   modafinil (PROVIGIL) 200 MG tablet 1/2 tablet in the AM for 1 week, then 1 each AM (Patient taking differently: Take 100 mg by mouth daily.)   [DISCONTINUED] amoxicillin-clavulanate (AUGMENTIN) 875-125 MG tablet Take 1 tablet by mouth 2 (two) times daily.   No facility-administered encounter medications on file as of 04/14/2021.     Lab Results  Component Value Date   WBC 8.7 10/11/2020   HGB 13.9 10/11/2020   HCT 41.1 10/11/2020   PLT 286.0 10/11/2020   GLUCOSE 91 10/11/2020   CHOL 227 (H) 10/11/2020   TRIG 298.0 (H) 10/11/2020   HDL 40.40 10/11/2020   LDLDIRECT 135.0 10/11/2020   LDLCALC 152 (H) 09/19/2018   ALT 14 10/11/2020   AST 13 10/11/2020   NA 138 10/11/2020   K 4.3 10/11/2020   CL 104 10/11/2020   CREATININE 0.58 10/11/2020   BUN 19 10/11/2020   CO2 26 10/11/2020   TSH 1.52 10/11/2020       Assessment & Plan:   Problem List Items Addressed This Visit     Depression, major, recurrent (HCC)    Seeing psychiatry. Doing better on current medication regimen.  Follow.  Continue f/u  with psychiatry.       Hypercholesterolemia - Primary    Low cholesterol diet and exercise.  Follow lipid panel.  Relevant Orders   Comprehensive metabolic panel   Lipid panel   OCD (obsessive compulsive disorder)    Seeing psychiatry.  Doing better on current medication regimen.  Follow.       Weight gain    She is concerned regarding weight gain.  Discussed.  Discussed diet and exercise.  Check routine labs.  Follow.          Dale [place]uya, MD

## 2021-04-18 ENCOUNTER — Encounter: Payer: Self-pay | Admitting: Internal Medicine

## 2021-04-18 DIAGNOSIS — F339 Major depressive disorder, recurrent, unspecified: Secondary | ICD-10-CM | POA: Insufficient documentation

## 2021-04-18 DIAGNOSIS — R635 Abnormal weight gain: Secondary | ICD-10-CM | POA: Insufficient documentation

## 2021-04-18 NOTE — Assessment & Plan Note (Signed)
She is concerned regarding weight gain.  Discussed.  Discussed diet and exercise.  Check routine labs.  Follow.

## 2021-04-18 NOTE — Assessment & Plan Note (Signed)
Seeing psychiatry. Doing better on current medication regimen.  Follow.  Continue f/u with psychiatry.

## 2021-04-18 NOTE — Assessment & Plan Note (Signed)
Seeing psychiatry.  Doing better on current medication regimen.  Follow.

## 2021-04-18 NOTE — Assessment & Plan Note (Signed)
Low cholesterol diet and exercise.  Follow lipid panel.   

## 2021-04-25 ENCOUNTER — Ambulatory Visit: Payer: 59 | Admitting: Dermatology

## 2021-04-25 ENCOUNTER — Ambulatory Visit: Admit: 2021-04-25 | Payer: Managed Care, Other (non HMO)

## 2021-04-26 ENCOUNTER — Encounter: Payer: Self-pay | Admitting: Internal Medicine

## 2021-04-26 ENCOUNTER — Ambulatory Visit: Payer: Managed Care, Other (non HMO)

## 2021-04-27 ENCOUNTER — Inpatient Hospital Stay: Admission: RE | Admit: 2021-04-27 | Payer: Managed Care, Other (non HMO) | Source: Ambulatory Visit

## 2021-04-27 ENCOUNTER — Other Ambulatory Visit: Payer: Self-pay

## 2021-04-27 ENCOUNTER — Encounter: Payer: Self-pay | Admitting: Emergency Medicine

## 2021-04-27 DIAGNOSIS — R1011 Right upper quadrant pain: Secondary | ICD-10-CM | POA: Insufficient documentation

## 2021-04-27 DIAGNOSIS — R110 Nausea: Secondary | ICD-10-CM | POA: Insufficient documentation

## 2021-04-27 DIAGNOSIS — R1013 Epigastric pain: Secondary | ICD-10-CM | POA: Insufficient documentation

## 2021-04-27 LAB — COMPREHENSIVE METABOLIC PANEL
ALT: 20 U/L (ref 0–44)
AST: 21 U/L (ref 15–41)
Albumin: 4.6 g/dL (ref 3.5–5.0)
Alkaline Phosphatase: 73 U/L (ref 38–126)
Anion gap: 10 (ref 5–15)
BUN: 24 mg/dL — ABNORMAL HIGH (ref 6–20)
CO2: 25 mmol/L (ref 22–32)
Calcium: 9.5 mg/dL (ref 8.9–10.3)
Chloride: 102 mmol/L (ref 98–111)
Creatinine, Ser: 0.66 mg/dL (ref 0.44–1.00)
GFR, Estimated: >60 mL/min (ref >60)
Glucose, Bld: 98 mg/dL (ref 70–99)
Potassium: 3.9 mmol/L (ref 3.5–5.1)
Sodium: 137 mmol/L (ref 135–145)
Total Bilirubin: 0.6 mg/dL (ref 0.3–1.2)
Total Protein: 7.6 g/dL (ref 6.5–8.1)

## 2021-04-27 LAB — CBC WITH DIFFERENTIAL/PLATELET
Abs Immature Granulocytes: 0.04 10*3/uL (ref 0.00–0.07)
Basophils Absolute: 0 10*3/uL (ref 0.0–0.1)
Basophils Relative: 1 %
Eosinophils Absolute: 0.2 10*3/uL (ref 0.0–0.5)
Eosinophils Relative: 3 %
HCT: 42 % (ref 36.0–46.0)
Hemoglobin: 14.6 g/dL (ref 12.0–15.0)
Immature Granulocytes: 1 %
Lymphocytes Relative: 39 %
Lymphs Abs: 2.6 10*3/uL (ref 0.7–4.0)
MCH: 31.1 pg (ref 26.0–34.0)
MCHC: 34.8 g/dL (ref 30.0–36.0)
MCV: 89.4 fL (ref 80.0–100.0)
Monocytes Absolute: 0.4 10*3/uL (ref 0.1–1.0)
Monocytes Relative: 6 %
Neutro Abs: 3.5 10*3/uL (ref 1.7–7.7)
Neutrophils Relative %: 50 %
Platelets: 347 10*3/uL (ref 150–400)
RBC: 4.7 MIL/uL (ref 3.87–5.11)
RDW: 12.4 % (ref 11.5–15.5)
WBC: 6.7 10*3/uL (ref 4.0–10.5)
nRBC: 0 % (ref 0.0–0.2)

## 2021-04-27 LAB — URINALYSIS, ROUTINE W REFLEX MICROSCOPIC
Bacteria, UA: NONE SEEN
Bilirubin Urine: NEGATIVE
Glucose, UA: NEGATIVE mg/dL
Ketones, ur: NEGATIVE mg/dL
Nitrite: NEGATIVE
Protein, ur: NEGATIVE mg/dL
Specific Gravity, Urine: 1.025 (ref 1.005–1.030)
pH: 5 (ref 5.0–8.0)

## 2021-04-27 LAB — POC URINE PREG, ED: Preg Test, Ur: NEGATIVE

## 2021-04-27 LAB — LIPASE, BLOOD: Lipase: 38 U/L (ref 11–51)

## 2021-04-27 NOTE — Telephone Encounter (Signed)
If having abdominal pain, persistent - needs to be seen.  Let her know that I am not in the office this pm - need to go ahead and be seen

## 2021-04-27 NOTE — ED Triage Notes (Signed)
Patient ambulatory to triage with steady gait, without difficulty or distress noted; pt reports rt lower abd pain x 3 days accomp by nausea and bloating; st pain increases after eating

## 2021-04-28 ENCOUNTER — Emergency Department
Admission: EM | Admit: 2021-04-28 | Discharge: 2021-04-28 | Disposition: A | Discharge status: Home / Self Care | Payer: Managed Care, Other (non HMO) | Attending: Emergency Medicine | Admitting: Emergency Medicine

## 2021-04-28 ENCOUNTER — Other Ambulatory Visit: Payer: Self-pay

## 2021-04-28 ENCOUNTER — Emergency Department: Payer: Managed Care, Other (non HMO)

## 2021-04-28 ENCOUNTER — Other Ambulatory Visit (INDEPENDENT_AMBULATORY_CARE_PROVIDER_SITE_OTHER): Payer: Managed Care, Other (non HMO)

## 2021-04-28 DIAGNOSIS — E7800 Pure hypercholesterolemia, unspecified: Secondary | ICD-10-CM | POA: Diagnosis not present

## 2021-04-28 DIAGNOSIS — R1011 Right upper quadrant pain: Secondary | ICD-10-CM

## 2021-04-28 LAB — COMPREHENSIVE METABOLIC PANEL
ALT: 16 U/L (ref 0–35)
AST: 17 U/L (ref 0–37)
Albumin: 4.5 g/dL (ref 3.5–5.2)
Alkaline Phosphatase: 62 U/L (ref 39–117)
BUN: 19 mg/dL (ref 6–23)
CO2: 27 mEq/L (ref 19–32)
Calcium: 9.4 mg/dL (ref 8.4–10.5)
Chloride: 104 mEq/L (ref 96–112)
Creatinine, Ser: 0.75 mg/dL (ref 0.40–1.20)
GFR: 103.9 mL/min (ref >60.00)
Glucose, Bld: 95 mg/dL (ref 70–99)
Potassium: 4.1 mEq/L (ref 3.5–5.1)
Sodium: 140 mEq/L (ref 135–145)
Total Bilirubin: 0.6 mg/dL (ref 0.2–1.2)
Total Protein: 7.5 g/dL (ref 6.0–8.3)

## 2021-04-28 LAB — LDL CHOLESTEROL, DIRECT: Direct LDL: 157 mg/dL

## 2021-04-28 LAB — LIPID PANEL
Cholesterol: 239 mg/dL — ABNORMAL HIGH (ref 0–200)
HDL: 41.4 mg/dL (ref >39.00)
NonHDL: 197.71
Total CHOL/HDL Ratio: 6
Triglycerides: 263 mg/dL — ABNORMAL HIGH (ref 0.0–149.0)
VLDL: 52.6 mg/dL — ABNORMAL HIGH (ref 0.0–40.0)

## 2021-04-28 MED ORDER — ONDANSETRON 4 MG PO TBDP
4.0000 mg | ORAL_TABLET | Freq: Once | ORAL | Status: AC
  Administered 2021-04-28: 4 mg via ORAL
  Filled 2021-04-28: qty 1

## 2021-04-28 MED ORDER — IBUPROFEN 600 MG PO TABS
600.0000 mg | ORAL_TABLET | Freq: Once | ORAL | Status: AC
  Administered 2021-04-28: 600 mg via ORAL
  Filled 2021-04-28: qty 1

## 2021-04-28 MED ORDER — PANTOPRAZOLE SODIUM 40 MG PO TBEC: 40.0000 mg | DELAYED_RELEASE_TABLET | Freq: Every day | ORAL | 1 refills | Status: DC

## 2021-04-28 NOTE — Discharge Instructions (Addendum)

## 2021-04-28 NOTE — ED Provider Notes (Signed)
[facility] Emergency Department Provider Note  ____________________________________________  Time seen: Approximately 1:39 AM  I have reviewed the triage vital signs and the nursing notes.   HISTORY  Chief Complaint Abdominal Pain   HPI Becky Mccoy is a 35 y.o. female with no significant past medical history who presents for evaluation of abdominal pain.  Patient reports that the pain started 3 days ago.  The pain has been intermittent, sharp located in the epigastric and right upper quadrant associated with nausea.  Pain is worse postprandially.  Patient reports that this evening the pain became more severe and more constant which prompted her visit to the emergency room although at this point the pain has now subsided.  She is on her last day of her menses.  She denies flank pain, lower abdominal pain, vomiting, diarrhea, constipation, dysuria, hematuria.  She denies any prior abdominal surgeries.  Past Medical History:  Diagnosis Date   History of chicken pox    Hx: UTI (urinary tract infection)    Obsessive-compulsive and related disorder    OCD (obsessive compulsive disorder)     Patient Active Problem List   Diagnosis Date Noted   Depression, major, recurrent (HCC) 04/18/2021   Weight gain 04/18/2021   Breast feeding status of mother 10/27/2020   Cough 10/26/2020   Vaginal discharge 10/17/2020   Leg skin lesion, right 10/17/2020   Premature rupture of membranes (PROM) affecting fourth pregnancy 08/29/2019   Normal vaginal delivery 08/29/2019   Postpartum care following vaginal delivery 08/29/2019   Encounter for supervision of low-risk pregnancy, antepartum 01/09/2019   Hypercholesterolemia 06/18/2018   Nipple discharge 12/11/2016   Chest pain 12/11/2016   Health care maintenance 02/25/2015   Thyroid fullness 06/07/2014   OCD (obsessive compulsive disorder) 06/07/2014    Past Surgical History:  Procedure Laterality Date   WISDOM  TOOTH EXTRACTION      Prior to Admission medications   Medication Sig Start Date End Date Taking? Authorizing Provider  pantoprazole (PROTONIX) 40 MG tablet Take 1 tablet (40 mg total) by mouth daily. 04/28/21 04/28/22 Yes Don Perking, [place], MD  acetaminophen (TYLENOL) 325 MG tablet Take 2 tablets (650 mg total) by mouth every 4 (four) hours as needed (for pain scale < 4). 08/29/19   Farrel Conners, CNM  FLUoxetine (PROZAC) 20 MG capsule TAKE 4 CAPSULES BY MOUTH EVERY DAY. PATIENT TAKE 80 MG EVERY DAY Patient taking differently: 60 mg q day 09/20/20   Lauraine Rinne., MD  fluticasone [facility]) 50 MCG/ACT nasal spray Place 2 sprays into both nostrils daily. 03/24/21   Margaretann Loveless, PA-C  modafinil (PROVIGIL) 200 MG tablet 1/2 tablet in the AM for 1 week, then 1 each AM Patient taking differently: Take 100 mg by mouth daily. 02/21/21   Cottle, Steva Ready., MD    Allergies Sulfa antibiotics  Family History  Problem Relation Age of Onset   Healthy Mother    Healthy Father    Cancer - Other Maternal Grandfather        stomach cancer   Cancer - Other Paternal Grandfather        Bone cancer    Social History Social History   Tobacco Use   Smoking status: Never   Smokeless tobacco: Never  Vaping Use   Vaping Use: Never used  Substance Use Topics   Alcohol use: No    Alcohol/week: 0.0 standard drinks   Drug use: No    Review of Systems  Constitutional:  Negative for fever. Eyes: Negative for visual changes. ENT: Negative for sore throat. Neck: No neck pain  Cardiovascular: Negative for chest pain. Respiratory: Negative for shortness of breath. Gastrointestinal: + upper abdominal pain and nausea. No vomiting or diarrhea. Genitourinary: Negative for dysuria. Musculoskeletal: Negative for back pain. Skin: Negative for rash. Neurological: Negative for headaches, weakness or numbness. Psych: No SI or HI  ____________________________________________   PHYSICAL  EXAM:  VITAL SIGNS: ED Triage Vitals  Enc Vitals Group     BP 04/27/21 1925 118/64     Pulse Rate 04/27/21 1925 80     Resp 04/27/21 1925 20     Temp 04/27/21 1925 97.9 F (36.6 C)     Temp Source 04/27/21 1925 Oral     SpO2 04/27/21 1925 99 %     Weight 04/27/21 1922 166 lb (75.3 kg)     Height 04/27/21 1922 5\' 6"  (1.676 m)     Head Circumference --      Peak Flow --      Pain Score 04/27/21 1922 4     Pain Loc --      Pain Edu? --      Excl. in GC? --     Constitutional: Alert and oriented. Well appearing and in no apparent distress. HEENT:      Head: Normocephalic and atraumatic.         Eyes: Conjunctivae are normal. Sclera is non-icteric.       Mouth/Throat: Mucous membranes are moist.       Neck: Supple with no signs of meningismus. Cardiovascular: Regular rate and rhythm. No murmurs, gallops, or rubs. 2+ symmetrical distal pulses are present in all extremities. No JVD. Respiratory: Normal respiratory effort. Lungs are clear to auscultation bilaterally.  Gastrointestinal: Soft, non tender, and non distended with positive bowel sounds. No rebound or guarding. Genitourinary: No CVA tenderness. Musculoskeletal:  No edema, cyanosis, or erythema of extremities. Neurologic: Normal speech and language. Face is symmetric. Moving all extremities. No gross focal neurologic deficits are appreciated. Skin: Skin is warm, dry and intact. No rash noted. Psychiatric: Mood and affect are normal. Speech and behavior are normal.  ____________________________________________   LABS (all labs ordered are listed, but only abnormal results are displayed)  Labs Reviewed  COMPREHENSIVE METABOLIC PANEL - Abnormal; Notable for the following components:      Result Value   BUN 24 (*)    All other components within normal limits  URINALYSIS, ROUTINE W REFLEX MICROSCOPIC - Abnormal; Notable for the following components:   Color, Urine YELLOW (*)    APPearance HAZY (*)    Hgb urine dipstick  MODERATE (*)    Leukocytes,Ua MODERATE (*)    All other components within normal limits  CBC WITH DIFFERENTIAL/PLATELET  LIPASE, BLOOD  POC URINE PREG, ED   ____________________________________________  EKG  none  ____________________________________________  RADIOLOGY  I have personally reviewed the images performed during this visit and I agree with the Radiologist's read.   Interpretation by Radiologist:  04/29/21 ABDOMEN LIMITED RUQ (LIVER/GB)  Result Date: 04/28/2021 CLINICAL DATA:  Right upper quadrant pain x3 days. EXAM: ULTRASOUND ABDOMEN LIMITED RIGHT UPPER QUADRANT COMPARISON:  None. FINDINGS: Gallbladder: No gallstones or wall thickening visualized (1.5 mm). No sonographic Murphy sign noted by sonographer. Common bile duct: Diameter: 3.4 mm Liver: No focal lesion identified. Diffusely increased echogenicity of the liver parenchyma is noted. Portal vein is patent on color Doppler imaging with normal direction of blood flow towards the liver. Other:  None. IMPRESSION: Hepatic steatosis. Electronically Signed   By: Aram Candela M.D.   On: 04/28/2021 01:41     ____________________________________________   PROCEDURES  Procedure(s) performed: None Procedures Critical Care performed:  None ____________________________________________   INITIAL IMPRESSION / ASSESSMENT AND PLAN / ED COURSE  35 y.o. female with no significant past medical history who presents for evaluation of intermittent sharp epigastric and right upper quadrant pain for the last 3 days associated with nausea, worse postprandially.  No pain at this time.  On exam she is well-appearing in no distress with normal vital signs.  Abdomen is soft with no tenderness throughout.  Differential diagnosis including gallbladder disease versus pancreatitis versus peptic ulcer disease versus gastritis.  Less likely appendicitis with intermittent symptoms and no pain or tenderness at this time.    Labs showing normal  white count, negative pregnancy test, normal LFTs and lipase.  UA with some blood and patient is currently on her menses but no signs of urinary tract infection.  We will send patient for right upper quadrant ultrasound.  _________________________ 2:02 AM on 04/28/2021 ----------------------------------------- Right upper quadrant ultrasound negative.  Patient remains with no further episodes of abdominal pain.  With negative work-up and no pain or tenderness I feel the patient is stable for discharge with follow-up with her primary care doctor.  I will send a referral to GI for possible evaluation of gastritis/peptic ulcer disease.  We will start patient on Protonix.  Recommended not taking any NSAIDs at home.  Recommended return to the emergency room if the pain returns.      _____________________________________________ Please note:  Patient was evaluated in Emergency Department today for the symptoms described in the history of present illness. Patient was evaluated in the context of the global COVID-19 pandemic, which necessitated consideration that the patient might be at risk for infection with the SARS-CoV-2 virus that causes COVID-19. Institutional protocols and algorithms that pertain to the evaluation of patients at risk for COVID-19 are in a state of rapid change based on information released by regulatory bodies including the CDC and federal and state organizations. These policies and algorithms were followed during the patient's care in the ED.  Some ED evaluations and interventions may be delayed as a result of limited staffing during the pandemic.   [place] Controlled Substance Database was reviewed by me. ____________________________________________   FINAL CLINICAL IMPRESSION(S) / ED DIAGNOSES   Final diagnoses:  RUQ abdominal pain      NEW MEDICATIONS STARTED DURING THIS VISIT:  ED Discharge Orders          Ordered    Ambulatory referral to Gastroenterology         04/28/21 0201    pantoprazole (PROTONIX) 40 MG tablet  Daily        04/28/21 0202             Note:  This document was prepared using Dragon voice recognition software and may include unintentional dictation errors.    Nita Sickle, MD 04/28/21 [phone]

## 2021-04-29 NOTE — Telephone Encounter (Signed)
Given that there was concern regarding an ulcer, recommend the protonix that they gave her to take to help with the pain.  Avoid antiinflammatories.  Can take tylenol.

## 2021-04-29 NOTE — Telephone Encounter (Signed)
Patient was at ED She has a stomach ulcer and would like medication to help her stomach for on and off pain. She has to work this weekend and really needs something.

## 2021-05-02 NOTE — Telephone Encounter (Signed)
Please call and confirm she is doing ok.  Was seen in ER.  Had ultrasound.  Placed on protonix. We can refer her to GI.  Is she ok to stay in town?  [facility] (Dr Servando Snare, Dr Tobi Bastos, Dr Allegra Lai and Dr Maximino Greenland).  [facility] has GI MDs in [place] (if she is wanting [place] area).  Also, [facility] has GI MDs as well.  Ok to refer. If any acute problems,needs to be reevaluated.

## 2021-05-02 NOTE — Telephone Encounter (Signed)
LMTCB

## 2021-05-02 NOTE — Telephone Encounter (Signed)
Patient confirmed doing ok- says her symptoms come and go. She was referred to [facility] by the ED but was unsure about doing GI consult. Patient stated she has decided to move forward with GI appt. She is calling  GI to schedule. Will let me know if she needs anything.

## 2021-05-03 ENCOUNTER — Other Ambulatory Visit: Payer: Self-pay

## 2021-05-03 ENCOUNTER — Ambulatory Visit (INDEPENDENT_AMBULATORY_CARE_PROVIDER_SITE_OTHER): Payer: Managed Care, Other (non HMO) | Admitting: Gastroenterology

## 2021-05-03 ENCOUNTER — Encounter: Payer: Self-pay | Admitting: Gastroenterology

## 2021-05-03 VITALS — BP 123/83 | HR 86 | Temp 98.5°F | Ht 65.0 in | Wt 168.4 lb

## 2021-05-03 DIAGNOSIS — R1013 Epigastric pain: Secondary | ICD-10-CM | POA: Diagnosis not present

## 2021-05-03 DIAGNOSIS — K760 Fatty (change of) liver, not elsewhere classified: Secondary | ICD-10-CM | POA: Diagnosis not present

## 2021-05-03 NOTE — Patient Instructions (Signed)
High-Fiber Eating Plan °Fiber, also called dietary fiber, is a type of carbohydrate. It is found foods such as fruits, vegetables, whole grains, and beans. A high-fiber diet can have many health benefits. Your health care provider may recommend a high-fiber diet to help: °Prevent constipation. Fiber can make your bowel movements more regular. °Lower your cholesterol. °Relieve the following conditions: °Inflammation of veins in the anus (hemorrhoids). °Inflammation of specific areas of the digestive tract (uncomplicated diverticulosis). °A problem of the large intestine, also called the colon, that sometimes causes pain and diarrhea (irritable bowel syndrome, or IBS). °Prevent overeating as part of a weight-loss plan. °Prevent heart disease, type 2 diabetes, and certain cancers. °What are tips for following this plan? °Reading food labels ° °Check the nutrition facts label on food products for the amount of dietary fiber. Choose foods that have 5 grams of fiber or more per serving. °The goals for recommended daily fiber intake include: °Men (age 50 or younger): 34-38 g. °Men (over age 50): 28-34 g. °Women (age 50 or younger): 25-28 g. °Women (over age 50): 22-25 g. °Your daily fiber goal is _____________ g. °Shopping °Choose whole fruits and vegetables instead of processed forms, such as apple juice or applesauce. °Choose a wide variety of high-fiber foods such as avocados, lentils, oats, and kidney beans. °Read the nutrition facts label of the foods you choose. Be aware of foods with added fiber. These foods often have high sugar and sodium amounts per serving. °Cooking °Use whole-grain flour for baking and cooking. °Cook with brown rice instead of white rice. °Meal planning °Start the day with a breakfast that is high in fiber, such as a cereal that contains 5 g of fiber or more per serving. °Eat breads and cereals that are made with whole-grain flour instead of refined flour or white flour. °Eat brown rice, bulgur  wheat, or millet instead of white rice. °Use beans in place of meat in soups, salads, and pasta dishes. °Be sure that half of the grains you eat each day are whole grains. °General information °You can get the recommended daily intake of dietary fiber by: °Eating a variety of fruits, vegetables, grains, nuts, and beans. °Taking a fiber supplement if you are not able to take in enough fiber in your diet. It is better to get fiber through food than from a supplement. °Gradually increase how much fiber you consume. If you increase your intake of dietary fiber too quickly, you may have bloating, cramping, or gas. °Drink plenty of water to help you digest fiber. °Choose high-fiber snacks, such as berries, raw vegetables, nuts, and popcorn. °What foods should I eat? °Fruits °Berries. Pears. Apples. Oranges. Avocado. Prunes and raisins. Dried figs. °Vegetables °Sweet potatoes. Spinach. Kale. Artichokes. Cabbage. Broccoli. Cauliflower. Green peas. Carrots. Squash. °Grains °Whole-grain breads. Multigrain cereal. Oats and oatmeal. Brown rice. Barley. Bulgur wheat. Millet. Quinoa. Bran muffins. Popcorn. Rye wafer crackers. °Meats and other proteins °Navy beans, kidney beans, and pinto beans. Soybeans. Split peas. Lentils. Nuts and seeds. °Dairy °Fiber-fortified yogurt. °Beverages °Fiber-fortified soy milk. Fiber-fortified orange juice. °Other foods °Fiber bars. °The items listed above may not be a complete list of recommended foods and beverages. Contact a dietitian for more information. °What foods should I avoid? °Fruits °Fruit juice. Cooked, strained fruit. °Vegetables °Fried potatoes. Canned vegetables. Well-cooked vegetables. °Grains °White bread. Pasta made with refined flour. White rice. °Meats and other proteins °Fatty cuts of meat. Fried chicken or fried fish. °Dairy °Milk. Yogurt. Cream cheese. Sour cream. °Fats and   oils °Butters. °Beverages °Soft drinks. °Other foods °Cakes and pastries. °The items listed above may  not be a complete list of foods and beverages to avoid. Talk with your dietitian about what choices are best for you. °Summary °Fiber is a type of carbohydrate. It is found in foods such as fruits, vegetables, whole grains, and beans. °A high-fiber diet has many benefits. It can help to prevent constipation, lower blood cholesterol, aid weight loss, and reduce your risk of heart disease, diabetes, and certain cancers. °Increase your intake of fiber gradually. Increasing fiber too quickly may cause cramping, bloating, and gas. Drink plenty of water while you increase the amount of fiber you consume. °The best sources of fiber include whole fruits and vegetables, whole grains, nuts, seeds, and beans. °This information is not intended to replace advice given to you by your health care provider. Make sure you discuss any questions you have with your health care provider. °Document Revised: 12/04/2019 Document Reviewed: 12/04/2019 °Elsevier Patient Education © 2022 [employer] Inc. ° °

## 2021-05-03 NOTE — Progress Notes (Signed)
Arlyss Repress, MD [address]  [address]  [place], [place] [address]  Main: [phone]  Fax: [phone]    Gastroenterology Consultation  Referring Provider:     Nita Sickle, MD Primary Care Physician:  Dale [place], MD Primary Gastroenterologist:  Dr. Arlyss Repress Reason for Consultation:     Upper abdominal pain        HPI:   Becky Mccoy is a 35 y.o. female referred by Dr. Dale , MD  for consultation & management of upper abdominal pain.  Approximately 2 weeks ago, patient developed sudden onset of sharp, stabbing epigastric discomfort that was intermittent associated with nausea, abdominal bloating.  She went to the ER on 9/14, labs were unremarkable for acute pancreatitis, normal CMP, normal CBC.  She underwent right upper quadrant ultrasound to evaluate for cholelithiasis which was negative except for hepatic steatosis.  She was discharged on Protonix 40 mg daily.  Patient reports ongoing epigastric discomfort associated with abdominal bloating, nausea and loss of appetite.  She did have an episode of constipation for which she took senna and had some relief.  She is recently started on high-protein, low-carb diet for hyperlipidemia.  She eats out about once a week  Patient denies family history of GI malignancy, celiac disease  NSAIDs: None  Antiplts/Anticoagulants/Anti thrombotics: None  GI Procedures: None  Past Medical History:  Diagnosis Date   History of chicken pox    Hx: UTI (urinary tract infection)    Obsessive-compulsive and related disorder    OCD (obsessive compulsive disorder)     Past Surgical History:  Procedure Laterality Date   WISDOM TOOTH EXTRACTION        Family History  Problem Relation Age of Onset   Healthy Mother    Healthy Father    Cancer - Other Maternal Grandfather        stomach cancer   Cancer - Other Paternal Grandfather        Bone cancer     Social History   Tobacco Use   Smoking  status: Never   Smokeless tobacco: Never  Vaping Use   Vaping Use: Never used  Substance Use Topics   Alcohol use: No    Alcohol/week: 0.0 standard drinks   Drug use: No    Allergies as of 05/03/2021 - Review Complete 05/03/2021  Allergen Reaction Noted   Sulfa antibiotics  06/01/2014    Current Outpatient Medications:    FLUoxetine (PROZAC) 20 MG capsule, TAKE 4 CAPSULES BY MOUTH EVERY DAY. PATIENT TAKE 80 MG EVERY DAY (Patient taking differently: 60 mg q day), Disp: 360 capsule, Rfl: 1   modafinil (PROVIGIL) 200 MG tablet, 1/2 tablet in the AM for 1 week, then 1 each AM (Patient taking differently: Take 100 mg by mouth daily.), Disp: 30 tablet, Rfl: 1   acetaminophen (TYLENOL) 325 MG tablet, Take 2 tablets (650 mg total) by mouth every 4 (four) hours as needed (for pain scale < 4). (Patient not taking: Reported on 05/03/2021), Disp:  , Rfl:    fluticasone (FLONASE) 50 MCG/ACT nasal spray, Place 2 sprays into both nostrils daily. (Patient not taking: Reported on 05/03/2021), Disp: 16 g, Rfl: 0   pantoprazole (PROTONIX) 40 MG tablet, Take 1 tablet (40 mg total) by mouth daily. (Patient not taking: Reported on 05/03/2021), Disp: 30 tablet, Rfl: 1  Review of Systems:    All systems reviewed and negative except where noted in HPI.   Physical Exam:  BP 123/83 (  BP Location: Left Arm, Patient Position: Sitting, Cuff Size: Normal)   Pulse 86   Temp 98.5 F (36.9 C) (Oral)   Ht 5\' 5"  (1.651 m)   Wt 168 lb 6.4 oz (76.4 kg)   LMP 04/27/2021 (Exact Date)   BMI 28.02 kg/m  Patient's last menstrual period was 04/27/2021 (exact date).  General:   Alert,  Well-developed, well-nourished, pleasant and cooperative in NAD Head:  Normocephalic and atraumatic. Eyes:  Sclera clear, no icterus.   Conjunctiva pink. Ears:  Normal auditory acuity. Nose:  No deformity, discharge, or lesions. Mouth:  No deformity or lesions,oropharynx pink & moist. Neck:  Supple; no masses or thyromegaly. Lungs:   Respirations even and unlabored.  Clear throughout to auscultation.   No wheezes, crackles, or rhonchi. No acute distress. Heart:  Regular rate and rhythm; no murmurs, clicks, rubs, or gallops. Abdomen:  Normal bowel sounds.  Mild epigastric tenderness, moderately distended, tympanic to percussion without masses, hepatosplenomegaly or hernias noted.  No guarding or rebound tenderness.   Rectal: Not performed Msk:  Symmetrical without gross deformities. Good, equal movement & strength bilaterally. Pulses:  Normal pulses noted. Extremities:  No clubbing or edema.  No cyanosis. Neurologic:  Alert and oriented x3;  grossly normal neurologically. Skin:  Intact without significant lesions or rashes. No jaundice. Psych:  Alert and cooperative. Normal mood and affect.  Imaging Studies: Reviewed  Assessment and Plan:   Becky Mccoy is a 35 y.o. pleasant Caucasian female with no significant past medical history is seen in consultation for 2 weeks history of symptoms of dyspepsia including upper abdominal discomfort, abdominal bloating, nausea and loss of appetite.  Right upper quadrant ultrasound is negative for cholelithiasis.  Serum lipase levels were normal.    Dyspepsia Recommend H. pylori breath test, H. pylori IgG levels since patient is on Protonix and treat if positive If above tests are negative, if symptoms are persistent, recommend CT abdomen and pelvis with contrast Will also consider HIDA scan to evaluate for biliary dyskinesia  Fatty liver Discussed about pathogenesis of fatty liver, its progression and possibility of cirrhosis Reiterated on healthy diet and exercise  Intermittent constipation Discussed about high-fiber diet, information provided  Follow up in 3 months   20, MD

## 2021-05-04 LAB — H. PYLORI BREATH TEST: H pylori Breath Test: NEGATIVE

## 2021-05-05 ENCOUNTER — Encounter: Payer: Self-pay | Admitting: Gastroenterology

## 2021-05-05 LAB — H. PYLORI ANTIBODY, IGG: H. pylori, IgG AbS: 0.22 Index Value (ref 0.00–0.79)

## 2021-05-12 ENCOUNTER — Telehealth (INDEPENDENT_AMBULATORY_CARE_PROVIDER_SITE_OTHER): Payer: 59 | Admitting: Psychiatry

## 2021-05-12 ENCOUNTER — Encounter: Payer: Self-pay | Admitting: Psychiatry

## 2021-05-12 DIAGNOSIS — F5105 Insomnia due to other mental disorder: Secondary | ICD-10-CM

## 2021-05-12 DIAGNOSIS — F3341 Major depressive disorder, recurrent, in partial remission: Secondary | ICD-10-CM

## 2021-05-12 DIAGNOSIS — F429 Obsessive-compulsive disorder, unspecified: Secondary | ICD-10-CM

## 2021-05-12 DIAGNOSIS — R635 Abnormal weight gain: Secondary | ICD-10-CM

## 2021-05-12 MED ORDER — FLUOXETINE HCL 20 MG PO CAPS: 60.0000 mg | ORAL_CAPSULE | Freq: Every day | ORAL | 1 refills | Status: DC

## 2021-05-12 MED ORDER — MODAFINIL 200 MG PO TABS: 200.0000 mg | ORAL_TABLET | Freq: Every day | ORAL | 1 refills | Status: DC

## 2021-05-12 NOTE — Progress Notes (Signed)
Becky Mccoy [id number] Jan 01, 1986 35 y.o.   Video Visit via My Chart  I connected with pt by My Chart and verified that I am speaking with the correct person using two identifiers.   I discussed the limitations, risks, security and privacy concerns of performing an evaluation and management service by My Chart  and the availability of in person appointments. I also discussed with the patient that there may be a patient responsible charge related to this service. The patient expressed understanding and agreed to proceed.  I discussed the assessment and treatment plan with the patient. The patient was provided an opportunity to ask questions and all were answered. The patient agreed with the plan and demonstrated an understanding of the instructions.   The patient was advised to call back or seek an in-person evaluation if the symptoms worsen or if the condition fails to improve as anticipated.  I provided 15 minutes of video time during this encounter.  The patient was located at home and the provider was located office. Appointment 1145 until noon  Subjective:   Patient ID:  Becky Mccoy is a 35 y.o. (DOB Jan 25, 1986) female.  Chief Complaint:  Chief Complaint  Patient presents with   Follow-up   OCD     HPI Becky Mccoy presents  today for follow-up of exacerbation of severe OCD of the scrupulosity type.  At  visit October 10, 2018.  Patient with a history of scrupulosity which relapsed after 4 months off Prozac.  She had excessive weight gain from both sertraline and then Prozac.  The Prozac was effective at 60 mg a day.  Her symptoms were very severe and she wanted the maximal effect as soon as possible.  Therefore we increased the fluoxetine to 80 mg a day and added Abilify 5 mg to try to speed up the response rate.  It is anticipated that that will be discontinued later.    She had a very good response at her follow-up in April 2020.  She was encouraged and able to  reduce the clonazepam if possible at night.  No other meds were changed at that time.  September 2020 appointment with the following noted: She was pregnant. She stopped the Abilify since last here.  She stopped the clonazepam with mild insomnia.  Sleep is fine now.   Had period of time of worsening OCD lasting 6 weeks and then resolved.  Under control now.  Still there but manageable.  Taking the fluoxetine 80 consistently.  On it 5 1/2 mos but only gained 2#.  Is pregnant.  Having a boy and 22 weeks.  [place] was done repeatedly.  Placental bleed resolved.  Growth of baby looks good.  No glucose checks yet.   [facility] January 20.   Plan continue fluoxetine 80 mg daily and follow-up after delivery.  03/15/2020 appointment with the following noted: Has not been seen since September 2020. Delivered 08/28/19 Dereck Ligas.  Doing well. No post-partum depression nor anxiety.  Glad to be back at work.  Did a lot better with this delivery. Stayed on fluoxetine 80 mg daily. Still breastfeeding. No sig anxiety.  OCD is under control. No SE noted with Prozac except a bit tired and weight gain. Scared to reduce Prozac bc occ triggers. Plan no med changes  09/20/2020 appt with following noted: Covid free. Reduced fluoxetine to 60 mg and switched to night bc of tiredness for about 2-3 weeks.  Tiredness is a little better. Sleep 7-8 hours  Dt kids.  Would like more if possible. OCD is pretty good unless triggered and then brief and gone. No other sig anxiety.  3 kids including 1 year son wide open.  Kids doing well.  7yo D caretaking. Intermittent fasting helps energy and focus.  Still breastfeeding. Plan no med changes  02/02/2021 phone call: She is under a lot of stress.  She had reduced Prozac from 60 to 40 mg about 4 weeks previous.  Wanting something for anxiety because of irritability.  She was breast-feeding 2 times a day for about 5 minutes each.  Indicated she was willing to stop breast-feeding. MD  response: Why did she reduce the fluoxetine?Marland Kitchen  At the last appointment she was taking 60 mg in February and was doing well.  Reducing the fluoxetine could be the cause of her anxiety and irritability.  It seems to me that she should go back up in the fluoxetine dose to 60 mg daily.  It is true that that may take a week or so to work for the irritability and up to a few weeks to work for the anxiety.  Previously she is taking clonazepam 0.5 mg.  I can send in a prescription for that twice a day as needed for anxiety but she would have to completely stop breast-feeding in order to 2 take the clonazepam.  Verify that she is willing to do this and ask about the reasons for the fluoxetine reduction. Patient response via nurse:Rtc to pt and she reports she reduced the Fluoxetine to 40 mg because of her side effects, she reports she was tired all the time. She also contacted her pediatrician because she had some old Clonazepam, the pediatrician told her it was safe to take just not to exceed 4 mg daily. She took 1/2 tablet of 0.5 mg and it took the edge off. She has only had to take it a couple of times.  She asked if a new Rx can be sent to her [facility] in [place] they are back from vacation Clonazepam 0.5 mg.  MD response: I will not agree to prescribe clonazepam unless she agrees to stop breast-feeding.  And by the way 4 mg of Klonopin is a very high dosage.  If she needs that high dose that she needs to get in to see me ASAP.  If she agrees to stop breast-feeding I will give her clonazepam 0.5 mg 1/2 to 1 tablet 3 times daily as needed anxiety. Patient was offered an appointment that week but refused due to work.  Situation is too complicated to address without an appointment.  02/21/2021 appointment with the following noted: CO Prozac starting to make her more sleepy 3 hours daily and then plenty of sleep at night going on for months and decided to cut the dose and started having sx 3-4 weeks ago.  Also  unmotivated on it. Took Klonopin 1 mg and no more. Feeling better now not as agitated now and not too irritable.  Some days does but less severe. Still tired and naps daily but trying to keep herself awake and not as long. Sleep 7-8 hours at night.   Not depressed now.  Motivation is better on lower dose but not where she'd like. Anxiety is manageable.  Can still have some OCD moments on 40 mg and is a little worse on the lower dose of fluoxetine. SE hungry, tired and sweats Breastfeeding once at night and trying to wean.  05/12/21 appt noted: Anxiety a lot better  fluoxetine 60. OCD managed. Modafinil 100-200 helps motivation and sleepiness as long as gets enough sleep.  Had to reduce caffeine. Son interferes with sleep but otherwise sleeps fine.  Patient reports stable mood and denies depressed or irritable moods.  More myself now.   Denies appetite disturbance.  Patient reports that energy and motivation have been not as good as she'd like..  Patient denies any difficulty with concentration.  Patient denies any suicidal ideation.  Past Psychiatric Medication Trials: Fluoxetine 80 with benefit but weight gain, Trintellix,  sertraline 250 caused side effects and weight gain and forgetfulness and sweating,  Phentermine irritable Abilify 5 mg Xanax was not effective, clonazepam was helpful for sleep and anxiety,   Review of Systems:  Review of Systems  Constitutional:  Negative for diaphoresis, fatigue and unexpected weight change.  Cardiovascular:  Negative for chest pain and palpitations.  Gastrointestinal:  Negative for abdominal pain.  Neurological:  Negative for tremors and weakness.  Psychiatric/Behavioral:  Negative for agitation, behavioral problems, confusion, decreased concentration, dysphoric mood, hallucinations, self-injury, sleep disturbance and suicidal ideas. The patient is not nervous/anxious and is not hyperactive.    Medications: I have reviewed the patient's current  medications.  Current Outpatient Medications  Medication Sig Dispense Refill   FLUoxetine (PROZAC) 20 MG capsule Take 3 capsules (60 mg total) by mouth daily. 60 mg q day 270 capsule 1   modafinil (PROVIGIL) 200 MG tablet Take 1 tablet (200 mg total) by mouth daily. 90 tablet 1   pantoprazole (PROTONIX) 40 MG tablet Take 1 tablet (40 mg total) by mouth daily. (Patient not taking: No sig reported) 30 tablet 1   No current facility-administered medications for this visit.    Medication Side Effects: None  Allergies:  Allergies  Allergen Reactions   Sulfa Antibiotics     Past Medical History:  Diagnosis Date   History of chicken pox    Hx: UTI (urinary tract infection)    Obsessive-compulsive and related disorder    OCD (obsessive compulsive disorder)     Family History  Problem Relation Age of Onset   Healthy Mother    Healthy Father    Cancer - Other Maternal Grandfather        stomach cancer   Cancer - Other Paternal Grandfather        Bone cancer    Social History   Socioeconomic History   Marital status: Married    Spouse name: Not on file   Number of children: Not on file   Years of education: Not on file   Highest education level: Not on file  Occupational History   Not on file  Tobacco Use   Smoking status: Never   Smokeless tobacco: Never  Vaping Use   Vaping Use: Never used  Substance and Sexual Activity   Alcohol use: No    Alcohol/week: 0.0 standard drinks   Drug use: No   Sexual activity: Yes    Birth control/protection: None    Comment: Vasectomy  Other Topics Concern   Not on file  Social History Narrative   Not on file   Social Determinants of Health   Financial Resource Strain: Not on file  Food Insecurity: Not on file  Transportation Needs: Not on file  Physical Activity: Not on file  Stress: Not on file  Social Connections: Not on file  Intimate Partner Violence: Not on file    Past Medical History, Surgical history, Social  history, and Family history were reviewed and updated  as appropriate.   Please see review of systems for further details on the patient's review from today.   Objective:   Physical Exam:  LMP 04/27/2021 (Exact Date)   Physical Exam Constitutional:      General: She is not in acute distress. Musculoskeletal:        General: No deformity.  Neurological:     Mental Status: She is alert and oriented to person, place, and time.     Coordination: Coordination normal.  Psychiatric:        Attention and Perception: Attention and perception normal. She does not perceive auditory or visual hallucinations.        Mood and Affect: Mood is not depressed. Affect is not labile, blunt, angry, tearful or inappropriate.        Speech: Speech normal.        Behavior: Behavior normal. Behavior is cooperative.        Thought Content: Thought content normal. Thought content is not paranoid or delusional. Thought content does not include homicidal or suicidal ideation. Thought content does not include homicidal or suicidal plan.        Cognition and Memory: Cognition and memory normal.        Judgment: Judgment normal.     Comments: Insight good.  OCD is under control with minimal anxiety.  She still has residual OCD but is managed.       Lab Review:     Component Value Date/Time   NA 140 04/28/2021 0808   NA 142 03/24/2014 1617   K 4.1 04/28/2021 0808   K 4.0 03/24/2014 1617   CL 104 04/28/2021 0808   CL 105 03/24/2014 1617   CO2 27 04/28/2021 0808   CO2 26 03/24/2014 1617   GLUCOSE 95 04/28/2021 0808   GLUCOSE 93 03/24/2014 1617   BUN 19 04/28/2021 0808   BUN 13 03/24/2014 1617   CREATININE 0.75 04/28/2021 0808   CREATININE 0.62 03/24/2014 1617   CALCIUM 9.4 04/28/2021 0808   CALCIUM 9.5 03/24/2014 1617   PROT 7.5 04/28/2021 0808   ALBUMIN 4.5 04/28/2021 0808   AST 17 04/28/2021 0808   ALT 16 04/28/2021 0808   ALKPHOS 62 04/28/2021 0808   BILITOT 0.6 04/28/2021 0808   GFRNONAA >60  04/27/2021 1928   GFRNONAA >60 03/24/2014 1617   GFRAA >60 08/03/2016 1426   GFRAA >60 03/24/2014 1617       Component Value Date/Time   WBC 6.7 04/27/2021 1928   RBC 4.70 04/27/2021 1928   HGB 14.6 04/27/2021 1928   HGB 11.7 06/09/2019 1540   HCT 42.0 04/27/2021 1928   HCT 34.1 06/09/2019 1540   PLT 347 04/27/2021 1928   PLT 228 06/09/2019 1540   MCV 89.4 04/27/2021 1928   MCV 89 06/09/2019 1540   MCV 91 03/24/2014 1617   MCH 31.1 04/27/2021 1928   MCHC 34.8 04/27/2021 1928   RDW 12.4 04/27/2021 1928   RDW 13.0 06/09/2019 1540   RDW 12.9 03/24/2014 1617   LYMPHSABS 2.6 04/27/2021 1928   LYMPHSABS 1.3 06/09/2019 1540   LYMPHSABS 2.0 03/24/2014 1617   MONOABS 0.4 04/27/2021 1928   MONOABS 0.4 03/24/2014 1617   EOSABS 0.2 04/27/2021 1928   EOSABS 0.1 06/09/2019 1540   EOSABS 0.1 03/24/2014 1617   BASOSABS 0.0 04/27/2021 1928   BASOSABS 0.0 06/09/2019 1540   BASOSABS 0.0 03/24/2014 1617    No results found for: POCLITH, LITHIUM   No results found for: PHENYTOIN, PHENOBARB, VALPROATE, CBMZ   .  res Assessment: Plan:    Obsessive-compulsive disorder, unspecified type - Plan: FLUoxetine (PROZAC) 20 MG capsule  Recurrent major depression in partial remission (HCC) - Plan: modafinil (PROVIGIL) 200 MG tablet  Insomnia due to mental condition  Weight gain due to medication    Patient with a history of scrupulosity which relapsed after 4 months off Prozac.  She had excessive weight gain from both sertraline and then Prozac.  The Prozac was effective at 60 mg a day.  Her symptoms were very severe at prior visit and she wanted the maximal effect as soon as possible.  Therefore per her request we  Increased fluoxetine to 80 mg a day which she has tolerated in the past.  We  also added about Abilify 5 mg to potentiate the antidepressant speed the recovery.    Fortunately that worked.  She was dramatically better in 5 weeks which is very unusual for OCD.   She was able to  discontinue Abilify and clonazepam without unusual complications and the OCD has remained under control.  Fluoxetine has unfortunately caused weight gain as did sertraline before it.  There is no other reasonable treatment of OCD that would be any less likely to cause weight gain.  She tried phentermine but it caused irritability.    She reduced the fluoxetine again on her own, AMA, 2022 DT sleepiness and experienced irritabilty which increased risk of OCD relapse.  Disc alternatives indepth including clomipramine as well as potentiators to reduce sleepiness and fatigue like modafinil and Wellbutrin.   Disc SE and differncecs  Modafinil 100-200 mg  resolved the sleepiness from 60 mg fluoxetine.  The anxiety and OCD are under adequate control at this time on this dosage.  Previously discussed Ozempic and weight loss and gave her a copy of the JAMA article and suggest she discuss this with her primary care doctor to see if it is an option.  FU  3-4 mos  Meredith Staggers, MD, DFAPA  Please see After Visit Summary for patient specific instructions.  Future Appointments  Date Time Provider Department Center  08/03/2021  2:45 PM Toney Reil, MD [facility] None  10/17/2021  9:00 AM Dale [place], MD [id number]    No orders of the defined types were placed in this encounter.     -------------------------------

## 2021-07-01 ENCOUNTER — Encounter: Payer: Self-pay | Admitting: Internal Medicine

## 2021-07-01 ENCOUNTER — Telehealth: Payer: Self-pay

## 2021-07-01 NOTE — Telephone Encounter (Signed)
Ok for glucometer.  I am not sure if we have sample here.  Ok to send in rx if needed.  Also, ok to schedule appt.  Needs to make sure eating regular meals and make sure getting protein with meals.  Also, protein snacks in between meals.  Any acute issues or problems, needs to be seen.

## 2021-07-01 NOTE — Telephone Encounter (Signed)
Sent to me by mistake 

## 2021-07-01 NOTE — Telephone Encounter (Signed)
Hey, I just wanted to let you know, I was working Tuesday night, and all of the sudden I felt so sick and I was going to pass out, I was sweety, hot, nauseated, felt like I could poop also, then after that I was shaking  and really cold, my work took my blood sugar it was low, I was really tired they called 911. I did not end up going to the ed. I am wondering if I have hypoglycemia. What should I do to monitor this? It took me over 24 hours to get pass being to drained from this.    Becky Mccoy

## 2021-07-01 NOTE — Telephone Encounter (Signed)
Spoke with patient. Confirmed that she is having no acute symptoms at this time. Tuesday night while at work she started feeling sick, felt like she could pass out, clammy, got really hot then was cold and shaky. EMS came to evaluated. Sugar was 67. Had only been about 4 hours since eating. She does not have a way to check sugars at home but did check her sugar today at work and was 87. She is wondering if we can get a glucometer to spot check her sugars and set up appt with you within the next few weeks to discuss.

## 2021-07-01 NOTE — Telephone Encounter (Signed)
Patient is going to get glucometer over the counter. Advised of message below. Will spot check sugars and bring to appt on 11/30

## 2021-07-07 ENCOUNTER — Telehealth: Payer: Managed Care, Other (non HMO) | Admitting: Emergency Medicine

## 2021-07-07 DIAGNOSIS — U071 COVID-19: Secondary | ICD-10-CM | POA: Diagnosis not present

## 2021-07-07 MED ORDER — ALBUTEROL SULFATE HFA 108 (90 BASE) MCG/ACT IN AERS: 2.0000 | INHALATION_SPRAY | RESPIRATORY_TRACT | 0 refills | Status: DC | PRN

## 2021-07-07 MED ORDER — SPACER/AERO-HOLDING CHAMBERS DEVI: 1.0000 | 0 refills | Status: DC | PRN

## 2021-07-07 MED ORDER — BENZONATATE 100 MG PO CAPS: 100.0000 mg | ORAL_CAPSULE | Freq: Two times a day (BID) | ORAL | 0 refills | Status: DC | PRN

## 2021-07-07 NOTE — Progress Notes (Signed)
Virtual Visit Consent   SHUNTIA EXTON, you are scheduled for a virtual visit with a [facility] provider today.     Just as with appointments in the office, your consent must be obtained to participate.  Your consent will be active for this visit and any virtual visit you may have with one of our providers in the next 365 days.     If you have a MyChart account, a copy of this consent can be sent to you electronically.  All virtual visits are billed to your insurance company just like a traditional visit in the office.    As this is a virtual visit, video technology does not allow for your provider to perform a traditional examination.  This may limit your provider's ability to fully assess your condition.  If your provider identifies any concerns that need to be evaluated in person or the need to arrange testing (such as labs, EKG, etc.), we will make arrangements to do so.     Although advances in technology are sophisticated, we cannot ensure that it will always work on either your end or our end.  If the connection with a video visit is poor, the visit may have to be switched to a telephone visit.  With either a video or telephone visit, we are not always able to ensure that we have a secure connection.     I need to obtain your verbal consent now.   Are you willing to proceed with your visit today?    TISHIA MAESTRE has provided verbal consent on 07/07/2021 for a virtual visit (video or telephone).   Cathlyn Parsons, NP   Date: 07/07/2021 1:57 PM   Virtual Visit via Video Note   I, Cathlyn Parsons, connected with  AAMARI WEST  ([id number], [birth year]) on 07/07/21 at  1:45 PM EST by a video-enabled telemedicine application and verified that I am speaking with the correct person using two identifiers.  Audio did not work for video visit and we converted to a telephone visit  Location: Patient: Virtual Visit Location Patient: Home Provider: Virtual Visit Location Provider:  Home Office   I discussed the limitations of evaluation and management by telemedicine and the availability of in person appointments. The patient expressed understanding and agreed to proceed.    History of Present Illness: JAUNA RACZYNSKI is a 35 y.o. who identifies as a female who was assigned female at birth, and is being seen today for a cough due to COVID.  She tested positive for COVID last Sunday 07/03/21.  She still has chills and a low-grade fever of 99 along with body aches and a headache.  She has managed the headache and body aches with ibuprofen.  Her temp this morning is improved from her T-max of 100.9 earlier this week.  She does feel like she is better, however her cough is progressively getting worse.  She now feels short of breath and wheezing.  She has no history of asthma.  The other members of her household also have COVID.  HPI: HPI  Problems:  Patient Active Problem List   Diagnosis Date Noted   Depression, major, recurrent (HCC) 04/18/2021   Weight gain 04/18/2021   Breast feeding status of mother 10/27/2020   Cough 10/26/2020   Vaginal discharge 10/17/2020   Leg skin lesion, right 10/17/2020   Premature rupture of membranes (PROM) affecting fourth pregnancy 08/29/2019   Normal vaginal delivery 08/29/2019   Postpartum care following vaginal  delivery 08/29/2019   Encounter for supervision of low-risk pregnancy, antepartum 01/09/2019   Hypercholesterolemia 06/18/2018   Nipple discharge 12/11/2016   Chest pain 12/11/2016   Health care maintenance 02/25/2015   Thyroid fullness 06/07/2014   OCD (obsessive compulsive disorder) 06/07/2014    Allergies:  Allergies  Allergen Reactions   Sulfa Antibiotics    Medications:  Current Outpatient Medications:    albuterol (VENTOLIN HFA) 108 (90 Base) MCG/ACT inhaler, Inhale 2 puffs into the lungs every 4 (four) hours as needed for wheezing or shortness of breath., Disp: 8 g, Rfl: 0   benzonatate (TESSALON) 100 MG  capsule, Take 1 capsule (100 mg total) by mouth 2 (two) times daily as needed for cough., Disp: 20 capsule, Rfl: 0   Spacer/Aero-Holding Chambers DEVI, 1 each by Does not apply route as needed., Disp: 1 each, Rfl: 0   FLUoxetine (PROZAC) 20 MG capsule, Take 3 capsules (60 mg total) by mouth daily. 60 mg q day, Disp: 270 capsule, Rfl: 1   modafinil (PROVIGIL) 200 MG tablet, Take 1 tablet (200 mg total) by mouth daily., Disp: 90 tablet, Rfl: 1  Observations/Objective: Patient is well-developed, well-nourished in no acute distress.  Resting comfortably  at home.  Head is normocephalic, atraumatic.  No labored breathing.  Speech is clear and coherent with logical content.  Patient is alert and oriented at baseline.    Assessment and Plan: 1. COVID-19  I think patient would benefit from an albuterol inhaler and spacer.  I have prescribed these for the [facility] that is open 24 hours a day in [place].  Follow Up Instructions: I discussed the assessment and treatment plan with the patient. The patient was provided an opportunity to ask questions and all were answered. The patient agreed with the plan and demonstrated an understanding of the instructions.  A copy of instructions were sent to the patient via MyChart unless otherwise noted below.   The patient was advised to call back or seek an in-person evaluation if the symptoms worsen or if the condition fails to improve as anticipated.  Time:  I spent 14 minutes with the patient via telehealth technology discussing the above problems/concerns.    Cathlyn Parsons, NP

## 2021-07-07 NOTE — Patient Instructions (Signed)
Becky Mccoy, thank you for joining Cathlyn Parsons, NP for today's virtual visit.  While this provider is not your primary care provider (PCP), if your PCP is located in our provider database this encounter information will be shared with them immediately following your visit.  Consent: (Patient) Becky Mccoy provided verbal consent for this virtual visit at the beginning of the encounter.  Current Medications:  Current Outpatient Medications:    albuterol (VENTOLIN HFA) 108 (90 Base) MCG/ACT inhaler, Inhale 2 puffs into the lungs every 4 (four) hours as needed for wheezing or shortness of breath., Disp: 8 g, Rfl: 0   benzonatate (TESSALON) 100 MG capsule, Take 1 capsule (100 mg total) by mouth 2 (two) times daily as needed for cough., Disp: 20 capsule, Rfl: 0   Spacer/Aero-Holding Chambers DEVI, 1 each by Does not apply route as needed., Disp: 1 each, Rfl: 0   FLUoxetine (PROZAC) 20 MG capsule, Take 3 capsules (60 mg total) by mouth daily. 60 mg q day, Disp: 270 capsule, Rfl: 1   modafinil (PROVIGIL) 200 MG tablet, Take 1 tablet (200 mg total) by mouth daily., Disp: 90 tablet, Rfl: 1   Medications ordered in this encounter:  Meds ordered this encounter  Medications   albuterol (VENTOLIN HFA) 108 (90 Base) MCG/ACT inhaler    Sig: Inhale 2 puffs into the lungs every 4 (four) hours as needed for wheezing or shortness of breath.    Dispense:  8 g    Refill:  0   Spacer/Aero-Holding Chambers DEVI    Sig: 1 each by Does not apply route as needed.    Dispense:  1 each    Refill:  0   benzonatate (TESSALON) 100 MG capsule    Sig: Take 1 capsule (100 mg total) by mouth 2 (two) times daily as needed for cough.    Dispense:  20 capsule    Refill:  0     *If you need refills on other medications prior to your next appointment, please contact your pharmacy*  Follow-Up: Call back or seek an in-person evaluation if the symptoms worsen or if the condition fails to improve as  anticipated.  Other Instructions You can use the albuterol inhaler with a spacer every 4-6 hours as needed for shortness of breath or wheezing.  If this does not help improve your cough or you feel your shortness of breath is getting progressively worse, or if you note any worsening of symptoms, any significant shortness of breath or any chest pain, please seek ER evaluation ASAP.  Please do not delay care!  COVID-19: What to Do if You Are Sick If you test positive and are an older adult or someone who is at high risk of getting very sick from COVID-19, treatment may be available. Contact a healthcare provider right away after a positive test to determine if you are eligible, even if your symptoms are mild right now. You can also visit a Test to Treat location and, if eligible, receive a prescription from a provider. Don't delay: Treatment must be started within the first few days to be effective. If you have a fever, cough, or other symptoms, you might have COVID-19. Most people have mild illness and are able to recover at home. If you are sick: Keep track of your symptoms. If you have an emergency warning sign (including trouble breathing), call 911. Steps to help prevent the spread of COVID-19 if you are sick If you are sick with COVID-19 or  think you might have COVID-19, follow the steps below to care for yourself and to help protect other people in your home and community. Stay home except to get medical care Stay home. Most people with COVID-19 have mild illness and can recover at home without medical care. Do not leave your home, except to get medical care. Do not visit public areas and do not go to places where you are unable to wear a mask. Take care of yourself. Get rest and stay hydrated. Take over-the-counter medicines, such as acetaminophen, to help you feel better. Stay in touch with your doctor. Call before you get medical care. Be sure to get care if you have trouble breathing, or have  any other emergency warning signs, or if you think it is an emergency. Avoid public transportation, ride-sharing, or taxis if possible. Get tested If you have symptoms of COVID-19, get tested. While waiting for test results, stay away from others, including staying apart from those living in your household. Get tested as soon as possible after your symptoms start. Treatments may be available for people with COVID-19 who are at risk for becoming very sick. Don't delay: Treatment must be started early to be effective--some treatments must begin within 5 days of your first symptoms. Contact your healthcare provider right away if your test result is positive to determine if you are eligible. Self-tests are one of several options for testing for the virus that causes COVID-19 and may be more convenient than laboratory-based tests and point-of-care tests. Ask your healthcare provider or your local health department if you need help interpreting your test results. You can visit your state, tribal, local, and territorial health department's website to look for the latest local information on testing sites. Separate yourself from other people As much as possible, stay in a specific room and away from other people and pets in your home. If possible, you should use a separate bathroom. If you need to be around other people or animals in or outside of the home, wear a well-fitting mask. Tell your close contacts that they may have been exposed to COVID-19. An infected person can spread COVID-19 starting 48 hours (or 2 days) before the person has any symptoms or tests positive. By letting your close contacts know they may have been exposed to COVID-19, you are helping to protect everyone. See COVID-19 and Animals if you have questions about pets. If you are diagnosed with COVID-19, someone from the health department may call you. Answer the call to slow the spread. Monitor your symptoms Symptoms of COVID-19 include  fever, cough, or other symptoms. Follow care instructions from your healthcare provider and local health department. Your local health authorities may give instructions on checking your symptoms and reporting information. When to seek emergency medical attention Look for emergency warning signs* for COVID-19. If someone is showing any of these signs, seek emergency medical care immediately: Trouble breathing Persistent pain or pressure in the chest New confusion Inability to wake or stay awake Pale, gray, or blue-colored skin, lips, or nail beds, depending on skin tone *This list is not all possible symptoms. Please call your medical provider for any other symptoms that are severe or concerning to you. Call 911 or call ahead to your local emergency facility: Notify the operator that you are seeking care for someone who has or may have COVID-19. Call ahead before visiting your doctor Call ahead. Many medical visits for routine care are being postponed or done by phone or telemedicine.  If you have a medical appointment that cannot be postponed, call your doctor's office, and tell them you have or may have COVID-19. This will help the office protect themselves and other patients. If you are sick, wear a well-fitting mask You should wear a mask if you must be around other people or animals, including pets (even at home). Wear a mask with the best fit, protection, and comfort for you. You don't need to wear the mask if you are alone. If you can't put on a mask (because of trouble breathing, for example), cover your coughs and sneezes in some other way. Try to stay at least 6 feet away from other people. This will help protect the people around you. Masks should not be placed on young children under age 24 years, anyone who has trouble breathing, or anyone who is not able to remove the mask without help. Cover your coughs and sneezes Cover your mouth and nose with a tissue when you cough or  sneeze. Throw away used tissues in a lined trash can. Immediately wash your hands with soap and water for at least 20 seconds. If soap and water are not available, clean your hands with an alcohol-based hand sanitizer that contains at least 60% alcohol. Clean your hands often Wash your hands often with soap and water for at least 20 seconds. This is especially important after blowing your nose, coughing, or sneezing; going to the bathroom; and before eating or preparing food. Use hand sanitizer if soap and water are not available. Use an alcohol-based hand sanitizer with at least 60% alcohol, covering all surfaces of your hands and rubbing them together until they feel dry. Soap and water are the best option, especially if hands are visibly dirty. Avoid touching your eyes, nose, and mouth with unwashed hands. Handwashing Tips Avoid sharing personal household items Do not share dishes, drinking glasses, cups, eating utensils, towels, or bedding with other people in your home. Wash these items thoroughly after using them with soap and water or put in the dishwasher. Clean surfaces in your home regularly Clean and disinfect high-touch surfaces (for example, doorknobs, tables, handles, light switches, and countertops) in your "sick room" and bathroom. In shared spaces, you should clean and disinfect surfaces and items after each use by the person who is ill. If you are sick and cannot clean, a caregiver or other person should only clean and disinfect the area around you (such as your bedroom and bathroom) on an as needed basis. Your caregiver/other person should wait as long as possible (at least several hours) and wear a mask before entering, cleaning, and disinfecting shared spaces that you use. Clean and disinfect areas that may have blood, stool, or body fluids on them. Use household cleaners and disinfectants. Clean visible dirty surfaces with household cleaners containing soap or detergent. Then,  use a household disinfectant. Use a product from [employer] List N: Disinfectants for Coronavirus (COVID-19). Be sure to follow the instructions on the label to ensure safe and effective use of the product. Many products recommend keeping the surface wet with a disinfectant for a certain period of time (look at "contact time" on the product label). You may also need to wear personal protective equipment, such as gloves, depending on the directions on the product label. Immediately after disinfecting, wash your hands with soap and water for 20 seconds. For completed guidance on cleaning and disinfecting your home, visit Complete Disinfection Guidance. Take steps to improve ventilation at home Improve ventilation (  air flow) at home to help prevent from spreading COVID-19 to other people in your household. Clear out COVID-19 virus particles in the air by opening windows, using air filters, and turning on fans in your home. Use this interactive tool to learn how to improve air flow in your home. When you can be around others after being sick with COVID-19 Deciding when you can be around others is different for different situations. Find out when you can safely end home isolation. For any additional questions about your care, contact your healthcare provider or state or local health department. 11/02/2020 Content source: [facility] ([facility]), Division of Viral Diseases This information is not intended to replace advice given to you by your health care provider. Make sure you discuss any questions you have with your health care provider. Document Revised: 12/16/2020 Document Reviewed: 12/16/2020 Elsevier Patient Education  2022 [employer].      If you have been instructed to have an in-person evaluation today at a local Urgent Care facility, please use the link below. It will take you to a list of all of our available [facility], including  address, phone number and hours of operation. Please do not delay care.  [facility]  If you or a family member do not have a primary care provider, use the link below to schedule a visit and establish care. When you choose a [facility] primary care physician or advanced practice provider, you gain a long-term partner in health. Find a Primary Care Provider  Learn more about [facility]'s in-office and virtual care options:  - Get Care Now

## 2021-07-11 ENCOUNTER — Telehealth: Payer: Managed Care, Other (non HMO) | Admitting: Emergency Medicine

## 2021-07-11 DIAGNOSIS — R052 Subacute cough: Secondary | ICD-10-CM

## 2021-07-11 MED ORDER — AMOXICILLIN 500 MG PO CAPS: 500.0000 mg | ORAL_CAPSULE | Freq: Three times a day (TID) | ORAL | 0 refills | Status: AC

## 2021-07-11 MED ORDER — AMOXICILLIN 500 MG PO CAPS: 500.0000 mg | ORAL_CAPSULE | Freq: Three times a day (TID) | ORAL | 0 refills | Status: DC

## 2021-07-11 NOTE — Telephone Encounter (Signed)
LMTCB

## 2021-07-11 NOTE — Progress Notes (Signed)
Virtual Visit Consent   Becky Mccoy, you are scheduled for a virtual visit with a [facility] provider today.     Just as with appointments in the office, your consent must be obtained to participate.  Your consent will be active for this visit and any virtual visit you may have with one of our providers in the next 365 days.     If you have a MyChart account, a copy of this consent can be sent to you electronically.  All virtual visits are billed to your insurance company just like a traditional visit in the office.    As this is a virtual visit, video technology does not allow for your provider to perform a traditional examination.  This may limit your provider's ability to fully assess your condition.  If your provider identifies any concerns that need to be evaluated in person or the need to arrange testing (such as labs, EKG, etc.), we will make arrangements to do so.     Although advances in technology are sophisticated, we cannot ensure that it will always work on either your end or our end.  If the connection with a video visit is poor, the visit may have to be switched to a telephone visit.  With either a video or telephone visit, we are not always able to ensure that we have a secure connection.     I need to obtain your verbal consent now.   Are you willing to proceed with your visit today?    Becky Mccoy has provided verbal consent on 07/11/2021 for a virtual visit video.   Roxy Horseman, PA-C   Date: 07/11/2021 10:28 AM   Virtual Visit via Video Note   I, Roxy Horseman, connected with  Becky Mccoy  ([id number], 06/14/1986) on 07/11/21 at 10:30 AM EST by a video-enabled telemedicine application and verified that I am speaking with the correct person using two identifiers.  Location: Patient: Virtual Visit Location Patient: Home Provider: Virtual Visit Location Provider: Office/Clinic   I discussed the limitations of evaluation and management by  telemedicine and the availability of in person appointments. The patient expressed understanding and agreed to proceed.    History of Present Illness: Becky Mccoy is a 35 y.o. who identifies as a female who was assigned female at birth, and is being seen today for chief complaint of cough.  Had used an inhaler, but it hasn't helped. Reports wheezing and productive cough.  Tested positive for COVID 1 week ago.   HPI: HPI  Problems:  Patient Active Problem List   Diagnosis Date Noted   Depression, major, recurrent (HCC) 04/18/2021   Weight gain 04/18/2021   Breast feeding status of mother 10/27/2020   Cough 10/26/2020   Vaginal discharge 10/17/2020   Leg skin lesion, right 10/17/2020   Premature rupture of membranes (PROM) affecting fourth pregnancy 08/29/2019   Normal vaginal delivery 08/29/2019   Postpartum care following vaginal delivery 08/29/2019   Encounter for supervision of low-risk pregnancy, antepartum 01/09/2019   Hypercholesterolemia 06/18/2018   Nipple discharge 12/11/2016   Chest pain 12/11/2016   Health care maintenance 02/25/2015   Thyroid fullness 06/07/2014   OCD (obsessive compulsive disorder) 06/07/2014    Allergies:  Allergies  Allergen Reactions   Sulfa Antibiotics    Medications:  Current Outpatient Medications:    albuterol (VENTOLIN HFA) 108 (90 Base) MCG/ACT inhaler, Inhale 2 puffs into the lungs every 4 (four) hours as needed for wheezing or shortness of  breath., Disp: 8 g, Rfl: 0   benzonatate (TESSALON) 100 MG capsule, Take 1 capsule (100 mg total) by mouth 2 (two) times daily as needed for cough., Disp: 20 capsule, Rfl: 0   FLUoxetine (PROZAC) 20 MG capsule, Take 3 capsules (60 mg total) by mouth daily. 60 mg q day, Disp: 270 capsule, Rfl: 1   modafinil (PROVIGIL) 200 MG tablet, Take 1 tablet (200 mg total) by mouth daily., Disp: 90 tablet, Rfl: 1   Spacer/Aero-Holding Chambers DEVI, 1 each by Does not apply route as needed., Disp: 1 each, Rfl:  0  Observations/Objective: Patient is well-developed, well-nourished in no acute distress.  Resting comfortably at home.  Head is normocephalic, atraumatic.  No labored breathing.  Speech is clear and coherent with logical content.  Patient is alert and oriented at baseline.  Coughing during exam.  Assessment and Plan: 1. Subacute cough  Post covid cough, now productive x 1 week.  Will trial Rx of Amox.  Patient is breastfeeding.  Continue albuterol.  Use OTC delsym.  Follow Up Instructions: I discussed the assessment and treatment plan with the patient. The patient was provided an opportunity to ask questions and all were answered. The patient agreed with the plan and demonstrated an understanding of the instructions.  A copy of instructions were sent to the patient via MyChart unless otherwise noted below.     The patient was advised to call back or seek an in-person evaluation if the symptoms worsen or if the condition fails to improve as anticipated.  Time:  I spent 9  minutes with the patient via telehealth technology discussing the above problems/concerns.    Roxy Horseman, PA-C

## 2021-07-12 ENCOUNTER — Telehealth: Payer: Self-pay

## 2021-07-12 NOTE — Telephone Encounter (Signed)
LM for patient

## 2021-07-12 NOTE — Telephone Encounter (Signed)
I am going to cancel me appointment for tomorrow Thanks     [name], LPN 23 hours ago (12:54 PM)   LMTCB      Note    Becky Mccoy  P [facility] (supporting Dale Gilchrist, MD) Yesterday (4:19 AM)   [name], I have an appointment Wednesday, I feel horrible and was theres any way I can be seen today?  I have a really bad cough and green thick mucus coming up. I am also wheezy.

## 2021-07-13 ENCOUNTER — Ambulatory Visit: Payer: Managed Care, Other (non HMO) | Admitting: Internal Medicine

## 2021-08-03 ENCOUNTER — Ambulatory Visit: Payer: Managed Care, Other (non HMO) | Admitting: Gastroenterology

## 2021-09-10 ENCOUNTER — Ambulatory Visit
Admission: EM | Admit: 2021-09-10 | Discharge: 2021-09-10 | Disposition: A | Discharge status: Home / Self Care | Payer: Managed Care, Other (non HMO)

## 2021-09-10 ENCOUNTER — Other Ambulatory Visit: Payer: Self-pay

## 2021-09-10 DIAGNOSIS — S86811A Strain of other muscle(s) and tendon(s) at lower leg level, right leg, initial encounter: Secondary | ICD-10-CM | POA: Diagnosis not present

## 2021-09-10 NOTE — ED Provider Notes (Signed)
[facility]    CSN: [id number] Arrival date & time: 09/10/21  0800      History   Chief Complaint Chief Complaint  Patient presents with   Leg Pain    right    HPI Becky Mccoy is a 36 y.o. female.   HPI  36 year old female here for evaluation of right calf pain.  Patient reports that she developed right calf pain while at work yesterday approximately noon time.  She is unaware of any injury denies any swelling.  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  She was walking when she felt a burning pain in the calf and some associated numbness in the toes.  The pain is worse with weightbearing and ambulation and improves some with elevation.  She states that she took 800 mg of ibuprofen last night without any improvement of pain.  No chest pain, shortness of breath, no history of smoking, no history of blood clots, and no use of oral contraceptives noted.  Past Medical History:  Diagnosis Date   History of chicken pox    Hx: UTI (urinary tract infection)    Obsessive-compulsive and related disorder    OCD (obsessive compulsive disorder)     Patient Active Problem List   Diagnosis Date Noted   Depression, major, recurrent ([place]) 04/18/2021   Weight gain 04/18/2021   Breast feeding status of mother 10/27/2020   Cough 10/26/2020   Vaginal discharge 10/17/2020   Leg skin lesion, right 10/17/2020   Premature rupture of membranes (PROM) affecting fourth pregnancy 08/29/2019   Normal vaginal delivery 08/29/2019   Postpartum care following vaginal delivery 08/29/2019   Encounter for supervision of low-risk pregnancy, antepartum 01/09/2019   Hypercholesterolemia 06/18/2018   Nipple discharge 12/11/2016   Chest pain 12/11/2016   Health care maintenance 02/25/2015   Thyroid fullness 06/07/2014   OCD (obsessive compulsive disorder) 06/07/2014    Past Surgical History:  Procedure Laterality Date   WISDOM  TOOTH EXTRACTION      OB History     Gravida  4   Para  3   Term  3   Preterm      AB  1   Living  3      SAB  1   IAB      Ectopic      Multiple  0   Live Births  1            Home Medications    Prior to Admission medications   Medication Sig Start Date End Date Taking? Authorizing Provider  FLUoxetine (PROZAC) 20 MG capsule Take 3 capsules (60 mg total) by mouth daily. 60 mg q day 05/12/21  Yes Cottle, Billey Co., MD    Family History Family History  Problem Relation Age of Onset   Healthy Mother    Healthy Father    Cancer - Other Maternal Grandfather        stomach cancer   Cancer - Other Paternal Grandfather        Bone cancer    Social History Social History   Tobacco Use   Smoking status: Never   Smokeless tobacco: Never  Vaping Use   Vaping Use: Never used  Substance Use Topics   Alcohol use: No    Alcohol/week: 0.0 standard drinks   Drug use: No     Allergies   Sulfa antibiotics   Review  of Systems Review of Systems  Constitutional:  Negative for fever.  Respiratory:  Negative for shortness of breath.   Cardiovascular:  Negative for chest pain.  Musculoskeletal:  Positive for myalgias.  Skin:  Negative for color change.  Neurological:  Positive for numbness.  Hematological: Negative.   Psychiatric/Behavioral: Negative.      Physical Exam Triage Vital Signs ED Triage Vitals  Enc Vitals Group     BP 09/10/21 0811 135/88     Pulse Rate 09/10/21 0811 (!) 101     Resp 09/10/21 0811 18     Temp 09/10/21 0811 99.4 F (37.4 C)     Temp Source 09/10/21 0811 Oral     SpO2 09/10/21 0811 100 %     Weight 09/10/21 0810 170 lb (77.1 kg)     Height 09/10/21 0810 5\' 6"  (1.676 m)     Head Circumference --      Peak Flow --      Pain Score 09/10/21 0810 7     Pain Loc --      Pain Edu? --      Excl. in [place]? --    No data found.  Updated Vital Signs BP 135/88 (BP Location: Left Arm)    Pulse (!) 101    Temp 99.4 F (37.4  C) (Oral)    Resp 18    Ht 5\' 6"  (1.676 m)    Wt 170 lb (77.1 kg)    LMP 08/13/2021    SpO2 100%    BMI 27.44 kg/m   Visual Acuity Right Eye Distance:   Left Eye Distance:   Bilateral Distance:    Right Eye Near:   Left Eye Near:    Bilateral Near:     Physical Exam Vitals and nursing note reviewed.  Constitutional:      Appearance: Normal appearance.  HENT:     Head: Normocephalic and atraumatic.  Musculoskeletal:        General: Tenderness present. No swelling, deformity or signs of injury. Normal range of motion.  Skin:    General: Skin is warm and dry.     Capillary Refill: Capillary refill takes less than 2 seconds.  Neurological:     General: No focal deficit present.     Mental Status: She is alert and oriented to person, place, and time.     Sensory: No sensory deficit.     Motor: No weakness.  Psychiatric:        Mood and Affect: Mood normal.        Behavior: Behavior normal.        Thought Content: Thought content normal.        Judgment: Judgment normal.     UC Treatments / Results  Labs (all labs ordered are listed, but only abnormal results are displayed) Labs Reviewed - No data to display  EKG   Radiology No results found.  Procedures Procedures (including critical care time)  Medications Ordered in UC Medications - No data to display  Initial Impression / Assessment and Plan / UC Course  I have reviewed the triage vital signs and the nursing notes.  Pertinent labs & imaging results that were available during my care of the patient were reviewed by me and considered in my medical decision making (see chart for details).  36 year old female here for evaluation of right calf pain that started yesterday and continues.  As noted in the HPI, there is no known injury that the patient is aware  of.  She does not have a sedentary job as she works as a Psychologist, counselling in hospice.  The pain did begin at work while she was ambulating.  The pain increases  with ambulation, improves with rest and elevation.  She did take over-the-counter ibuprofen at a dose of 800 mg without any improvement of her pain per her report.  She does not smoke, does not have a history of blood clots, does not use oral contraceptives, and has no history of cancer.  No chest pain or shortness of breath noted.  She was evaluated by one of the nurse practitioners at her work who thought she needed to be evaluated.  On exam patient's calves are symmetrical and both measure 37 cm.  There is no erythema or ecchymosis noted.  The patient does have pain to palpation of the medial aspect of the gastrocnemius.  She also has pain with active and passive range of motion of both dorsiflexion and plantarflexion.  There is no palpable cord and patient does not have any vascular defects or varicosities noted.  I feel that the patient is suffering from a calf muscle strain and will treat her with ibuprofen and Tylenol combination, elevation, compression, and rest.  I have advised her that if her pain continues, worsens, she develops redness or swelling that she needs to return for reevaluation.  Work note provided.   Final Clinical Impressions(s) / UC Diagnoses   Final diagnoses:  Strain of calf muscle, right, initial encounter     Discharge Instructions      Elevate your right leg as much as possible to rest the calf muscle.  Take 600 mg Ibuprofen with 1000 mg Tylenol every 6 hours- take with food- for the pain.  Wear a compression sleeve on your calf to provide support.  You can also apply ice, or moist heat, for 20 minutes at a time as needed for pain.  If you develop increased pain, swelling of your leg, or redness please return for re-evaluation.     ED Prescriptions   None    PDMP not reviewed this encounter.   Margarette Canada, NP 09/10/21 [phone]

## 2021-09-10 NOTE — Discharge Instructions (Addendum)
Elevate your right leg as much as possible to rest the calf muscle.  Take 600 mg Ibuprofen with 1000 mg Tylenol every 6 hours- take with food- for the pain.  Wear a compression sleeve on your calf to provide support.  You can also apply ice, or moist heat, for 20 minutes at a time as needed for pain.  If you develop increased pain, swelling of your leg, or redness please return for re-evaluation.

## 2021-09-10 NOTE — ED Triage Notes (Signed)
Pt here with C/O right calf pain, started yesterday at work an has got worst. Feels numbness and burning in right foot at times.

## 2021-09-13 ENCOUNTER — Ambulatory Visit (INDEPENDENT_AMBULATORY_CARE_PROVIDER_SITE_OTHER)
Admit: 2021-09-13 | Discharge: 2021-09-13 | Disposition: A | Discharge status: Home / Self Care | Payer: Managed Care, Other (non HMO) | Attending: *Deleted | Admitting: *Deleted

## 2021-09-13 ENCOUNTER — Other Ambulatory Visit: Payer: Self-pay

## 2021-09-13 ENCOUNTER — Telehealth: Payer: Self-pay | Admitting: Emergency Medicine

## 2021-09-13 ENCOUNTER — Ambulatory Visit
Admission: EM | Admit: 2021-09-13 | Discharge: 2021-09-13 | Disposition: A | Discharge status: Home / Self Care | Payer: Managed Care, Other (non HMO) | Attending: Emergency Medicine | Admitting: Emergency Medicine

## 2021-09-13 DIAGNOSIS — M79661 Pain in right lower leg: Secondary | ICD-10-CM | POA: Diagnosis not present

## 2021-09-13 MED ORDER — PREDNISONE 20 MG PO TABS: 40.0000 mg | ORAL_TABLET | Freq: Every day | ORAL | 0 refills | Status: DC

## 2021-09-13 MED ORDER — CYCLOBENZAPRINE HCL 10 MG PO TABS: 10.0000 mg | ORAL_TABLET | Freq: Two times a day (BID) | ORAL | 0 refills | Status: DC | PRN

## 2021-09-13 NOTE — Telephone Encounter (Signed)
Notified patient of ultrasound results, negative for DVT, will move forward with treatment of prednisone course and muscle relaxers, discussed administration of medications, patient to follow-up with orthopedic specialist if symptoms continue to occur

## 2021-09-13 NOTE — ED Triage Notes (Signed)
Pt states that she was here earlier for a possible blood clot in the right leg. Pt was told to come back for a re-evaluation if it has not improved. Pt states that the pain has not gotten better and she has pain when walking and moving her foot. Pt states that Ibuprofen and tylenol are not working.

## 2021-09-13 NOTE — ED Provider Notes (Signed)
[facility]    CSN: [id number] Arrival date & time: 09/13/21  [id number]      History   Chief Complaint Chief Complaint  Patient presents with   Leg Pain    HPI KHALISHA GRAMAJO is a 36 y.o. female.   Patient presents with pain in the right calf described as burning and cramping for 5 days.  Associated numbness in the toes.  Does not radiate . symptoms remain constant but are worsened with walking with some improvement seen with elevation of leg.  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  Denies precipitating event, prior injury or trauma.  Has attempted use of Profen and Tylenol, ice and heat, stretching with no relief.  Denies discoloration of skin, swelling, shortness of breath, chest pain or tightness.  Non-smoker, not currently on birth control.  History of a uterine blood clot during pregnancy.    Past Medical History:  Diagnosis Date   History of chicken pox    Hx: UTI (urinary tract infection)    Obsessive-compulsive and related disorder    OCD (obsessive compulsive disorder)     Patient Active Problem List   Diagnosis Date Noted   Depression, major, recurrent ([place]) 04/18/2021   Weight gain 04/18/2021   Breast feeding status of mother 10/27/2020   Cough 10/26/2020   Vaginal discharge 10/17/2020   Leg skin lesion, right 10/17/2020   Premature rupture of membranes (PROM) affecting fourth pregnancy 08/29/2019   Normal vaginal delivery 08/29/2019   Postpartum care following vaginal delivery 08/29/2019   Encounter for supervision of low-risk pregnancy, antepartum 01/09/2019   Hypercholesterolemia 06/18/2018   Nipple discharge 12/11/2016   Chest pain 12/11/2016   Health care maintenance 02/25/2015   Thyroid fullness 06/07/2014   OCD (obsessive compulsive disorder) 06/07/2014    Past Surgical History:  Procedure Laterality Date   WISDOM TOOTH EXTRACTION      OB History     Gravida  4   Para  3   Term  3   Preterm      AB  1    Living  3      SAB  1   IAB      Ectopic      Multiple  0   Live Births  1            Home Medications    Prior to Admission medications   Medication Sig Start Date End Date Taking? Authorizing Provider  FLUoxetine (PROZAC) 20 MG capsule Take 3 capsules (60 mg total) by mouth daily. 60 mg q day 05/12/21  Yes Cottle, Billey Co., MD    Family History Family History  Problem Relation Age of Onset   Healthy Mother    Healthy Father    Cancer - Other Maternal Grandfather        stomach cancer   Cancer - Other Paternal Grandfather        Bone cancer    Social History Social History   Tobacco Use   Smoking status: Never   Smokeless tobacco: Never  Vaping Use   Vaping Use: Never used  Substance Use Topics   Alcohol use: No    Alcohol/week: 0.0 standard drinks   Drug use: No     Allergies   Sulfa antibiotics   Review of Systems Review of Systems  Constitutional: Negative.   Respiratory: Negative.    Cardiovascular: Negative.   Skin: Negative.   Neurological: Negative.  Physical Exam Triage Vital Signs ED Triage Vitals  Enc Vitals Group     BP 09/13/21 0828 (!) 120/91     Pulse Rate 09/13/21 0828 100     Resp 09/13/21 0828 18     Temp 09/13/21 0828 98.8 F (37.1 C)     Temp Source 09/13/21 0828 Oral     SpO2 09/13/21 0828 100 %     Weight 09/13/21 0827 170 lb (77.1 kg)     Height 09/13/21 0827 5\' 6"  (1.676 m)     Head Circumference --      Peak Flow --      Pain Score 09/13/21 0827 8     Pain Loc --      Pain Edu? --      Excl. in [place]? --    No data found.  Updated Vital Signs BP (!) 120/91 (BP Location: Left Arm)    Pulse 100    Temp 98.8 F (37.1 C) (Oral)    Resp 18    Ht 5\' 6"  (1.676 m)    Wt 170 lb (77.1 kg)    LMP 09/13/2021    SpO2 100%    BMI 27.44 kg/m   Visual Acuity Right Eye Distance:   Left Eye Distance:   Bilateral Distance:    Right Eye Near:   Left Eye Near:    Bilateral Near:     Physical  Exam Constitutional:      Appearance: Normal appearance. She is normal weight.  HENT:     Head: Normocephalic.  Eyes:     Extraocular Movements: Extraocular movements intact.  Pulmonary:     Effort: Pulmonary effort is normal.  Musculoskeletal:     Comments: Tenderness in the gastrocnemius muscle of the right leg, no swelling noted, no discoloration to the skin, no deformity  Skin:    General: Skin is warm and dry.  Neurological:     Mental Status: She is alert and oriented to person, place, and time. Mental status is at baseline.  Psychiatric:        Mood and Affect: Mood normal.        Behavior: Behavior normal.     UC Treatments / Results  Labs (all labs ordered are listed, but only abnormal results are displayed) Labs Reviewed - No data to display  EKG   Radiology No results found.  Procedures Procedures (including critical care time)  Medications Ordered in UC Medications - No data to display  Initial Impression / Assessment and Plan / UC Course  I have reviewed the triage vital signs and the nursing notes.  Pertinent labs & imaging results that were available during my care of the patient were reviewed by me and considered in my medical decision making (see chart for details).  Right calf pain  Patient seen in urgent care setting on 09/10/2021 for same symptoms, endorses that symptoms have not improved with recommended treatment but have not worsened, tenderness is still felt within the calf muscle without swelling or erythema , will complete ultrasound today to rule out DVT, results negative, notify patient via telephone, will move forward with treatment of calf strain, prednisone 40 mg burst for 5 days and Flexeril 10 mg twice daily as needed prescribed, RICE recommended heat in 15-minute intervals, daily stretching and pillows for support in addition to medication treatment, recommended follow-up with orthopedic specialist for persisting symptoms, walking referral  given Final Clinical Impressions(s) / UC Diagnoses   Final diagnoses:  None  Discharge Instructions   None    ED Prescriptions   None    PDMP not reviewed this encounter.   Hans Eden, NP 09/13/21 1158

## 2021-09-13 NOTE — Discharge Instructions (Signed)
Your appointment is at 1015 for the ultrasound of your leg Please return to this location and go into the main area of the building to check-in You may return home after your ultrasound and I will call you with test results and how we will move forward with treatment  If you have a blood clot you will be placed on a blood thinner and follow-up with your primary care doctor  If you do not have a blood clot then we will attempt use of a steroid course and muscle relaxers and have you follow-up with the orthopedic doctor  We will discuss the treatment plan in more detail once we have the finalized result

## 2021-10-17 ENCOUNTER — Encounter: Payer: Self-pay | Admitting: Internal Medicine

## 2021-10-17 ENCOUNTER — Ambulatory Visit (INDEPENDENT_AMBULATORY_CARE_PROVIDER_SITE_OTHER): Payer: Managed Care, Other (non HMO) | Admitting: Internal Medicine

## 2021-10-17 ENCOUNTER — Other Ambulatory Visit: Payer: Self-pay

## 2021-10-17 VITALS — BP 120/72 | HR 75 | Temp 97.2°F | Ht 66.0 in | Wt 172.0 lb

## 2021-10-17 DIAGNOSIS — E0789 Other specified disorders of thyroid: Secondary | ICD-10-CM

## 2021-10-17 DIAGNOSIS — R635 Abnormal weight gain: Secondary | ICD-10-CM

## 2021-10-17 DIAGNOSIS — Z0000 Encounter for general adult medical examination without abnormal findings: Secondary | ICD-10-CM | POA: Diagnosis not present

## 2021-10-17 DIAGNOSIS — F429 Obsessive-compulsive disorder, unspecified: Secondary | ICD-10-CM

## 2021-10-17 DIAGNOSIS — R5383 Other fatigue: Secondary | ICD-10-CM | POA: Diagnosis not present

## 2021-10-17 DIAGNOSIS — E781 Pure hyperglyceridemia: Secondary | ICD-10-CM | POA: Diagnosis not present

## 2021-10-17 DIAGNOSIS — F339 Major depressive disorder, recurrent, unspecified: Secondary | ICD-10-CM

## 2021-10-17 DIAGNOSIS — E7800 Pure hypercholesterolemia, unspecified: Secondary | ICD-10-CM

## 2021-10-17 LAB — LDL CHOLESTEROL, DIRECT: Direct LDL: 124 mg/dL

## 2021-10-17 LAB — COMPREHENSIVE METABOLIC PANEL
ALT: 17 U/L (ref 0–35)
AST: 16 U/L (ref 0–37)
Albumin: 4.4 g/dL (ref 3.5–5.2)
Alkaline Phosphatase: 56 U/L (ref 39–117)
BUN: 13 mg/dL (ref 6–23)
CO2: 27 mEq/L (ref 19–32)
Calcium: 9.1 mg/dL (ref 8.4–10.5)
Chloride: 105 mEq/L (ref 96–112)
Creatinine, Ser: 0.61 mg/dL (ref 0.40–1.20)
GFR: 116.29 mL/min (ref >60.00)
Glucose, Bld: 88 mg/dL (ref 70–99)
Potassium: 4.4 mEq/L (ref 3.5–5.1)
Sodium: 140 mEq/L (ref 135–145)
Total Bilirubin: 0.5 mg/dL (ref 0.2–1.2)
Total Protein: 6.9 g/dL (ref 6.0–8.3)

## 2021-10-17 LAB — LIPID PANEL
Cholesterol: 254 mg/dL — ABNORMAL HIGH (ref 0–200)
HDL: 35 mg/dL — ABNORMAL LOW (ref >39.00)
Total CHOL/HDL Ratio: 7
Triglycerides: 520 mg/dL — ABNORMAL HIGH (ref 0.0–149.0)

## 2021-10-17 LAB — TSH: TSH: 1.47 u[IU]/mL (ref 0.35–5.50)

## 2021-10-17 NOTE — Progress Notes (Signed)
Patient ID: Becky Mccoy, female   DOB: 06-12-1986, 36 y.o.   MRN: [id number]   Subjective:    Patient ID: Becky Mccoy, female    DOB: [birth year], 36 y.o.   MRN: [id number]  This visit occurred during the SARS-CoV-2 public health emergency.  Safety protocols were in place, including screening questions prior to the visit, additional usage of staff PPE, and extensive cleaning of exam room while observing appropriate contact time as indicated for disinfecting solutions.   Patient here for her physical exam.   Chief Complaint  Patient presents with   Annual Exam    CPE - no pap   .   HPI Here for physical exam.  She is seeing psychiatry and doing well with her current medication regimen.  Reports zoloft previously and prozac - make her gain weight.  Had questions about weight and weight loss.  Doing GOLO now.  Discussed diet and exercise.  Cost is an issue with GOLO.  Had questions about intermittent fasting.  No chest pain or sob reported.  No abdominal pain or bowel change reported.  COVID 06/2021.  No residual problems.  Previous calf pain.  Resolved.  Not a significant issue for her now.  Previous ultrasound negative for DVT.  Felt to be a muscle strain.  Has started exercising - walking.   Past Medical History:  Diagnosis Date   History of chicken pox    Hx: UTI (urinary tract infection)    Obsessive-compulsive and related disorder    OCD (obsessive compulsive disorder)    Past Surgical History:  Procedure Laterality Date   WISDOM TOOTH EXTRACTION     Family History  Problem Relation Age of Onset   Healthy Mother    Healthy Father    Cancer - Other Maternal Grandfather        stomach cancer   Cancer - Other Paternal Grandfather        Bone cancer   Social History   Socioeconomic History   Marital status: Married    Spouse name: Not on file   Number of children: Not on file   Years of education: Not on file   Highest education level: Not on file  Occupational  History   Not on file  Tobacco Use   Smoking status: Never   Smokeless tobacco: Never  Vaping Use   Vaping Use: Never used  Substance and Sexual Activity   Alcohol use: No    Alcohol/week: 0.0 standard drinks   Drug use: No   Sexual activity: Yes    Birth control/protection: None    Comment: Vasectomy  Other Topics Concern   Not on file  Social History Narrative   Not on file   Social Determinants of Health   Financial Resource Strain: Not on file  Food Insecurity: Not on file  Transportation Needs: Not on file  Physical Activity: Not on file  Stress: Not on file  Social Connections: Not on file     Review of Systems  Constitutional:  Negative for appetite change and unexpected weight change.  HENT:  Negative for congestion, sinus pressure and sore throat.   Eyes:  Negative for pain and visual disturbance.  Respiratory:  Negative for cough, chest tightness and shortness of breath.   Cardiovascular:  Negative for chest pain, palpitations and leg swelling.  Gastrointestinal:  Negative for abdominal pain, diarrhea, nausea and vomiting.  Genitourinary:  Negative for difficulty urinating and dysuria.  Musculoskeletal:  Negative for joint swelling and  myalgias.  Skin:  Negative for color change and rash.  Neurological:  Negative for dizziness, light-headedness and headaches.  Hematological:  Negative for adenopathy. Does not bruise/bleed easily.  Psychiatric/Behavioral:  Negative for agitation and dysphoric mood.       Objective:     BP 120/72 (BP Location: Left Arm, Patient Position: Sitting, Cuff Size: Small)    Pulse 75    Temp (!) 97.2 F (36.2 C) (Oral)    Ht $R'5\' 6"'Fb$  (1.676 m)    Wt 172 lb (78 kg)    SpO2 97%    BMI 27.76 kg/m  Wt Readings from Last 3 Encounters:  10/17/21 172 lb (78 kg)  09/13/21 170 lb (77.1 kg)  09/10/21 170 lb (77.1 kg)    Physical Exam Vitals reviewed.  Constitutional:      General: She is not in acute distress.    Appearance: Normal  appearance. She is well-developed.  HENT:     Head: Normocephalic and atraumatic.     Right Ear: External ear normal.     Left Ear: External ear normal.  Eyes:     General: No scleral icterus.       Right eye: No discharge.        Left eye: No discharge.     Conjunctiva/sclera: Conjunctivae normal.  Neck:     Thyroid: No thyromegaly.  Cardiovascular:     Rate and Rhythm: Normal rate and regular rhythm.  Pulmonary:     Effort: No tachypnea, accessory muscle usage or respiratory distress.     Breath sounds: Normal breath sounds. No decreased breath sounds or wheezing.  Chest:  Breasts:    Right: No inverted nipple, mass, nipple discharge or tenderness (no axillary adenopathy).     Left: No inverted nipple, mass, nipple discharge or tenderness (no axilarry adenopathy).  Abdominal:     General: Bowel sounds are normal.     Palpations: Abdomen is soft.     Tenderness: There is no abdominal tenderness.  Musculoskeletal:        General: No swelling or tenderness.     Cervical back: Neck supple.  Lymphadenopathy:     Cervical: No cervical adenopathy.  Skin:    Findings: No erythema or rash.  Neurological:     Mental Status: She is alert and oriented to person, place, and time.  Psychiatric:        Mood and Affect: Mood normal.        Behavior: Behavior normal.     Outpatient Encounter Medications as of 10/17/2021  Medication Sig   FLUoxetine (PROZAC) 20 MG capsule Take 3 capsules (60 mg total) by mouth daily. 60 mg q day   [DISCONTINUED] cyclobenzaprine (FLEXERIL) 10 MG tablet Take 1 tablet (10 mg total) by mouth 2 (two) times daily as needed for muscle spasms. (Patient not taking: Reported on 10/17/2021)   [DISCONTINUED] predniSONE (DELTASONE) 20 MG tablet Take 2 tablets (40 mg total) by mouth daily. (Patient not taking: Reported on 10/17/2021)   No facility-administered encounter medications on file as of 10/17/2021.     Lab Results  Component Value Date   WBC 6.7 04/27/2021    HGB 14.6 04/27/2021   HCT 42.0 04/27/2021   PLT 347 04/27/2021   GLUCOSE 88 10/17/2021   CHOL 254 (H) 10/17/2021   TRIG (H) 10/17/2021    520.0 Triglyceride is over 400; calculations on Lipids are invalid.   HDL 35.00 (L) 10/17/2021   LDLDIRECT 124.0 10/17/2021   LDLCALC 152 (H)  09/19/2018   ALT 17 10/17/2021   AST 16 10/17/2021   NA 140 10/17/2021   K 4.4 10/17/2021   CL 105 10/17/2021   CREATININE 0.61 10/17/2021   BUN 13 10/17/2021   CO2 27 10/17/2021   TSH 1.47 10/17/2021    US Venous Img Lower Unilateral Right  Result Date: 09/13/2021 CLINICAL DATA:  Right calf pain EXAM: RIGHT LOWER EXTREMITY VENOUS DOPPLER ULTRASOUND TECHNIQUE: Gray-scale sonography with compression, as well as color and duplex ultrasound, were performed to evaluate the deep venous system(s) from the level of the common femoral vein through the popliteal and proximal calf veins. COMPARISON:  None. FINDINGS: VENOUS Normal compressibility of the common femoral, superficial femoral, and popliteal veins, as well as the visualized calf veins. Visualized portions of profunda femoral vein and great saphenous vein unremarkable. No filling defects to suggest DVT on grayscale or color Doppler imaging. Doppler waveforms show normal direction of venous flow, normal respiratory phasicity and response to augmentation. Limited views of the contralateral common femoral vein are unremarkable. OTHER None. Limitations: none IMPRESSION: No femoropopliteal DVT nor evidence of DVT within the visualized calf veins. If clinical symptoms are inconsistent or if there are persistent or worsening symptoms, further imaging (possibly involving the iliac veins) may be warranted. Electronically Signed   By: Lucrezia Europe M.D.   On: 09/13/2021 10:59       Assessment & Plan:   Problem List Items Addressed This Visit     Depression, major, recurrent ([place])    Seeing psychiatry. Doing better on current medication regimen.  Follow.  Continue f/u with  psychiatry.       Health care maintenance    Physical today 10/17/21.  PAP 10/12/20 - negative with negative HPV.       Hypercholesterolemia    Low cholesterol diet and exercise.  Follow lipid panel.        Relevant Orders   Lipid Profile (Completed)   Comp Met (CMET) (Completed)   OCD (obsessive compulsive disorder)    Seeing psychiatry.  Doing better on current medication regimen.  Follow.       Thyroid fullness    Previous thyroid ultrasound ok. Check tsh.       Relevant Orders   TSH (Completed)   Weight gain    She is concerned regarding weight gain.  Discussed.  Discussed diet and exercise. She had questions about intermittent fasting.  Check routine labs.  Follow.        Other Visit Diagnoses     Routine general medical examination at a health care facility    -  Primary   Fatigue, unspecified type            Einar Pheasant, MD

## 2021-10-17 NOTE — Assessment & Plan Note (Signed)
Physical today 10/17/21.  PAP 10/12/20 - negative with negative HPV.  ?

## 2021-10-18 ENCOUNTER — Encounter: Payer: Self-pay | Admitting: Internal Medicine

## 2021-10-18 NOTE — Assessment & Plan Note (Signed)
Seeing psychiatry.  Doing better on current medication regimen.  Follow.  

## 2021-10-18 NOTE — Assessment & Plan Note (Signed)
Previous thyroid ultrasound ok. Check tsh.  ?

## 2021-10-18 NOTE — Assessment & Plan Note (Signed)
Low cholesterol diet and exercise.  Follow lipid panel.   

## 2021-10-18 NOTE — Assessment & Plan Note (Signed)
She is concerned regarding weight gain.  Discussed.  Discussed diet and exercise. She had questions about intermittent fasting.  Check routine labs.  Follow.   ?

## 2021-10-18 NOTE — Assessment & Plan Note (Signed)
Seeing psychiatry. Doing better on current medication regimen.  Follow.  Continue f/u with psychiatry.  

## 2021-11-10 ENCOUNTER — Ambulatory Visit: Payer: 59 | Admitting: Psychiatry

## 2021-12-02 ENCOUNTER — Other Ambulatory Visit: Payer: Self-pay | Admitting: Psychiatry

## 2021-12-02 DIAGNOSIS — F429 Obsessive-compulsive disorder, unspecified: Secondary | ICD-10-CM

## 2021-12-02 NOTE — Telephone Encounter (Signed)
Patient has not been seen since 04/2021, was a no show in March. LVM to please schedule/or let us know if she is no longer following.  ?

## 2021-12-08 ENCOUNTER — Encounter: Payer: Self-pay | Admitting: Internal Medicine

## 2021-12-08 ENCOUNTER — Telehealth: Payer: Self-pay | Admitting: Internal Medicine

## 2021-12-08 ENCOUNTER — Telehealth (INDEPENDENT_AMBULATORY_CARE_PROVIDER_SITE_OTHER): Payer: Managed Care, Other (non HMO) | Admitting: Internal Medicine

## 2021-12-08 ENCOUNTER — Telehealth: Payer: Self-pay

## 2021-12-08 DIAGNOSIS — R509 Fever, unspecified: Secondary | ICD-10-CM

## 2021-12-08 DIAGNOSIS — Z20822 Contact with and (suspected) exposure to covid-19: Secondary | ICD-10-CM

## 2021-12-08 DIAGNOSIS — R112 Nausea with vomiting, unspecified: Secondary | ICD-10-CM

## 2021-12-08 DIAGNOSIS — R1084 Generalized abdominal pain: Secondary | ICD-10-CM

## 2021-12-08 DIAGNOSIS — M791 Myalgia, unspecified site: Secondary | ICD-10-CM

## 2021-12-08 DIAGNOSIS — R519 Headache, unspecified: Secondary | ICD-10-CM

## 2021-12-08 DIAGNOSIS — R197 Diarrhea, unspecified: Secondary | ICD-10-CM

## 2021-12-08 DIAGNOSIS — R531 Weakness: Secondary | ICD-10-CM

## 2021-12-08 DIAGNOSIS — E860 Dehydration: Secondary | ICD-10-CM

## 2021-12-08 MED ORDER — ONDANSETRON 4 MG PO TBDP: 4.0000 mg | ORAL_TABLET | Freq: Three times a day (TID) | ORAL | 0 refills | Status: DC | PRN

## 2021-12-08 NOTE — Patient Instructions (Addendum)
Probiotic (align or culturelle) ?Peptobismol  ?Gatorade zero or pedialyte popsicles or drink ?Hydrate with water  ?Broths  ?Bananas ?Rice  ?Applesauces  ?Toast ?Crackers  ?Ginger ale  ?Tylenol for fever ? ?Bland Diet ?A bland diet may consist of soft foods or foods that are not high in fat or are not greasy, acidic, or spicy. Avoiding certain foods may cause less irritation to your mouth, throat, stomach, or gastrointestinal tract. Avoiding certain foods may make you feel better. Everyone's tolerances are different. A bland diet should be based on what you can tolerate and what may cause discomfort. ?What is my plan? ?Your health care provider or dietitian may recommend specific changes to your diet to treat your symptoms. These changes may include: ?Eating small meals frequently. ?Cooking food until it is soft enough to chew easily. ?Taking the time to chew your food thoroughly, so it is easy to swallow and digest. ?Avoiding foods that cause you discomfort. These may include spicy food, fried food, greasy foods, hard-to-chew foods, or citrus fruits and juices. ?Drinking slowly. ?What are tips for following this plan? ?Reading food labels ?To reduce fiber intake, look for food labels that say "whole," such as whole wheat or whole grain. ?Shopping ?Avoid food items that may have nuts or seeds. ?Avoid vegetables that may make you gassy or have a tough texture, such as broccoli, cauliflower, or corn. ?Cooking ?Adriana Simas foods thoroughly so they have a soft texture. ?Meal planning ?Make sure you include foods from all food groups to eat a balanced diet. ?Eat a variety of types of foods. ?Eat foods and drink beverages that do not cause you discomfort. These may include soups and broths with cooked meats, pasta, and vegetables. ?Lifestyle ?Sit up after meals, avoid tight clothing, and take time to eat and chew your food slowly. ?Ask your health care provider whether you should take dietary supplements. ?General  information ?Mildly season your foods. Some seasonings, such as cayenne pepper, vinegar, or hot sauce, may cause irritation. ?The foods, beverages, or seasonings to avoid should be based on individual tolerance. ?What foods should I eat? ?Fruits ?Canned or cooked fruit such as peaches, pears, or applesauce. Bananas. ?Vegetables ?Well-cooked vegetables. Canned or cooked vegetables such as carrots, green beans, beets, or spinach. Mashed or boiled potatoes. ?Grains ? ?Hot cereals, such as cream of wheat and processed oatmeal. Rice. Bread, crackers, pasta, or tortillas made from refined white flour. ?Meats and other proteins ? ?Eggs. Creamy peanut butter or other nut butters. Lean, well-cooked tender meats, such as beef, pork, chicken, or fish. ?Dairy ?Low-fat dairy products such as milk, cottage cheese, or yogurt. ?Beverages ? ?Water. Herbal tea. Apple juice. ?Fats and oils ?Mild salad dressings. Canola or olive oil. ?Sweets and desserts ?Low-fat pudding, custard, or ice cream. Fruit gelatin. ?The items listed above may not be a complete list of foods and beverages you can eat. Contact a dietitian for more information. ?What foods should I avoid? ?Fruits ?Citrus fruits, such as oranges and grapefruit. Fruits with a stringy texture. Fruits that have lots of seeds, such as kiwi or strawberries. Dried fruits. ?Vegetables ?Raw, uncooked vegetables. Salads. ?Grains ?Whole grain breads, muffins, and cereals. ?Meats and other proteins ?Tough, fibrous meats. Highly seasoned meat such as corned beef, smoked meats, or fish. Processed high-fat meats such as brats, hot dogs, or sausage. ?Dairy ?Full-fat dairy foods such as ice cream and cheese. ?Beverages ?Caffeinated drinks. Alcohol. ?Seasonings and condiments ?Strongly flavored seasonings or condiments. Hot sauce. Salsa. ?Other foods ?Spicy foods.  Fried or greasy foods. Sour foods, such as pickled or fermented foods like sauerkraut. Foods high in fiber. ?The items listed above  may not be a complete list of foods and beverages you should avoid. Contact a dietitian for more information. ?Summary ?A bland diet should be based on individual tolerance. It may consist of foods that are soft textured and do not have a lot of fat, fiber, acid, or seasonings. ?A bland diet may be recommended because avoiding certain foods, beverages, or spices may make you feel better. ?This information is not intended to replace advice given to you by your health care provider. Make sure you discuss any questions you have with your health care provider. ?Document Revised: 06/20/2021 Document Reviewed: 06/20/2021 ?Elsevier Patient Education ? 2023 [employer] ? ? ?Diarrhea, Adult ?Diarrhea is frequent loose and watery bowel movements. Diarrhea can make you feel weak and cause you to become dehydrated. Dehydration can make you tired and thirsty, cause you to have a dry mouth, and decrease how often you urinate. ?Diarrhea typically lasts 2-3 days. However, it can last longer if it is a sign of something more serious. It is important to treat your diarrhea as told by your health care provider. ?Follow these instructions at home: ?Eating and drinking ? ?  ? ?Follow these recommendations as told by your health care provider: ?Take an oral rehydration solution (ORS). This is an over-the-counter medicine that helps return your body to its normal balance of nutrients and water. It is found at pharmacies and retail stores. ?Drink plenty of fluids, such as water, ice chips, diluted fruit juice, and low-calorie sports drinks. You can drink milk also, if desired. ?Avoid drinking fluids that contain a lot of sugar or caffeine, such as energy drinks, sports drinks, and soda. ?Eat bland, easy-to-digest foods in small amounts as you are able. These foods include bananas, applesauce, rice, lean meats, toast, and crackers. ?Avoid alcohol. ?Avoid spicy or fatty foods. ? ?Medicines ?Take over-the-counter and prescription medicines  only as told by your health care provider. ?If you were prescribed an antibiotic medicine, take it as told by your health care provider. Do not stop using the antibiotic even if you start to feel better. ?General instructions ? ?Wash your hands often using soap and water. If soap and water are not available, use a hand sanitizer. Others in the household should wash their hands as well. Hands should be washed: ?After using the toilet or changing a diaper. ?Before preparing, cooking, or serving food. ?While caring for a sick person or while visiting someone in a hospital. ?Drink enough fluid to keep your urine pale yellow. ?Rest at home while you recover. ?Watch your condition for any changes. ?Take a warm bath to relieve any burning or pain from frequent diarrhea episodes. ?Keep all follow-up visits as told by your health care provider. This is important. ?Contact a health care provider if: ?You have a fever. ?Your diarrhea gets worse. ?You have new symptoms. ?You cannot keep fluids down. ?You feel light-headed or dizzy. ?You have a headache. ?You have muscle cramps. ?Get help right away if: ?You have chest pain. ?You feel extremely weak or you faint. ?You have bloody or black stools or stools that look like tar. ?You have severe pain, cramping, or bloating in your abdomen. ?You have trouble breathing or you are breathing very quickly. ?Your heart is beating very quickly. ?Your skin feels cold and clammy. ?You feel confused. ?You have signs of dehydration, such as: ?Dark urine,  very little urine, or no urine. ?Cracked lips. ?Dry mouth. ?Sunken eyes. ?Sleepiness. ?Weakness. ?Summary ?Diarrhea is frequent loose and sometimes watery bowel movements. Diarrhea can make you feel weak and cause you to become dehydrated. ?Drink enough fluids to keep your urine pale yellow. ?Make sure that you wash your hands after using the toilet. If soap and water are not available, use hand sanitizer. ?Contact a health care provider if  your diarrhea gets worse or you have new symptoms. ?Get help right away if you have signs of dehydration. ?This information is not intended to replace advice given to you by your health care provider. Make sure you dis

## 2021-12-08 NOTE — Telephone Encounter (Signed)
This patient is sick and Im not sure what she has  ?Do you all want her to come in the back door do labs in the back room?  ?Also needs strep, covid and flu testing ?Blood and urine?  ?Appt 3 pm Friday  ? ? ?

## 2021-12-08 NOTE — Telephone Encounter (Signed)
LM for pt to cb re: needs appointment to be evaluated due to nausea, vomiting, and weakness. ? ?Openings with Dr. French Ana today. ?

## 2021-12-08 NOTE — Progress Notes (Addendum)
Telephone Note ? ?I connected with Becky Mccoy ? on 12/08/21 at  4:40 PM EDT telephone and verified that I am speaking with the correct person using two identifiers. ? Location patient: [place] ?Location provider:work or home office ?Persons participating in the virtual visit: patient, provider ? ?I discussed the limitations and requested verbal permission for telemedicine visit. The patient expressed understanding and agreed to proceed. ? ? ?HPI: ? ?Acute telemedicine visit for : ?Had mcdonalds yesterday and tuna last night with crackers c/o n/v/d 8-10 diarrhea dark loose/runny soft diarrheal episodes between last night and today. She has not been on Abx recently has 2 dogs but they are outside. Temp is 101.4 and only had 4 bites of jello today. At the beach 2.5[place]3 weeks ago she vomited as well and had sore throat due to forceful vomiting and vomiting at times blood. She is weak having body aches, feels like food is coming back up/regurgitation. She had h/a since last night at 2 am. Tried expired zofran w/o relief no tested for covid or had covid vaccine. She had covid about 8 months ago. No one else sick in the house with similar sx's. She has been like this for about 20 hours after speaking with pt and husband  ?She just had her menses  ? ?Ab soreness due to vomiting  ?-Pertinent past medical history: see below ?-Pertinent medication allergies: ?Allergies  ?Allergen Reactions  ? Sulfa Antibiotics   ? ?-COVID-19 vaccine status:  ?Immunization History  ?Administered Date(s) Administered  ? Influenza Split 05/18/2014  ? Influenza-Unspecified 05/15/2015, 06/25/2019  ? Tdap 07/23/2019  ? ? ? ?ROS: See pertinent positives and negatives per HPI. ? ?Past Medical History:  ?Diagnosis Date  ? COVID-19   ? 04/2021 11/202  ? History of chicken pox   ? Hx: UTI (urinary tract infection)   ? Obsessive-compulsive and related disorder   ? OCD (obsessive compulsive disorder)   ? ? ?Past Surgical History:  ?Procedure Laterality Date   ? WISDOM TOOTH EXTRACTION    ? ? ? ?Current Outpatient Medications:  ?  FLUoxetine (PROZAC) 20 MG capsule, TAKE 3 CAPSULES BY MOUTH ONCE DAILY, Disp: 180 capsule, Rfl: 0 ?  ondansetron (ZOFRAN-ODT) 4 MG disintegrating tablet, Take 1 tablet (4 mg total) by mouth every 8 (eight) hours as needed for nausea or vomiting., Disp: 40 tablet, Rfl: 0 ? ?EXAM: ? ?VITALS per patient if applicable: ? ?GENERAL: alert, oriented, appears well and in no acute distress ? ? ?PSYCH/NEURO: pleasant and cooperative, no obvious depression or anxiety, speech and thought processing grossly intact ? ?ASSESSMENT AND PLAN: ? ?Discussed the following assessment and plan: ??viral gastroenteritis vs other 04/2021 RUQ negative  ?Nausea and vomiting, diarrhea ?etiology- Plan: Comprehensive metabolic panel, CBC with Differential/Platelet, Urinalysis, Routine w reflex microscopic, Urine Culture, ondansetron (ZOFRAN-ODT) 4 MG disintegrating tablet tid prn ?Probiotic (align or culturelle) ?Peptobismol  ?Gatorade zero or pedialyte popsicles or drink ?Ok to try otc prilosec 20 mg qd for now ? ?Consider further imaging in the future after labs I.e ct scan vs stool studies c diff ? ?Consider GI consult  ?Hydrate with water  ?Broths  ?Bananas ?Rice  ?Applesauces  ?Toast ?Crackers  ?Ginger ale  ?Tylenol for fever ? ?Myalgia - Plan: POCT rapid strep A, POCT Influenza A/B, Novel Coronavirus, NAA ([facility]) ? ?Acute intractable headache, unspecified headache type - Plan: POCT rapid strep A, POCT Influenza A/B, Novel Coronavirus, NAA ([facility]) ?Prn tylenol  ? ?Generalized abdominal pain could be due to excessive vomiting and  diarrhea- Plan: Lipase, Urinalysis, Routine w reflex microscopic, Urine Culture ?Consider imaging in the future ? ?Exposure to COVID-19 virus - Plan: Novel Coronavirus, NAA ([facility]) ? ?Dehydration ?Weakness ?Fever, unspecified fever cause prn tylenol  ?If worse rec [facility] overnight  ?Will come 12/09/21 for labs and covid/flu/strep  testing ? ?-we discussed possible serious and likely etiologies, options for evaluation and workup, limitations of telemedicine visit vs in person visit, treatment, treatment risks and precautions. Pt is agreeable to treatment via telemedicine at this moment.  ?Work/School slipped offered: provided in patient instructions  yes ? ?I discussed the assessment and treatment plan with the patient. The patient was provided an opportunity to ask questions and all were answered. The patient agreed with the plan and demonstrated an understanding of the instructions. ?  ? ?Time spent 20 min ?Pasty Spillers McLean-Scocuzza, MD   ?

## 2021-12-09 ENCOUNTER — Other Ambulatory Visit: Payer: Self-pay

## 2021-12-12 ENCOUNTER — Ambulatory Visit (INDEPENDENT_AMBULATORY_CARE_PROVIDER_SITE_OTHER): Payer: BC Managed Care – PPO

## 2021-12-12 ENCOUNTER — Encounter: Payer: Self-pay | Admitting: Internal Medicine

## 2021-12-12 ENCOUNTER — Ambulatory Visit (INDEPENDENT_AMBULATORY_CARE_PROVIDER_SITE_OTHER): Payer: BC Managed Care – PPO | Admitting: Internal Medicine

## 2021-12-12 VITALS — BP 138/88 | HR 93 | Temp 98.8°F | Ht 66.0 in | Wt 166.8 lb

## 2021-12-12 DIAGNOSIS — R197 Diarrhea, unspecified: Secondary | ICD-10-CM

## 2021-12-12 DIAGNOSIS — R1013 Epigastric pain: Secondary | ICD-10-CM

## 2021-12-12 DIAGNOSIS — K529 Noninfective gastroenteritis and colitis, unspecified: Secondary | ICD-10-CM | POA: Diagnosis not present

## 2021-12-12 DIAGNOSIS — K5900 Constipation, unspecified: Secondary | ICD-10-CM | POA: Diagnosis not present

## 2021-12-12 LAB — COMPREHENSIVE METABOLIC PANEL
ALT: 29 U/L (ref 0–35)
AST: 26 U/L (ref 0–37)
Albumin: 4.6 g/dL (ref 3.5–5.2)
Alkaline Phosphatase: 74 U/L (ref 39–117)
BUN: 10 mg/dL (ref 6–23)
CO2: 25 mEq/L (ref 19–32)
Calcium: 8.6 mg/dL (ref 8.4–10.5)
Chloride: 105 mEq/L (ref 96–112)
Creatinine, Ser: 0.56 mg/dL (ref 0.40–1.20)
GFR: 118.58 mL/min (ref >60.00)
Glucose, Bld: 84 mg/dL (ref 70–99)
Potassium: 3.9 mEq/L (ref 3.5–5.1)
Sodium: 140 mEq/L (ref 135–145)
Total Bilirubin: 0.5 mg/dL (ref 0.2–1.2)
Total Protein: 7.2 g/dL (ref 6.0–8.3)

## 2021-12-12 LAB — CBC WITH DIFFERENTIAL/PLATELET
Basophils Absolute: 0 10*3/uL (ref 0.0–0.1)
Basophils Relative: 0.6 % (ref 0.0–3.0)
Eosinophils Absolute: 0.2 10*3/uL (ref 0.0–0.7)
Eosinophils Relative: 3.2 % (ref 0.0–5.0)
HCT: 41.4 % (ref 36.0–46.0)
Hemoglobin: 14.1 g/dL (ref 12.0–15.0)
Lymphocytes Relative: 34.7 % (ref 12.0–46.0)
Lymphs Abs: 2.5 10*3/uL (ref 0.7–4.0)
MCHC: 34.1 g/dL (ref 30.0–36.0)
MCV: 88.6 fl (ref 78.0–100.0)
Monocytes Absolute: 0.5 10*3/uL (ref 0.1–1.0)
Monocytes Relative: 6.6 % (ref 3.0–12.0)
Neutro Abs: 3.9 10*3/uL (ref 1.4–7.7)
Neutrophils Relative %: 54.9 % (ref 43.0–77.0)
Platelets: 329 10*3/uL (ref 150.0–400.0)
RBC: 4.67 Mil/uL (ref 3.87–5.11)
RDW: 12.8 % (ref 11.5–15.5)
WBC: 7.1 10*3/uL (ref 4.0–10.5)

## 2021-12-12 LAB — MAGNESIUM: Magnesium: 2.1 mg/dL (ref 1.5–2.5)

## 2021-12-12 LAB — LIPASE: Lipase: 46 U/L (ref 11.0–59.0)

## 2021-12-12 NOTE — Progress Notes (Signed)
? ?Subjective:  ?Patient ID: Becky Mccoy, female    DOB: 1986-01-30  Age: 36 y.o. MRN: [id number] ? ?CC: The primary encounter diagnosis was Diarrhea of presumed infectious origin. Diagnoses of Epigastric pain and Colitis were also pertinent to this visit. ? ? ?This visit occurred during the SARS-CoV-2 public health emergency.  Safety protocols were in place, including screening questions prior to the visit, additional usage of staff PPE, and extensive cleaning of exam room while observing appropriate contact time as indicated for disinfecting solutions.   ? ?HPI ?Becky Mccoy presents for  ?Chief Complaint  ?Patient presents with  ? Acute Visit  ?  Nauseated and bloated x 1 month  ? ? ?Seen via virtual visit April 27 by TMS ; workup V/D was ordered but not done prior to today's visit   ? ?Diarrhea of 3 days with fever , both  have since resolved,  no BM in 2 days .  Still having nausea and vomiting which has been intermittent for the past month.  Waking up in early morning with vomiting and diarrhea , not occurring daily . Has had infrequent solid stools.  No history of travel or camping., no recent antibiotics.   Has 2 outdoor dogs,  who have not been wormed recently. Dietary change with increased salads and berries since last lipid panel in March,  lost 10 lbs.  She has recently stopped eating lettuce due to listeria scare, but still eating berries.  Denies cramping , but has fecal urgency.   ? ?Nausea/vomiting:  occurring for the past month.  Husband had a vasectomy 7 months ago and she just finished a menses, so pregnancy unlikely.  None of the other 4 household members have been sick. No recent antibiotics  (over a year)   ? ?Taking omeprazole 20 mg daily for the past 4 days,  not helping.   ? ?Outpatient Medications Prior to Visit  ?Medication Sig Dispense Refill  ? FLUoxetine (PROZAC) 20 MG capsule TAKE 3 CAPSULES BY MOUTH ONCE DAILY 180 capsule 0  ? ondansetron (ZOFRAN-ODT) 4 MG disintegrating  tablet Take 1 tablet (4 mg total) by mouth every 8 (eight) hours as needed for nausea or vomiting. 40 tablet 0  ? ?No facility-administered medications prior to visit.  ? ? ?Review of Systems; ? ?Patient denies headache, fevers, malaise, unintentional weight loss, skin rash, eye pain, sinus congestion and sinus pain, sore throat, dysphagia,  hemoptysis , cough, dyspnea, wheezing, chest pain, palpitations, orthopnea, edema, abdominal pain, melena, constipation, flank pain, dysuria, hematuria, urinary  Frequency, nocturia, numbness, tingling, seizures,  Focal weakness, Loss of consciousness,  Tremor, insomnia, depression, anxiety, and suicidal ideation.   ? ? ? ?Objective:  ?BP 138/88 (BP Location: Left Arm, Patient Position: Sitting, Cuff Size: Normal)   Pulse 93   Temp 98.8 ?F (37.1 ?C) (Oral)   Ht 5\' 6"  (1.676 m)   Wt 166 lb 12.8 oz (75.7 kg)   LMP 12/04/2021 (Exact Date)   SpO2 96%   BMI 26.92 kg/m?  ? ?BP Readings from Last 3 Encounters:  ?12/12/21 138/88  ?10/17/21 120/72  ?09/13/21 (!) 120/91  ? ? ?Wt Readings from Last 3 Encounters:  ?12/12/21 166 lb 12.8 oz (75.7 kg)  ?10/17/21 172 lb (78 kg)  ?09/13/21 170 lb (77.1 kg)  ? ? ?General appearance: alert, cooperative and appears stated age ?Ears: normal TM's and external ear canals both ears ?Throat: lips, mucosa, and tongue normal; teeth and gums normal ?Neck: no adenopathy, no carotid  bruit, supple, symmetrical, trachea midline and thyroid not enlarged, symmetric, no tenderness/mass/nodules ?Back: symmetric, no curvature. ROM normal. No CVA tenderness. ?Lungs: clear to auscultation bilaterally ?Heart: regular rate and rhythm, S1, S2 normal, no murmur, click, rub or gallop ?Abdomen: soft, non-tender; no rebound or guarding,  bowel sounds normal; no masses,  no organomegaly ?Pulses: 2+ and symmetric ?Skin: Skin color, texture, turgor normal. No rashes or lesions ?Lymph nodes: Cervical, supraclavicular, and axillary nodes normal. ? ?No results found for:  HGBA1C ? ?Lab Results  ?Component Value Date  ? CREATININE 0.56 12/12/2021  ? CREATININE 0.61 10/17/2021  ? CREATININE 0.75 04/28/2021  ? ? ?Lab Results  ?Component Value Date  ? WBC 7.1 12/12/2021  ? HGB 14.1 12/12/2021  ? HCT 41.4 12/12/2021  ? PLT 329.0 12/12/2021  ? GLUCOSE 84 12/12/2021  ? CHOL 254 (H) 10/17/2021  ? TRIG (H) 10/17/2021  ?  520.0 Triglyceride is over 400; calculations on Lipids are invalid.  ? HDL 35.00 (L) 10/17/2021  ? LDLDIRECT 124.0 10/17/2021  ? LDLCALC 152 (H) 09/19/2018  ? ALT 29 12/12/2021  ? AST 26 12/12/2021  ? NA 140 12/12/2021  ? K 3.9 12/12/2021  ? CL 105 12/12/2021  ? CREATININE 0.56 12/12/2021  ? BUN 10 12/12/2021  ? CO2 25 12/12/2021  ? TSH 1.47 10/17/2021  ? ? ?US Venous Img Lower Unilateral Right ? ?Result Date: 09/13/2021 ?CLINICAL DATA:  Right calf pain EXAM: RIGHT LOWER EXTREMITY VENOUS DOPPLER ULTRASOUND TECHNIQUE: Gray-scale sonography with compression, as well as color and duplex ultrasound, were performed to evaluate the deep venous system(s) from the level of the common femoral vein through the popliteal and proximal calf veins. COMPARISON:  None. FINDINGS: VENOUS Normal compressibility of the common femoral, superficial femoral, and popliteal veins, as well as the visualized calf veins. Visualized portions of profunda femoral vein and great saphenous vein unremarkable. No filling defects to suggest DVT on grayscale or color Doppler imaging. Doppler waveforms show normal direction of venous flow, normal respiratory phasicity and response to augmentation. Limited views of the contralateral common femoral vein are unremarkable. OTHER None. Limitations: none IMPRESSION: No femoropopliteal DVT nor evidence of DVT within the visualized calf veins. If clinical symptoms are inconsistent or if there are persistent or worsening symptoms, further imaging (possibly involving the iliac veins) may be warranted. Electronically Signed   By: Corlis Leak M.D.   On: 09/13/2021 10:59   ? ? ?Assessment & Plan:  ? ?Problem List Items Addressed This Visit   ? ? Colitis  ?  Symptoms of diarrhea, nausea , bloating and vomiting of unclear etiology .  ddx included microscopic colitis, infectious colitis,  Food intolerances,  Parasitic infections.  Stool studies,  CBC, CMEt ordered.  May need CT  ? ?  ?  ? ?Other Visit Diagnoses   ? ? Diarrhea of presumed infectious origin    -  Primary  ? Relevant Orders  ? GI pathogen panel by PCR, stool  ? C Difficile Quick Screen w PCR reflex  ? Fecal fat, qualitative  ? Comprehensive metabolic panel (Completed)  ? CBC with Differential/Platelet (Completed)  ? Magnesium (Completed)  ? DG Abd 1 View  ? Ova and parasite examination  ? Epigastric pain      ? Relevant Orders  ? Lipase (Completed)  ? H Pylori, IGM, IGG, IGA AB  ? DG Abd 1 View  ? Ova and parasite examination  ? ?  ? ? ?Follow-up: No follow-ups on file. ? ? ?  Crecencio Mc, MD ?

## 2021-12-12 NOTE — Assessment & Plan Note (Signed)
Symptoms of diarrhea, nausea , bloating and vomiting of unclear etiology .  ddx included microscopic colitis, infectious colitis,  Food intolerances,  Parasitic infections.  Stool studies,  CBC, CMEt ordered.  May need CT  ?

## 2021-12-12 NOTE — Telephone Encounter (Signed)
FYI 1: 15 appointment with you today.Patient could not come in for testing on Friday, patient says she feels like this is Acid reflux related having she feels food coming up in the back of her throat and then becomes nauseous , no fever , chills or body aches reported. Patient had video visit with provider on 12/09/21.  ?

## 2021-12-12 NOTE — Patient Instructions (Addendum)
Continue omeprazole for now and use the Zofran (odansetron ) as needed for nausea  ? ?Please return the stool samples to [facility] lab (via the [facility] ) ASAP so we can rule out various infections of the GI tract ? ? ?Avoid fresh fruit for now.  Stick to the [employer] that Dr Aundra Dubin gave you.  ?

## 2021-12-13 ENCOUNTER — Emergency Department
Admission: EM | Admit: 2021-12-13 | Discharge: 2021-12-13 | Disposition: A | Discharge status: Home / Self Care | Payer: BC Managed Care – PPO | Attending: Emergency Medicine | Admitting: Emergency Medicine

## 2021-12-13 ENCOUNTER — Telehealth: Payer: Self-pay | Admitting: Internal Medicine

## 2021-12-13 ENCOUNTER — Other Ambulatory Visit: Payer: Self-pay

## 2021-12-13 ENCOUNTER — Emergency Department: Payer: BC Managed Care – PPO

## 2021-12-13 ENCOUNTER — Encounter: Payer: Self-pay | Admitting: Emergency Medicine

## 2021-12-13 DIAGNOSIS — R109 Unspecified abdominal pain: Secondary | ICD-10-CM | POA: Diagnosis not present

## 2021-12-13 DIAGNOSIS — K529 Noninfective gastroenteritis and colitis, unspecified: Secondary | ICD-10-CM | POA: Diagnosis not present

## 2021-12-13 DIAGNOSIS — D582 Other hemoglobinopathies: Secondary | ICD-10-CM | POA: Diagnosis not present

## 2021-12-13 DIAGNOSIS — D72829 Elevated white blood cell count, unspecified: Secondary | ICD-10-CM | POA: Diagnosis not present

## 2021-12-13 DIAGNOSIS — Z8616 Personal history of COVID-19: Secondary | ICD-10-CM | POA: Insufficient documentation

## 2021-12-13 DIAGNOSIS — N281 Cyst of kidney, acquired: Secondary | ICD-10-CM | POA: Diagnosis not present

## 2021-12-13 DIAGNOSIS — K760 Fatty (change of) liver, not elsewhere classified: Secondary | ICD-10-CM | POA: Insufficient documentation

## 2021-12-13 LAB — COMPREHENSIVE METABOLIC PANEL
ALT: 39 U/L (ref 0–44)
AST: 33 U/L (ref 15–41)
Albumin: 4.3 g/dL (ref 3.5–5.0)
Alkaline Phosphatase: 74 U/L (ref 38–126)
Anion gap: 9 (ref 5–15)
BUN: 13 mg/dL (ref 6–20)
CO2: 23 mmol/L (ref 22–32)
Calcium: 8.8 mg/dL — ABNORMAL LOW (ref 8.9–10.3)
Chloride: 107 mmol/L (ref 98–111)
Creatinine, Ser: 0.59 mg/dL (ref 0.44–1.00)
GFR, Estimated: >60 mL/min (ref >60)
Glucose, Bld: 134 mg/dL — ABNORMAL HIGH (ref 70–99)
Potassium: 3.9 mmol/L (ref 3.5–5.1)
Sodium: 139 mmol/L (ref 135–145)
Total Bilirubin: 0.7 mg/dL (ref 0.3–1.2)
Total Protein: 7.6 g/dL (ref 6.5–8.1)

## 2021-12-13 LAB — CBC
HCT: 47.1 % — ABNORMAL HIGH (ref 36.0–46.0)
Hemoglobin: 15.4 g/dL — ABNORMAL HIGH (ref 12.0–15.0)
MCH: 29.1 pg (ref 26.0–34.0)
MCHC: 32.7 g/dL (ref 30.0–36.0)
MCV: 89 fL (ref 80.0–100.0)
Platelets: 397 10*3/uL (ref 150–400)
RBC: 5.29 MIL/uL — ABNORMAL HIGH (ref 3.87–5.11)
RDW: 12.5 % (ref 11.5–15.5)
WBC: 14.8 10*3/uL — ABNORMAL HIGH (ref 4.0–10.5)
nRBC: 0 % (ref 0.0–0.2)

## 2021-12-13 LAB — URINALYSIS, ROUTINE W REFLEX MICROSCOPIC
Bacteria, UA: NONE SEEN
Bilirubin Urine: NEGATIVE
Glucose, UA: NEGATIVE mg/dL
Ketones, ur: NEGATIVE mg/dL
Leukocytes,Ua: NEGATIVE
Nitrite: NEGATIVE
Protein, ur: NEGATIVE mg/dL
Specific Gravity, Urine: 1.02 (ref 1.005–1.030)
pH: 5 (ref 5.0–8.0)

## 2021-12-13 LAB — PREGNANCY, URINE: Preg Test, Ur: NEGATIVE

## 2021-12-13 LAB — LIPASE, BLOOD: Lipase: 41 U/L (ref 11–51)

## 2021-12-13 MED ORDER — DICYCLOMINE HCL 10 MG PO CAPS
10.0000 mg | ORAL_CAPSULE | Freq: Once | ORAL | Status: AC
Start: 2021-12-13 — End: 2021-12-13
  Administered 2021-12-13: 10 mg via ORAL
  Filled 2021-12-13: qty 1

## 2021-12-13 MED ORDER — DICYCLOMINE HCL 10 MG PO CAPS: 10.0000 mg | ORAL_CAPSULE | Freq: Three times a day (TID) | ORAL | 0 refills | Status: DC

## 2021-12-13 MED ORDER — IOHEXOL 300 MG/ML  SOLN
100.0000 mL | Freq: Once | INTRAMUSCULAR | Status: AC | PRN
  Administered 2021-12-13: 100 mL via INTRAVENOUS
  Filled 2021-12-13: qty 100

## 2021-12-13 NOTE — Telephone Encounter (Signed)
Patient called back and is complaining of being in a lot of stomach pain, blood in stool and diarrhea. Patient was transferred to Access Nurse. ?

## 2021-12-13 NOTE — ED Triage Notes (Signed)
Pt here with abd pain and diarrhea that started a month ago, pt noticed blood today. Pt having cramps in her back as well. Pain is local lower abd.  ?

## 2021-12-13 NOTE — Telephone Encounter (Signed)
Patient was seen yesterday by Dr Derrel Nip. She now has blood and diaherrea in stool, stomach is hurting. She was suppose to do a stool sample, but not kit was given. Patient coming into the office to pick one up today. ?

## 2021-12-13 NOTE — Telephone Encounter (Signed)
Spoke with pt to follow up and see if she was going to the ED. Pt stated that the pain has stopped since she is has been sitting down. She stated that there was just blood in the stool one time. She described the abdominal pain as a cramping pain all over when she has a bowel movement. Pt stated that she is debating on going to the ED.  ?

## 2021-12-13 NOTE — ED Notes (Signed)
See  triage note  presents with some abd pain and diarrhea    states she has had sx's for about 1 month  states she felt better then sx's returned  denies any fever ?

## 2021-12-13 NOTE — Discharge Instructions (Addendum)
Your CT today showed: ?FINDINGS: ?Lower Chest: No acute findings. ?Hepatobiliary: No hepatic masses identified. Mild-to-moderate ?diffuse hepatic steatosis. Gallbladder is unremarkable. No evidence ?of biliary ductal dilatation. ?Pancreas:  No mass or inflammatory changes. ?Spleen: Within normal limits in size and appearance. ?Adrenals/Urinary Tract: No masses identified. Small benign-appearing ?bilateral renal cysts noted (no followup imaging is recommended). No ? ?Please note you are also noted to have slightly fatty liver on your CT.  If you are not heavy drinker this is most likely diet related and you can work with your PCP to reduce any other changes to her liver from fatty diet. ?evidence of ureteral calculi or hydronephrosis. ?Stomach/Bowel: Colon is nondistended which limits evaluation, but ?mild long segment wall thickening is suspected involving the ?rectosigmoid colon. This is suspicious for proctocolitis. No ?evidence of bowel obstruction. No other inflammatory process or ?abnormal fluid collections identified. ?Vascular/Lymphatic: No pathologically enlarged lymph nodes. No acute ?vascular findings. ?Reproductive:  No mass or other significant abnormality. ?  ?Other:  None. ?  ?Musculoskeletal:  No suspicious bone lesions identified. ?  ?IMPRESSION: ?Suspect mild proctocolitis. ?Hepatic steatosis. ?

## 2021-12-13 NOTE — ED Provider Notes (Signed)
? ?[facility] ?Provider Note ? ? ? Event Date/Time  ? First MD Initiated Contact with Patient 12/13/21 1455   ?  (approximate) ? ? ?History  ? ?Abdominal Pain and Diarrhea ? ? ?HPI ? ?Becky Mccoy is a 36 y.o. female with past medical history of OCD who presents for evaluation of 1 month of waning and waxing GI symptoms including nausea and vomiting, abdominal pain and diarrhea.  Patient states last week she had an episode where he felt particular severe denies fevers couple days.  She states that she has had vomiting intermittently throughout the month.  She states he has noticed last couple days some bright red blood mixed with diarrhea.  No headache or earache, sore throat, chest pain, cough, shortness of breath but she does feel like her abdominal crampiness is rating to her back.  Denies any urinary symptoms.  No recent travel outside [place] or sick contacts.  She saw her PCP yesterday who had a KUB obtained that was read as possibly showing some constipation and an H. pylori test was sent.  She denies any clear leaving aggravating factors.  No other acute concerns at this time. ? ?  ?Past Medical History:  ?Diagnosis Date  ? COVID-19   ? 04/2021 11/202  ? History of chicken pox   ? Hx: UTI (urinary tract infection)   ? Obsessive-compulsive and related disorder   ? OCD (obsessive compulsive disorder)   ? ? ? ?Physical Exam  ?Triage Vital Signs: ?ED Triage Vitals [12/13/21 1348]  ?Enc Vitals Group  ?   BP (!) 152/84  ?   Pulse Rate (!) 108  ?   Resp 18  ?   Temp 98.7 ?F (37.1 ?C)  ?   Temp Source Oral  ?   SpO2 98 %  ?   Weight 165 lb (74.8 kg)  ?   Height 5\' 6"  (1.676 m)  ?   Head Circumference   ?   Peak Flow   ?   Pain Score 8  ?   Pain Loc   ?   Pain Edu?   ?   Excl. in GC?   ? ? ?Most recent vital signs: ?Vitals:  ? 12/13/21 1348 12/13/21 1455  ?BP: (!) 152/84 (!) 150/80  ?Pulse: (!) 108 100  ?Resp: 18 18  ?Temp: 98.7 ?F (37.1 ?C)   ?SpO2: 98% 98%  ? ? ?General: Awake, no  distress.  ?CV:  Good peripheral perfusion.  2+ radial pulse. ?Resp:  Normal effort.  Clear bilaterally. ?Abd:  No distention.  Mildly tender throughout.  No CVA tenderness.  No rigidity or guarding. ?Other:   ? ? ?ED Results / Procedures / Treatments  ?Labs ?(all labs ordered are listed, but only abnormal results are displayed) ?Labs Reviewed  ?COMPREHENSIVE METABOLIC PANEL - Abnormal; Notable for the following components:  ?    Result Value  ? Glucose, Bld 134 (*)   ? Calcium 8.8 (*)   ? All other components within normal limits  ?CBC - Abnormal; Notable for the following components:  ? WBC 14.8 (*)   ? RBC 5.29 (*)   ? Hemoglobin 15.4 (*)   ? HCT 47.1 (*)   ? All other components within normal limits  ?URINALYSIS, ROUTINE W REFLEX MICROSCOPIC - Abnormal; Notable for the following components:  ? Color, Urine YELLOW (*)   ? APPearance CLEAR (*)   ? Hgb urine dipstick MODERATE (*)   ? All other  components within normal limits  ?GASTROINTESTINAL PANEL BY PCR, STOOL (REPLACES STOOL CULTURE)  ?C DIFFICILE QUICK SCREEN W PCR REFLEX    ?LIPASE, BLOOD  ?PREGNANCY, URINE  ? ? ? ?EKG ? ? ?RADIOLOGY ? ?CT abdomen pelvis on my interpretation shows no evidence of appendicitis, diverticulitis, kidney stone, perinephric stranding, pancreatitis cholecystitis does show some stranding in the distal colon.  I reviewed radiology interpretation and agree to findings of suspected mild proctocolitis and hepatic steatosis without other acute process.  Adnexa are unremarkable. ? ? ?PROCEDURES: ? ?Critical Care performed: No ? ?Procedures ? ? ? ?MEDICATIONS ORDERED IN ED: ?Medications  ?dicyclomine (BENTYL) capsule 10 mg (has no administration in time range)  ?iohexol (OMNIPAQUE) 300 MG/ML solution 100 mL (100 mLs Intravenous Contrast Given 12/13/21 1545)  ? ? ? ?IMPRESSION / MDM / ASSESSMENT AND PLAN / ED COURSE  ?I reviewed the triage vital signs and the nursing notes. ?             ?               ? ?Differential diagnosis includes, but  is not limited to gastroenteritis, diverticulitis, tendinitis, cystitis, inflammatory bowel disease, AVM, malignancy, pancreatitis or cholecystitis. ? ?Lipase unremarkable.  CMP without significant lecture metabolic derangements.  CBC shows WBC count 14.8 and hemoglobin of 15.4 and normal platelets.  Pregnancy test is negative.  UA has some hemoglobin but otherwise unremarkable. ? ?CT abdomen pelvis on my interpretation shows no evidence of appendicitis, diverticulitis, kidney stone, perinephric stranding, pancreatitis cholecystitis does show some stranding in the distal colon.  I reviewed radiology interpretation and agree to findings of suspected mild proctocolitis and hepatic steatosis without other acute process.  Adnexa are unremarkable. ? ?Overall unclear precise etiology for patient's symptoms as it seems to be going on for several weeks and both infectious and autoimmune etiologies are still within the differential.  She does not appear septic or toxic and is able to tolerate p.o.  I have low suspicion for immediate life-threatening process and I think she is appropriate for continued outpatient evaluation with GI.  Also advised her to follow-up with PCP to have GI pathogen panel sent and follow-up for H. pylori study obtained yesterday as well as have her blood pressure rechecked.  Rx written for Bentyl.  Discharged in stable condition.  Strict return precautions advised and discussed. ? ?  ? ? ?FINAL CLINICAL IMPRESSION(S) / ED DIAGNOSES  ? ?Final diagnoses:  ?Proctocolitis  ?Hepatic steatosis  ? ? ? ?Rx / DC Orders  ? ?ED Discharge Orders   ? ?      Ordered  ?  dicyclomine (BENTYL) 10 MG capsule  3 times daily before meals & bedtime       ? 12/13/21 1617  ? ?  ?  ? ?  ? ? ? ?Note:  This document was prepared using Dragon voice recognition software and may include unintentional dictation errors. ?  ?Gilles Chiquito, MD ?12/13/21 1619 ? ?

## 2021-12-13 NOTE — Telephone Encounter (Signed)
Spoke with pt and she stated that she is going to go to the ED because she went to the bathroom again and there was blood in her stool again.  ?

## 2021-12-15 LAB — H PYLORI, IGM, IGG, IGA AB
H pylori, IgM Abs: 22 units — ABNORMAL HIGH (ref 0.0–8.9)
H. pylori, IgA Abs: <9 units (ref 0.0–8.9)
H. pylori, IgG AbS: 0.05 Index Value (ref 0.00–0.79)

## 2021-12-16 ENCOUNTER — Other Ambulatory Visit: Payer: Self-pay

## 2021-12-16 ENCOUNTER — Telehealth: Payer: Self-pay

## 2021-12-16 DIAGNOSIS — R1013 Epigastric pain: Secondary | ICD-10-CM

## 2021-12-16 DIAGNOSIS — K529 Noninfective gastroenteritis and colitis, unspecified: Secondary | ICD-10-CM

## 2021-12-16 NOTE — Telephone Encounter (Signed)
Reviewed labs.  Given IgG and IgA is negative, I would hold on h.pylori treatment at this time.  If persistent symptoms, needs to be reevaluated.  Can also either schedule her for an H.pylori breath test or can refer to GI for further evaluation - to follow up on lab results.  ?

## 2021-12-16 NOTE — Telephone Encounter (Signed)
S/w pt -  pt prefers GI referral. ?Will placed for [facility].  ?

## 2021-12-22 ENCOUNTER — Encounter: Payer: Self-pay | Admitting: Gastroenterology

## 2021-12-22 ENCOUNTER — Ambulatory Visit (INDEPENDENT_AMBULATORY_CARE_PROVIDER_SITE_OTHER): Payer: BC Managed Care – PPO | Admitting: Gastroenterology

## 2021-12-22 VITALS — BP 123/80 | HR 90 | Temp 99.4°F | Ht 66.0 in | Wt 167.1 lb

## 2021-12-22 DIAGNOSIS — R197 Diarrhea, unspecified: Secondary | ICD-10-CM | POA: Diagnosis not present

## 2021-12-22 NOTE — Progress Notes (Signed)
?  ?Arlyss Repress, MD ?[address]  ?[address]  ?[place], [place] [address]  ?Main: [phone]  ?Fax: [phone] ? ? ? ?Gastroenterology Consultation ? ?Referring Provider:     Dale Annand[place], MD ?Primary Care Physician:  Dale [place], MD ?Primary Gastroenterologist:  Dr. Arlyss Repress ?Reason for Consultation:     Upper abdominal pain ?      ? HPI:   ?Becky Mccoy is a 36 y.o. female referred by Dr. Dale [place], MD  for consultation & management of upper abdominal pain.  Approximately 2 weeks ago, patient developed sudden onset of sharp, stabbing epigastric discomfort that was intermittent associated with nausea, abdominal bloating.  She went to the ER on 9/14, labs were unremarkable for acute pancreatitis, normal CMP, normal CBC.  She underwent right upper quadrant ultrasound to evaluate for cholelithiasis which was negative except for hepatic steatosis.  She was discharged on Protonix 40 mg daily.  Patient reports ongoing epigastric discomfort associated with abdominal bloating, nausea and loss of appetite.  She did have an episode of constipation for which she took senna and had some relief.  She is recently started on high-protein, low-carb diet for hyperlipidemia. ? ?She eats out about once a week ? ?Follow-up visit 12/22/2021 ?Patient is here with new onset of symptoms of lower abdominal pain associated with nausea, diarrhea.  Patient's symptoms originally started on 12/07/2021 after she had [employer], tuna salad with crackers that have resulted in nausea, vomiting and severe diarrhea about 8-10 times, fever to 101.4.  Patient was advised to undergo stool studies to rule out infection, which she did not.  She went to the ER on 5/2 with rectal bleeding along with other symptoms.  She underwent CT abdomen and pelvis which revealed thickening of the rectosigmoid colon suspicious for proctocolitis, labs were significant for leukocytosis, WBC count 14.8, pregnancy test negative, serum  lipase normal, she was discharged home on Bentyl.  Her PCP referred to GI for further evaluation.  Patient reports that she is somewhat feeling better, has not noticed any rectal bleeding since ER visit. ? ?Patient denies family history of GI malignancy, celiac disease ? ?NSAIDs: None ? ?Antiplts/Anticoagulants/Anti thrombotics: None ? ?GI Procedures: None ? ?Past Medical History:  ?Diagnosis Date  ? COVID-19   ? 04/2021 11/202  ? History of chicken pox   ? Hx: UTI (urinary tract infection)   ? Obsessive-compulsive and related disorder   ? OCD (obsessive compulsive disorder)   ? ? ?Past Surgical History:  ?Procedure Laterality Date  ? WISDOM TOOTH EXTRACTION    ? ? ? ? ?Family History  ?Problem Relation Age of Onset  ? Healthy Mother   ? Healthy Father   ? Cancer - Other Maternal Grandfather   ?     stomach cancer  ? Cancer - Other Paternal Grandfather   ?     Bone cancer  ?  ? ?Social History  ? ?Tobacco Use  ? Smoking status: Never  ? Smokeless tobacco: Never  ?Vaping Use  ? Vaping Use: Never used  ?Substance Use Topics  ? Alcohol use: No  ?  Alcohol/week: 0.0 standard drinks  ? Drug use: No  ? ? ?Allergies as of 12/22/2021 - Review Complete 12/22/2021  ?Allergen Reaction Noted  ? Sulfa antibiotics  06/01/2014  ? ? ?Current Outpatient Medications:  ?  FLUoxetine (PROZAC) 20 MG capsule, TAKE 3 CAPSULES BY MOUTH ONCE DAILY, Disp: 180 capsule, Rfl: 0 ?  ondansetron (ZOFRAN-ODT) 4 MG disintegrating  tablet, Take 1 tablet (4 mg total) by mouth every 8 (eight) hours as needed for nausea or vomiting., Disp: 40 tablet, Rfl: 0 ? ?Review of Systems:    ?All systems reviewed and negative except where noted in HPI. ? ? Physical Exam:  ?BP 123/80 (BP Location: Left Arm, Patient Position: Sitting, Cuff Size: Normal)   Pulse 90   Temp 99.4 ?F (37.4 ?C) (Oral)   Ht 5\' 6"  (1.676 m)   Wt 167 lb 2 oz (75.8 kg)   LMP 12/04/2021 (Exact Date)   BMI 26.97 kg/m?  ?Patient's last menstrual period was 12/04/2021 (exact  date). ? ?General:   Alert,  Well-developed, well-nourished, pleasant and cooperative in NAD ?Head:  Normocephalic and atraumatic. ?Eyes:  Sclera clear, no icterus.   Conjunctiva pink. ?Ears:  Normal auditory acuity. ?Nose:  No deformity, discharge, or lesions. ?Mouth:  No deformity or lesions,oropharynx pink & moist. ?Neck:  Supple; no masses or thyromegaly. ?Lungs:  Respirations even and unlabored.  Clear throughout to auscultation.   No wheezes, crackles, or rhonchi. No acute distress. ?Heart:  Regular rate and rhythm; no murmurs, clicks, rubs, or gallops. ?Abdomen:  Normal bowel sounds.  Mild epigastric tenderness, moderately distended, tympanic to percussion without masses, hepatosplenomegaly or hernias noted.  No guarding or rebound tenderness.   ?Rectal: Not performed ?Msk:  Symmetrical without gross deformities. Good, equal movement & strength bilaterally. ?Pulses:  Normal pulses noted. ?Extremities:  No clubbing or edema.  No cyanosis. ?Neurologic:  Alert and oriented x3;  grossly normal neurologically. ?Skin:  Intact without significant lesions or rashes. No jaundice. ?Psych:  Alert and cooperative. Normal mood and affect. ? ?Imaging Studies: ?Reviewed ? ?Assessment and Plan:  ? ?DYANA MAGNER is a 36 y.o. pleasant Caucasian female with no significant past medical history is seen in consultation for approximately 1 month history of symptoms of abdominal pain associated with nausea, diarrhea and an episode of rectal bleeding.  Symptoms started after eating [employer] and tuna and tuna salad.  CT revealed mild rectosigmoid wall thickening as well as leukocytosis ? ?Recommend GI profile PCR to rule out infection.  If this is negative, recommend colonoscopy to evaluate for inflammatory bowel disease ? ? ?Follow up based on the above work-up ? ? ?31, MD ? ?

## 2021-12-26 DIAGNOSIS — R197 Diarrhea, unspecified: Secondary | ICD-10-CM | POA: Diagnosis not present

## 2021-12-29 LAB — GI PROFILE, STOOL, PCR

## 2022-01-04 ENCOUNTER — Telehealth: Payer: Self-pay | Admitting: Internal Medicine

## 2022-01-04 DIAGNOSIS — D72829 Elevated white blood cell count, unspecified: Secondary | ICD-10-CM

## 2022-01-04 NOTE — Telephone Encounter (Signed)
Patient has a lab appt 5/30, there are no orders in.

## 2022-01-04 NOTE — Addendum Note (Signed)
Addended by: Charm Barges on: 01/04/2022 03:50 PM   Modules accepted: Orders

## 2022-01-04 NOTE — Telephone Encounter (Signed)
Order placed for f/u labs.  

## 2022-01-10 ENCOUNTER — Other Ambulatory Visit (INDEPENDENT_AMBULATORY_CARE_PROVIDER_SITE_OTHER): Payer: BC Managed Care – PPO

## 2022-01-10 DIAGNOSIS — D72829 Elevated white blood cell count, unspecified: Secondary | ICD-10-CM | POA: Diagnosis not present

## 2022-01-10 LAB — BASIC METABOLIC PANEL
BUN: 15 mg/dL (ref 6–23)
CO2: 27 mEq/L (ref 19–32)
Calcium: 9.6 mg/dL (ref 8.4–10.5)
Chloride: 102 mEq/L (ref 96–112)
Creatinine, Ser: 0.65 mg/dL (ref 0.40–1.20)
GFR: 114.33 mL/min (ref >60.00)
Glucose, Bld: 87 mg/dL (ref 70–99)
Potassium: 4.5 mEq/L (ref 3.5–5.1)
Sodium: 138 mEq/L (ref 135–145)

## 2022-01-10 LAB — CBC WITH DIFFERENTIAL/PLATELET
Basophils Absolute: 0 10*3/uL (ref 0.0–0.1)
Basophils Relative: 0.4 % (ref 0.0–3.0)
Eosinophils Absolute: 0.9 10*3/uL — ABNORMAL HIGH (ref 0.0–0.7)
Eosinophils Relative: 11.8 % — ABNORMAL HIGH (ref 0.0–5.0)
HCT: 41 % (ref 36.0–46.0)
Hemoglobin: 14 g/dL (ref 12.0–15.0)
Lymphocytes Relative: 30.9 % (ref 12.0–46.0)
Lymphs Abs: 2.4 10*3/uL (ref 0.7–4.0)
MCHC: 34.1 g/dL (ref 30.0–36.0)
MCV: 88.4 fl (ref 78.0–100.0)
Monocytes Absolute: 0.4 10*3/uL (ref 0.1–1.0)
Monocytes Relative: 5.4 % (ref 3.0–12.0)
Neutro Abs: 4 10*3/uL (ref 1.4–7.7)
Neutrophils Relative %: 51.5 % (ref 43.0–77.0)
Platelets: 313 10*3/uL (ref 150.0–400.0)
RBC: 4.64 Mil/uL (ref 3.87–5.11)
RDW: 13.1 % (ref 11.5–15.5)
WBC: 7.9 10*3/uL (ref 4.0–10.5)

## 2022-01-10 LAB — TSH: TSH: 1.28 u[IU]/mL (ref 0.35–5.50)

## 2022-01-11 ENCOUNTER — Other Ambulatory Visit: Payer: Self-pay | Admitting: Internal Medicine

## 2022-01-11 ENCOUNTER — Other Ambulatory Visit (INDEPENDENT_AMBULATORY_CARE_PROVIDER_SITE_OTHER): Payer: BC Managed Care – PPO

## 2022-01-11 DIAGNOSIS — E7800 Pure hypercholesterolemia, unspecified: Secondary | ICD-10-CM

## 2022-01-11 LAB — HEPATIC FUNCTION PANEL
ALT: 17 U/L (ref 0–35)
AST: 18 U/L (ref 0–37)
Albumin: 4.4 g/dL (ref 3.5–5.2)
Alkaline Phosphatase: 72 U/L (ref 39–117)
Bilirubin, Direct: 0.1 mg/dL (ref 0.0–0.3)
Total Bilirubin: 0.5 mg/dL (ref 0.2–1.2)
Total Protein: 7.1 g/dL (ref 6.0–8.3)

## 2022-01-11 LAB — LIPID PANEL
Cholesterol: 253 mg/dL — ABNORMAL HIGH (ref 0–200)
HDL: 37 mg/dL — ABNORMAL LOW (ref >39.00)
Total CHOL/HDL Ratio: 7
Triglycerides: 648 mg/dL — ABNORMAL HIGH (ref 0.0–149.0)

## 2022-01-11 LAB — LDL CHOLESTEROL, DIRECT: Direct LDL: 115 mg/dL

## 2022-01-11 NOTE — Progress Notes (Signed)
Order placed for add on lab.  °

## 2022-01-11 NOTE — Progress Notes (Signed)
Orders placed for add on lab.   

## 2022-01-12 ENCOUNTER — Telehealth: Payer: Self-pay | Admitting: Internal Medicine

## 2022-01-12 NOTE — Telephone Encounter (Signed)
Pt called in stating that she had received her results through mychart... Pt stated that she have questions about her results and would like for someone to contact her back... Pt requesting callback at [phone].Marland KitchenMarland Kitchen

## 2022-01-13 ENCOUNTER — Other Ambulatory Visit: Payer: Self-pay | Admitting: Internal Medicine

## 2022-01-13 DIAGNOSIS — D721 Eosinophilia, unspecified: Secondary | ICD-10-CM

## 2022-01-13 NOTE — Progress Notes (Signed)
Order placed for f/u cbc.   

## 2022-02-17 ENCOUNTER — Telehealth: Payer: Self-pay | Admitting: Internal Medicine

## 2022-02-17 NOTE — Telephone Encounter (Signed)
Agree with need for evaluation.  If continues to decline, spot check pressure.  If headache, etc. - needs to be seen.  Can schedule f/u with me, but needs to be seen more acutely if acute issues.

## 2022-02-17 NOTE — Telephone Encounter (Signed)
Pt is sched for 7/11

## 2022-02-17 NOTE — Telephone Encounter (Signed)
Pt called in stating that she woke up this morning with her head hurting.... Pt stated that at work she felt light headed and she felt like she had to throw up... Pt stated that her job took her BP and the reading was 140/103... Pt stated that this was the first time this happened to her.... No avail appt today... Transfer pt to access nurse.Marland KitchenMarland Kitchen

## 2022-02-17 NOTE — Telephone Encounter (Signed)
S/w pt - woke up this morning with bad headache. While pt was at work this morning she started to feel nauseous, and light headed. Pt stated she felt panicked due to light headedness. Had coworker take her bp and was 140/103. Pt rested for a bit, and took 800mg  of Ibuprofen, started to feel better. Re-took bp, 120/73 Pt continuing to feel better - nausea is gone, no light headedness.  Pt advised eval recommended - pt declined.

## 2022-02-21 ENCOUNTER — Encounter: Payer: Self-pay | Admitting: Internal Medicine

## 2022-02-21 ENCOUNTER — Ambulatory Visit (INDEPENDENT_AMBULATORY_CARE_PROVIDER_SITE_OTHER): Payer: BC Managed Care – PPO | Admitting: Internal Medicine

## 2022-02-21 DIAGNOSIS — F429 Obsessive-compulsive disorder, unspecified: Secondary | ICD-10-CM | POA: Diagnosis not present

## 2022-02-21 DIAGNOSIS — E7800 Pure hypercholesterolemia, unspecified: Secondary | ICD-10-CM

## 2022-02-21 DIAGNOSIS — R519 Headache, unspecified: Secondary | ICD-10-CM

## 2022-02-21 DIAGNOSIS — K529 Noninfective gastroenteritis and colitis, unspecified: Secondary | ICD-10-CM

## 2022-02-21 DIAGNOSIS — F339 Major depressive disorder, recurrent, unspecified: Secondary | ICD-10-CM | POA: Diagnosis not present

## 2022-02-21 DIAGNOSIS — I10 Essential (primary) hypertension: Secondary | ICD-10-CM

## 2022-02-21 MED ORDER — AMLODIPINE BESYLATE 2.5 MG PO TABS: 2.5000 mg | ORAL_TABLET | Freq: Every day | ORAL | 2 refills | Status: DC

## 2022-02-21 MED ORDER — FLUOXETINE HCL 20 MG PO CAPS: 60.0000 mg | ORAL_CAPSULE | Freq: Every day | ORAL | 1 refills | Status: DC

## 2022-02-21 NOTE — Progress Notes (Signed)
Patient ID: Becky Mccoy, female   DOB: 1986/05/16, 36 y.o.   MRN: [id number]   Subjective:    Patient ID: Becky Mccoy, female    DOB: 1986/03/29, 36 y.o.   MRN: [id number]   Patient here for a scheduled follow up.   Chief Complaint  Patient presents with   Hypertension   Hyperlipidemia   .   HPI Recently reported headache (02/17/22).  Woke with headache.  Some associated nausea.  Felt a light headed.  Went to work.  Blood pressure elevated - 140/103.  Took ibuprofen and rested.  Blood pressure recheck 120/73. Has had some neck pain.  Turning her head aggravates.  Also increased discomfort with looking down.  Minimal headache today.  No chest pain or sob.  No acid reflux reported.  Previously noticed upper arm felt numb - just localized to upper arm - between shoulder and elbow.  No arm weakness. Also, recently evaluated by GI for diarrhea and abdominal discomfort.  Previous RUQ [place] - negative except for hepatic steatosis.  ER 5/2 - abdominal discomfort, diarrhea and rectal bleeding.  CT - thickening of the rectosigmoid colon suspicious for proctocolitis and leukocytosis.  Stool studies revealed norovirus.  Bowels better now.  Eating. Discussed recent labs.  Blood pressure elevated.    Past Medical History:  Diagnosis Date   COVID-19    04/2021 11/202   History of chicken pox    Hx: UTI (urinary tract infection)    Obsessive-compulsive and related disorder    OCD (obsessive compulsive disorder)    Past Surgical History:  Procedure Laterality Date   WISDOM TOOTH EXTRACTION     Family History  Problem Relation Age of Onset   Healthy Mother    Healthy Father    Cancer - Other Maternal Grandfather        stomach cancer   Cancer - Other Paternal Grandfather        Bone cancer   Social History   Socioeconomic History   Marital status: Married    Spouse name: Not on file   Number of children: Not on file   Years of education: Not on file   Highest education level: Not on  file  Occupational History   Not on file  Tobacco Use   Smoking status: Never   Smokeless tobacco: Never  Vaping Use   Vaping Use: Never used  Substance and Sexual Activity   Alcohol use: No    Alcohol/week: 0.0 standard drinks of alcohol   Drug use: No   Sexual activity: Yes    Birth control/protection: None    Comment: Vasectomy  Other Topics Concern   Not on file  Social History Narrative   Not on file   Social Determinants of Health   Financial Resource Strain: Not on file  Food Insecurity: Not on file  Transportation Needs: Not on file  Physical Activity: Not on file  Stress: Not on file  Social Connections: Not on file     Review of Systems  Constitutional:  Negative for appetite change and unexpected weight change.  HENT:  Negative for congestion and sinus pressure.   Respiratory:  Negative for cough, chest tightness and shortness of breath.   Cardiovascular:  Negative for chest pain, palpitations and leg swelling.  Gastrointestinal:  Negative for nausea and vomiting.       Previous abdominal pain and diarrhea as outlined.   Genitourinary:  Negative for difficulty urinating and dysuria.  Musculoskeletal:  Negative for joint  swelling and myalgias.  Skin:  Negative for color change and rash.  Neurological:  Negative for dizziness, light-headedness and headaches.  Psychiatric/Behavioral:  Negative for agitation and dysphoric mood.        Doing well on current medication.  No longer seeing psychiatry - feels stable.  Request refills here.        Objective:     BP 138/84 (BP Location: Left Arm, Patient Position: Sitting, Cuff Size: Large)   Pulse 94   Temp 98 F (36.7 C) (Temporal)   Resp 18   Ht 5\' 6"  (1.676 m)   Wt 170 lb 9.6 oz (77.4 kg)   SpO2 98%   BMI 27.54 kg/m  Wt Readings from Last 3 Encounters:  02/21/22 170 lb 9.6 oz (77.4 kg)  12/22/21 167 lb 2 oz (75.8 kg)  12/13/21 165 lb (74.8 kg)    Physical Exam Vitals reviewed.  Constitutional:       General: She is not in acute distress.    Appearance: Normal appearance.  HENT:     Head: Normocephalic and atraumatic.     Right Ear: External ear normal.     Left Ear: External ear normal.  Eyes:     General: No scleral icterus.       Right eye: No discharge.        Left eye: No discharge.     Conjunctiva/sclera: Conjunctivae normal.  Neck:     Thyroid: No thyromegaly.  Cardiovascular:     Rate and Rhythm: Normal rate and regular rhythm.  Pulmonary:     Effort: No respiratory distress.     Breath sounds: Normal breath sounds. No wheezing.  Abdominal:     General: Bowel sounds are normal.     Palpations: Abdomen is soft.     Tenderness: There is no abdominal tenderness.  Musculoskeletal:        General: No swelling or tenderness.     Cervical back: Neck supple. No tenderness.  Lymphadenopathy:     Cervical: No cervical adenopathy.  Skin:    Findings: No erythema or rash.  Neurological:     Mental Status: She is alert.  Psychiatric:        Mood and Affect: Mood normal.        Behavior: Behavior normal.      Outpatient Encounter Medications as of 02/21/2022  Medication Sig   amLODipine (NORVASC) 2.5 MG tablet Take 1 tablet (2.5 mg total) by mouth daily.   ondansetron (ZOFRAN-ODT) 4 MG disintegrating tablet Take 1 tablet (4 mg total) by mouth every 8 (eight) hours as needed for nausea or vomiting.   [DISCONTINUED] FLUoxetine (PROZAC) 20 MG capsule TAKE 3 CAPSULES BY MOUTH ONCE DAILY   FLUoxetine (PROZAC) 20 MG capsule Take 3 capsules (60 mg total) by mouth daily.   No facility-administered encounter medications on file as of 02/21/2022.     Lab Results  Component Value Date   WBC 7.9 01/10/2022   HGB 14.0 01/10/2022   HCT 41.0 01/10/2022   PLT 313.0 01/10/2022   GLUCOSE 87 01/10/2022   CHOL 253 (H) 01/11/2022   TRIG (H) 01/11/2022    648.0 Triglyceride is over 400; calculations on Lipids are invalid.   HDL 37.00 (L) 01/11/2022   LDLDIRECT 115.0 01/11/2022    LDLCALC 152 (H) 09/19/2018   ALT 17 01/11/2022   AST 18 01/11/2022   NA 138 01/10/2022   K 4.5 01/10/2022   CL 102 01/10/2022   CREATININE 0.65 01/10/2022  BUN 15 01/10/2022   CO2 27 01/10/2022   TSH 1.28 01/10/2022    CT ABDOMEN PELVIS W CONTRAST  Result Date: 12/13/2021 CLINICAL DATA:  Abdominal pain and diarrhea for 1 month. EXAM: CT ABDOMEN AND PELVIS WITH CONTRAST TECHNIQUE: Multidetector CT imaging of the abdomen and pelvis was performed using the standard protocol following bolus administration of intravenous contrast. RADIATION DOSE REDUCTION: This exam was performed according to the departmental dose-optimization program which includes automated exposure control, adjustment of the mA and/or kV according to patient size and/or use of iterative reconstruction technique. CONTRAST:  OMNIPAQUE IOHEXOL 300 MG/ML  SOLN COMPARISON:  None Available. FINDINGS: Lower Chest: No acute findings. Hepatobiliary: No hepatic masses identified. Mild-to-moderate diffuse hepatic steatosis. Gallbladder is unremarkable. No evidence of biliary ductal dilatation. Pancreas:  No mass or inflammatory changes. Spleen: Within normal limits in size and appearance. Adrenals/Urinary Tract: No masses identified. Small benign-appearing bilateral renal cysts noted (no followup imaging is recommended). No evidence of ureteral calculi or hydronephrosis. Stomach/Bowel: Colon is nondistended which limits evaluation, but mild long segment wall thickening is suspected involving the rectosigmoid colon. This is suspicious for proctocolitis. No evidence of bowel obstruction. No other inflammatory process or abnormal fluid collections identified. Vascular/Lymphatic: No pathologically enlarged lymph nodes. No acute vascular findings. Reproductive:  No mass or other significant abnormality. Other:  None. Musculoskeletal:  No suspicious bone lesions identified. IMPRESSION: Suspect mild proctocolitis. Hepatic steatosis.  Electronically Signed   By: Danae Orleans M.D.   On: 12/13/2021 16:05       Assessment & Plan:   Problem List Items Addressed This Visit     Colitis    Stool studies - norovirus.  CT as outlined.  Saw GI. Doing better.  Follow.       Depression, major, recurrent (HCC)    Has been seeing psychiatry.  Has been stable on current medication regimen (zoloft).  Since stable, wants to hold on f/u with psychiatry.  Plans to start getting zoloft refilled through this office.  Will monitor symptoms.  If any change or worsening symptoms, will need to f/u with psychiatry.  She is in agreement.       Relevant Medications   FLUoxetine (PROZAC) 20 MG capsule   Headache    Recent headache as outlined.  May be multifactorial.  Neck pain.  Discussed tylenol and neck support as outlined.  Treat blood pressure.  Discussed further w/up, including scanning, etc.  She wants to monitor.  Is getting better.  Follow.  Will call or be evaluated if any change or worsening symptoms.       Relevant Medications   FLUoxetine (PROZAC) 20 MG capsule   amLODipine (NORVASC) 2.5 MG tablet   Hypercholesterolemia    discused recent labs.  Triglycerides significantly elevated.  No longer breast feeding.  Discussed low carb diet and exercise.  Discussed medication.  She wants to hold on medication.  Wants to work on diet and exercise.  Follow lipid panel.       Relevant Medications   amLODipine (NORVASC) 2.5 MG tablet   Hypertension, essential    Blood pressure remains elevated.  Start amlodipine 2.5mg  q day.  Follow pressures.  Follow metabolic panel.       Relevant Medications   amLODipine (NORVASC) 2.5 MG tablet   OCD (obsessive compulsive disorder)    Continues on zoloft.  Doing well on current medication regimen.       Relevant Medications   FLUoxetine (PROZAC) 20 MG capsule  Corday Wyka, MD  

## 2022-02-27 ENCOUNTER — Encounter: Payer: Self-pay | Admitting: Internal Medicine

## 2022-02-27 DIAGNOSIS — R519 Headache, unspecified: Secondary | ICD-10-CM | POA: Insufficient documentation

## 2022-02-27 DIAGNOSIS — I10 Essential (primary) hypertension: Secondary | ICD-10-CM | POA: Insufficient documentation

## 2022-02-27 NOTE — Assessment & Plan Note (Signed)
discused recent labs.  Triglycerides significantly elevated.  No longer breast feeding.  Discussed low carb diet and exercise.  Discussed medication.  She wants to hold on medication.  Wants to work on diet and exercise.  Follow lipid panel.

## 2022-02-27 NOTE — Assessment & Plan Note (Signed)
Recent headache as outlined.  May be multifactorial.  Neck pain.  Discussed tylenol and neck support as outlined.  Treat blood pressure.  Discussed further w/up, including scanning, etc.  She wants to monitor.  Is getting better.  Follow.  Will call or be evaluated if any change or worsening symptoms.

## 2022-02-27 NOTE — Assessment & Plan Note (Signed)
Has been seeing psychiatry.  Has been stable on current medication regimen (zoloft).  Since stable, wants to hold on f/u with psychiatry.  Plans to start getting zoloft refilled through this office.  Will monitor symptoms.  If any change or worsening symptoms, will need to f/u with psychiatry.  She is in agreement.

## 2022-02-27 NOTE — Assessment & Plan Note (Signed)
Continues on zoloft.  Doing well on current medication regimen.

## 2022-02-27 NOTE — Assessment & Plan Note (Signed)
Stool studies - norovirus.  CT as outlined.  Saw GI. Doing better.  Follow.

## 2022-02-27 NOTE — Assessment & Plan Note (Signed)
Blood pressure remains elevated.  Start amlodipine 2.5mg q day.  Follow pressures.  Follow metabolic panel.   °

## 2022-03-23 ENCOUNTER — Encounter: Payer: Self-pay | Admitting: Internal Medicine

## 2022-03-23 ENCOUNTER — Ambulatory Visit (INDEPENDENT_AMBULATORY_CARE_PROVIDER_SITE_OTHER): Payer: BC Managed Care – PPO | Admitting: Internal Medicine

## 2022-03-23 DIAGNOSIS — E7800 Pure hypercholesterolemia, unspecified: Secondary | ICD-10-CM

## 2022-03-23 DIAGNOSIS — D721 Eosinophilia, unspecified: Secondary | ICD-10-CM

## 2022-03-23 DIAGNOSIS — I10 Essential (primary) hypertension: Secondary | ICD-10-CM | POA: Diagnosis not present

## 2022-03-23 DIAGNOSIS — R519 Headache, unspecified: Secondary | ICD-10-CM

## 2022-03-23 DIAGNOSIS — F339 Major depressive disorder, recurrent, unspecified: Secondary | ICD-10-CM

## 2022-03-23 DIAGNOSIS — F429 Obsessive-compulsive disorder, unspecified: Secondary | ICD-10-CM

## 2022-03-23 DIAGNOSIS — K219 Gastro-esophageal reflux disease without esophagitis: Secondary | ICD-10-CM

## 2022-03-23 DIAGNOSIS — K760 Fatty (change of) liver, not elsewhere classified: Secondary | ICD-10-CM

## 2022-03-23 NOTE — Progress Notes (Signed)
Patient ID: Becky Mccoy, female   DOB: 19-Jul-1986, 36 y.o.   MRN: [id number]   Subjective:    Patient ID: Becky Mccoy, female    DOB: Apr 15, 1986, 36 y.o.   MRN: [id number]   Patient here for a scheduled follow up.   Chief Complaint  Patient presents with   Follow-up    4 week follow up    .   HPI Here to follow up regarding headaches, blood pressure and depression/OCD.  Was started on amlodipine last visit.  Blood pressure doing better. Massage - neck.  Headache is better. No chest pain. Acid reflux.  No abdominal pain.  No bowel issues reported.     Past Medical History:  Diagnosis Date   COVID-19    04/2021 11/202   History of chicken pox    Hx: UTI (urinary tract infection)    Obsessive-compulsive and related disorder    OCD (obsessive compulsive disorder)    Past Surgical History:  Procedure Laterality Date   WISDOM TOOTH EXTRACTION     Family History  Problem Relation Age of Onset   Healthy Mother    Healthy Father    Cancer - Other Maternal Grandfather        stomach cancer   Cancer - Other Paternal Grandfather        Bone cancer   Social History   Socioeconomic History   Marital status: Married    Spouse name: Not on file   Number of children: Not on file   Years of education: Not on file   Highest education level: Not on file  Occupational History   Not on file  Tobacco Use   Smoking status: Never   Smokeless tobacco: Never  Vaping Use   Vaping Use: Never used  Substance and Sexual Activity   Alcohol use: No    Alcohol/week: 0.0 standard drinks of alcohol   Drug use: No   Sexual activity: Yes    Birth control/protection: None    Comment: Vasectomy  Other Topics Concern   Not on file  Social History Narrative   Not on file   Social Determinants of Health   Financial Resource Strain: Not on file  Food Insecurity: Not on file  Transportation Needs: Not on file  Physical Activity: Not on file  Stress: Not on file  Social  Connections: Not on file     Review of Systems  Constitutional:  Negative for appetite change and unexpected weight change.  HENT:  Negative for sinus pressure.   Respiratory:  Negative for cough, chest tightness and shortness of breath.   Cardiovascular:  Negative for chest pain, palpitations and leg swelling.  Gastrointestinal:  Negative for abdominal pain, diarrhea, nausea and vomiting.  Genitourinary:  Negative for difficulty urinating and dysuria.  Musculoskeletal:  Negative for joint swelling and myalgias.  Skin:  Negative for color change and rash.  Neurological:  Negative for dizziness, light-headedness and headaches.  Psychiatric/Behavioral:  Negative for agitation and dysphoric mood.        Objective:     BP 114/82 (BP Location: Left Arm, Patient Position: Sitting, Cuff Size: Normal)   Pulse 92   Temp 98.9 F (37.2 C) (Oral)   Ht 5\' 6"  (1.676 m)   Wt 168 lb 12.8 oz (76.6 kg)   SpO2 98%   BMI 27.25 kg/m  Wt Readings from Last 3 Encounters:  03/23/22 168 lb 12.8 oz (76.6 kg)  02/21/22 170 lb 9.6 oz (77.4 kg)  12/22/21  167 lb 2 oz (75.8 kg)    Physical Exam Vitals reviewed.  Constitutional:      General: She is not in acute distress.    Appearance: Normal appearance.  HENT:     Head: Normocephalic and atraumatic.     Right Ear: External ear normal.     Left Ear: External ear normal.  Eyes:     General: No scleral icterus.       Right eye: No discharge.        Left eye: No discharge.     Conjunctiva/sclera: Conjunctivae normal.  Neck:     Thyroid: No thyromegaly.  Cardiovascular:     Rate and Rhythm: Normal rate and regular rhythm.  Pulmonary:     Effort: No respiratory distress.     Breath sounds: Normal breath sounds. No wheezing.  Abdominal:     General: Bowel sounds are normal.     Palpations: Abdomen is soft.     Tenderness: There is no abdominal tenderness.  Musculoskeletal:        General: No swelling or tenderness.     Cervical back: Neck  supple. No tenderness.  Lymphadenopathy:     Cervical: No cervical adenopathy.  Skin:    Findings: No erythema or rash.  Neurological:     Mental Status: She is alert.  Psychiatric:        Mood and Affect: Mood normal.        Behavior: Behavior normal.      Outpatient Encounter Medications as of 03/23/2022  Medication Sig   amLODipine (NORVASC) 2.5 MG tablet Take 1 tablet (2.5 mg total) by mouth daily.   FLUoxetine (PROZAC) 20 MG capsule Take 3 capsules (60 mg total) by mouth daily.   [DISCONTINUED] ondansetron (ZOFRAN-ODT) 4 MG disintegrating tablet Take 1 tablet (4 mg total) by mouth every 8 (eight) hours as needed for nausea or vomiting. (Patient not taking: Reported on 03/23/2022)   No facility-administered encounter medications on file as of 03/23/2022.     Lab Results  Component Value Date   WBC 7.9 01/10/2022   HGB 14.0 01/10/2022   HCT 41.0 01/10/2022   PLT 313.0 01/10/2022   GLUCOSE 87 01/10/2022   CHOL 253 (H) 01/11/2022   TRIG (H) 01/11/2022    648.0 Triglyceride is over 400; calculations on Lipids are invalid.   HDL 37.00 (L) 01/11/2022   LDLDIRECT 115.0 01/11/2022   LDLCALC 152 (H) 09/19/2018   ALT 17 01/11/2022   AST 18 01/11/2022   NA 138 01/10/2022   K 4.5 01/10/2022   CL 102 01/10/2022   CREATININE 0.65 01/10/2022   BUN 15 01/10/2022   CO2 27 01/10/2022   TSH 1.28 01/10/2022    CT ABDOMEN PELVIS W CONTRAST  Result Date: 12/13/2021 CLINICAL DATA:  Abdominal pain and diarrhea for 1 month. EXAM: CT ABDOMEN AND PELVIS WITH CONTRAST TECHNIQUE: Multidetector CT imaging of the abdomen and pelvis was performed using the standard protocol following bolus administration of intravenous contrast. RADIATION DOSE REDUCTION: This exam was performed according to the departmental dose-optimization program which includes automated exposure control, adjustment of the mA and/or kV according to patient size and/or use of iterative reconstruction technique. CONTRAST:  148mL  OMNIPAQUE IOHEXOL 300 MG/ML  SOLN COMPARISON:  None Available. FINDINGS: Lower Chest: No acute findings. Hepatobiliary: No hepatic masses identified. Mild-to-moderate diffuse hepatic steatosis. Gallbladder is unremarkable. No evidence of biliary ductal dilatation. Pancreas:  No mass or inflammatory changes. Spleen: Within normal limits in size and appearance.  Adrenals/Urinary Tract: No masses identified. Small benign-appearing bilateral renal cysts noted (no followup imaging is recommended). No evidence of ureteral calculi or hydronephrosis. Stomach/Bowel: Colon is nondistended which limits evaluation, but mild long segment wall thickening is suspected involving the rectosigmoid colon. This is suspicious for proctocolitis. No evidence of bowel obstruction. No other inflammatory process or abnormal fluid collections identified. Vascular/Lymphatic: No pathologically enlarged lymph nodes. No acute vascular findings. Reproductive:  No mass or other significant abnormality. Other:  None. Musculoskeletal:  No suspicious bone lesions identified. IMPRESSION: Suspect mild proctocolitis. Hepatic steatosis. Electronically Signed   By: Danae Orleans M.D.   On: 12/13/2021 16:05       Assessment & Plan:   Problem List Items Addressed This Visit     Acid reflux    Start pepcid as directed.  Follow.       Depression, major, recurrent (HCC)    Was seeing psychiatry.  Has been stable on current medication regimen (prozac).   Doing well on prozac.  Follow.       Eosinophilia    Recheck cbc to confirm stable/normal.       Relevant Orders   CBC with Differential/Platelet   Fatty liver    Has seen GI.  Discussed diet/exercise/weight loss.  Follow liver function tests.       Relevant Orders   Hepatic function panel   Headache    Headache is better.  Massage - neck.  Blood pressure better.  Will continue to monitor.  Requested referral to allergist to confirm no allergy trigger - for symptoms.       Relevant  Orders   Ambulatory referral to Allergy   Hypercholesterolemia    Low cholesterol diet and exercise.  Follow lipid panel.      Relevant Orders   Lipid panel   Hypertension, essential    On amlodipine.  Blood pressure doing better. Follow pressures.  Follow metabolic panel.       Relevant Orders   Basic metabolic panel   OCD (obsessive compulsive disorder)    Continues on prozac.  Overall doing well.  Follow.          Dale [place], MD

## 2022-03-23 NOTE — Patient Instructions (Signed)
Pepcid (famotidine) - 20mg  - take one tablet 30 minutes before your evening meal.  Call with update.

## 2022-04-02 ENCOUNTER — Encounter: Payer: Self-pay | Admitting: Internal Medicine

## 2022-04-02 DIAGNOSIS — K219 Gastro-esophageal reflux disease without esophagitis: Secondary | ICD-10-CM | POA: Insufficient documentation

## 2022-04-02 DIAGNOSIS — K760 Fatty (change of) liver, not elsewhere classified: Secondary | ICD-10-CM | POA: Insufficient documentation

## 2022-04-02 DIAGNOSIS — D721 Eosinophilia, unspecified: Secondary | ICD-10-CM | POA: Insufficient documentation

## 2022-04-02 NOTE — Assessment & Plan Note (Signed)
Start pepcid as directed.  Follow.

## 2022-04-02 NOTE — Assessment & Plan Note (Signed)
Low cholesterol diet and exercise.  Follow lipid panel.   

## 2022-04-02 NOTE — Assessment & Plan Note (Signed)
Continues on prozac.  Overall doing well.  Follow.   

## 2022-04-02 NOTE — Assessment & Plan Note (Signed)
Headache is better.  Massage - neck.  Blood pressure better.  Will continue to monitor.  Requested referral to allergist to confirm no allergy trigger - for symptoms.

## 2022-04-02 NOTE — Assessment & Plan Note (Signed)
Has seen GI.  Discussed diet/exercise/weight loss.  Follow liver function tests.  

## 2022-04-02 NOTE — Assessment & Plan Note (Signed)
Was seeing psychiatry.  Has been stable on current medication regimen (prozac).   Doing well on prozac.  Follow.

## 2022-04-02 NOTE — Assessment & Plan Note (Signed)
On amlodipine.  Blood pressure doing better. Follow pressures.  Follow metabolic panel.  

## 2022-04-02 NOTE — Assessment & Plan Note (Signed)
Recheck cbc to confirm stable/normal.  

## 2022-04-10 ENCOUNTER — Telehealth: Payer: Self-pay | Admitting: Internal Medicine

## 2022-04-10 NOTE — Telephone Encounter (Signed)
Clydie Braun from [facility] called regarding needing an additional diagnostic code for pt  Specimen number [id number]

## 2022-04-10 NOTE — Telephone Encounter (Signed)
I spoke Casimiro Needle from [facility] & was able to provide dx code for colitis as well for 12/12/21.

## 2022-04-20 ENCOUNTER — Other Ambulatory Visit (INDEPENDENT_AMBULATORY_CARE_PROVIDER_SITE_OTHER): Payer: BC Managed Care – PPO

## 2022-04-20 DIAGNOSIS — E7800 Pure hypercholesterolemia, unspecified: Secondary | ICD-10-CM | POA: Diagnosis not present

## 2022-04-20 DIAGNOSIS — K760 Fatty (change of) liver, not elsewhere classified: Secondary | ICD-10-CM | POA: Diagnosis not present

## 2022-04-20 DIAGNOSIS — I10 Essential (primary) hypertension: Secondary | ICD-10-CM | POA: Diagnosis not present

## 2022-04-20 DIAGNOSIS — D721 Eosinophilia, unspecified: Secondary | ICD-10-CM | POA: Diagnosis not present

## 2022-04-20 DIAGNOSIS — E782 Mixed hyperlipidemia: Secondary | ICD-10-CM | POA: Diagnosis not present

## 2022-04-20 LAB — HEPATIC FUNCTION PANEL
ALT: 15 U/L (ref 0–35)
AST: 15 U/L (ref 0–37)
Albumin: 4.2 g/dL (ref 3.5–5.2)
Alkaline Phosphatase: 60 U/L (ref 39–117)
Bilirubin, Direct: 0.1 mg/dL (ref 0.0–0.3)
Total Bilirubin: 0.6 mg/dL (ref 0.2–1.2)
Total Protein: 7 g/dL (ref 6.0–8.3)

## 2022-04-20 LAB — CBC WITH DIFFERENTIAL/PLATELET
Basophils Absolute: 0.1 10*3/uL (ref 0.0–0.1)
Basophils Relative: 0.7 % (ref 0.0–3.0)
Eosinophils Absolute: 0.3 10*3/uL (ref 0.0–0.7)
Eosinophils Relative: 4.3 % (ref 0.0–5.0)
HCT: 41.9 % (ref 36.0–46.0)
Hemoglobin: 14.4 g/dL (ref 12.0–15.0)
Lymphocytes Relative: 31.7 % (ref 12.0–46.0)
Lymphs Abs: 2.5 10*3/uL (ref 0.7–4.0)
MCHC: 34.4 g/dL (ref 30.0–36.0)
MCV: 89.4 fl (ref 78.0–100.0)
Monocytes Absolute: 0.4 10*3/uL (ref 0.1–1.0)
Monocytes Relative: 5.5 % (ref 3.0–12.0)
Neutro Abs: 4.6 10*3/uL (ref 1.4–7.7)
Neutrophils Relative %: 57.8 % (ref 43.0–77.0)
Platelets: 276 10*3/uL (ref 150.0–400.0)
RBC: 4.69 Mil/uL (ref 3.87–5.11)
RDW: 12.7 % (ref 11.5–15.5)
WBC: 7.9 10*3/uL (ref 4.0–10.5)

## 2022-04-20 LAB — BASIC METABOLIC PANEL
BUN: 13 mg/dL (ref 6–23)
CO2: 26 mEq/L (ref 19–32)
Calcium: 9.2 mg/dL (ref 8.4–10.5)
Chloride: 104 mEq/L (ref 96–112)
Creatinine, Ser: 0.64 mg/dL (ref 0.40–1.20)
GFR: 114.54 mL/min (ref >60.00)
Glucose, Bld: 79 mg/dL (ref 70–99)
Potassium: 4.4 mEq/L (ref 3.5–5.1)
Sodium: 138 mEq/L (ref 135–145)

## 2022-04-20 LAB — LIPID PANEL
Cholesterol: 237 mg/dL — ABNORMAL HIGH (ref 0–200)
HDL: 37.4 mg/dL — ABNORMAL LOW (ref >39.00)
Total CHOL/HDL Ratio: 6
Triglycerides: 454 mg/dL — ABNORMAL HIGH (ref 0.0–149.0)

## 2022-04-20 LAB — LDL CHOLESTEROL, DIRECT: Direct LDL: 135 mg/dL

## 2022-05-07 DIAGNOSIS — J029 Acute pharyngitis, unspecified: Secondary | ICD-10-CM | POA: Diagnosis not present

## 2022-05-07 DIAGNOSIS — J069 Acute upper respiratory infection, unspecified: Secondary | ICD-10-CM | POA: Diagnosis not present

## 2022-05-20 ENCOUNTER — Other Ambulatory Visit: Payer: Self-pay | Admitting: Internal Medicine

## 2022-05-24 ENCOUNTER — Other Ambulatory Visit: Payer: Self-pay

## 2022-05-24 ENCOUNTER — Encounter: Payer: Self-pay | Admitting: Internal Medicine

## 2022-05-24 ENCOUNTER — Ambulatory Visit (INDEPENDENT_AMBULATORY_CARE_PROVIDER_SITE_OTHER): Payer: BC Managed Care – PPO

## 2022-05-24 ENCOUNTER — Ambulatory Visit (INDEPENDENT_AMBULATORY_CARE_PROVIDER_SITE_OTHER): Payer: BC Managed Care – PPO | Admitting: Internal Medicine

## 2022-05-24 VITALS — BP 108/70 | HR 101 | Temp 99.2°F | Ht 66.0 in | Wt 170.2 lb

## 2022-05-24 DIAGNOSIS — M549 Dorsalgia, unspecified: Secondary | ICD-10-CM | POA: Insufficient documentation

## 2022-05-24 DIAGNOSIS — I10 Essential (primary) hypertension: Secondary | ICD-10-CM | POA: Diagnosis not present

## 2022-05-24 DIAGNOSIS — M546 Pain in thoracic spine: Secondary | ICD-10-CM

## 2022-05-24 DIAGNOSIS — M545 Low back pain, unspecified: Secondary | ICD-10-CM | POA: Insufficient documentation

## 2022-05-24 DIAGNOSIS — K760 Fatty (change of) liver, not elsewhere classified: Secondary | ICD-10-CM

## 2022-05-24 DIAGNOSIS — F429 Obsessive-compulsive disorder, unspecified: Secondary | ICD-10-CM

## 2022-05-24 DIAGNOSIS — F339 Major depressive disorder, recurrent, unspecified: Secondary | ICD-10-CM | POA: Diagnosis not present

## 2022-05-24 DIAGNOSIS — E7800 Pure hypercholesterolemia, unspecified: Secondary | ICD-10-CM | POA: Diagnosis not present

## 2022-05-24 DIAGNOSIS — R519 Headache, unspecified: Secondary | ICD-10-CM

## 2022-05-24 LAB — CBC WITH DIFFERENTIAL/PLATELET
Basophils Absolute: 0.1 10*3/uL (ref 0.0–0.1)
Basophils Relative: 0.7 % (ref 0.0–3.0)
Eosinophils Absolute: 0.3 10*3/uL (ref 0.0–0.7)
Eosinophils Relative: 3.2 % (ref 0.0–5.0)
HCT: 41.8 % (ref 36.0–46.0)
Hemoglobin: 14.2 g/dL (ref 12.0–15.0)
Lymphocytes Relative: 28.8 % (ref 12.0–46.0)
Lymphs Abs: 2.6 10*3/uL (ref 0.7–4.0)
MCHC: 33.9 g/dL (ref 30.0–36.0)
MCV: 89.4 fl (ref 78.0–100.0)
Monocytes Absolute: 0.5 10*3/uL (ref 0.1–1.0)
Monocytes Relative: 5 % (ref 3.0–12.0)
Neutro Abs: 5.6 10*3/uL (ref 1.4–7.7)
Neutrophils Relative %: 62.3 % (ref 43.0–77.0)
Platelets: 323 10*3/uL (ref 150.0–400.0)
RBC: 4.67 Mil/uL (ref 3.87–5.11)
RDW: 12.8 % (ref 11.5–15.5)
WBC: 9.1 10*3/uL (ref 4.0–10.5)

## 2022-05-24 LAB — URINALYSIS, ROUTINE W REFLEX MICROSCOPIC
Bilirubin Urine: NEGATIVE
Ketones, ur: 15 — AB
Leukocytes,Ua: NEGATIVE
Nitrite: NEGATIVE
Specific Gravity, Urine: 1.02 (ref 1.000–1.030)
Total Protein, Urine: NEGATIVE
Urine Glucose: NEGATIVE
Urobilinogen, UA: 0.2 (ref 0.0–1.0)
pH: 6 (ref 5.0–8.0)

## 2022-05-24 LAB — BASIC METABOLIC PANEL
BUN: 17 mg/dL (ref 6–23)
CO2: 24 mEq/L (ref 19–32)
Calcium: 9.3 mg/dL (ref 8.4–10.5)
Chloride: 102 mEq/L (ref 96–112)
Creatinine, Ser: 0.62 mg/dL (ref 0.40–1.20)
GFR: 115.35 mL/min (ref >60.00)
Glucose, Bld: 86 mg/dL (ref 70–99)
Potassium: 4.2 mEq/L (ref 3.5–5.1)
Sodium: 136 mEq/L (ref 135–145)

## 2022-05-24 LAB — POCT URINE PREGNANCY: Preg Test, Ur: NEGATIVE

## 2022-05-24 MED ORDER — METHYLPREDNISOLONE 4 MG PO TBPK: ORAL_TABLET | ORAL | 0 refills | Status: DC

## 2022-05-24 MED ORDER — AMLODIPINE BESYLATE 2.5 MG PO TABS: 2.5000 mg | ORAL_TABLET | Freq: Every day | ORAL | 2 refills | Status: DC

## 2022-05-24 MED ORDER — FLUOXETINE HCL 20 MG PO CAPS: 60.0000 mg | ORAL_CAPSULE | Freq: Every day | ORAL | 1 refills | Status: DC

## 2022-05-24 NOTE — Progress Notes (Signed)
Patient ID: Becky Mccoy, female   DOB: August 28, 1985, 36 y.o.   MRN: [id number]   Subjective:    Patient ID: Becky Mccoy, female    DOB: 1986-03-19, 37 y.o.   MRN: [id number]   Patient here for  Chief Complaint  Patient presents with   Follow-up    8 week follow up   .   HPI Here to follow up regarding her blood pressure and previous headaches.   Also history of depression/OCD. On prozac. Regarding stress - overall stable.  Blood pressure doing better and under good control on amlodipine - started last visit.  No headaches.  She does report some increased back pain - persistent mid back pain.  Has been present for the last few weeks.  She did fall two weeks ago.  Fell on her knees.  Denies back injury.  Pain before.  Pain appears to be worse with certain positions - leaning forward.  Also described a pulling sensation - when urinating - groin - noticed previously.  Not present now.  No hematuria.  No vaginal problems.  Denies possibility of being pregnant.  No dysuria. Has meloxicam and flexeril.  Not helping.  Some radiation to flank.    Past Medical History:  Diagnosis Date   COVID-19    04/2021 11/202   History of chicken pox    Hx: UTI (urinary tract infection)    Obsessive-compulsive and related disorder    OCD (obsessive compulsive disorder)    Past Surgical History:  Procedure Laterality Date   WISDOM TOOTH EXTRACTION     Family History  Problem Relation Age of Onset   Healthy Mother    Healthy Father    Cancer - Other Maternal Grandfather        stomach cancer   Cancer - Other Paternal Grandfather        Bone cancer   Social History   Socioeconomic History   Marital status: Married    Spouse name: Not on file   Number of children: Not on file   Years of education: Not on file   Highest education level: Not on file  Occupational History   Not on file  Tobacco Use   Smoking status: Never   Smokeless tobacco: Never  Vaping Use   Vaping Use: Never used   Substance and Sexual Activity   Alcohol use: No    Alcohol/week: 0.0 standard drinks of alcohol   Drug use: No   Sexual activity: Yes    Birth control/protection: None    Comment: Vasectomy  Other Topics Concern   Not on file  Social History Narrative   Not on file   Social Determinants of Health   Financial Resource Strain: Not on file  Food Insecurity: Not on file  Transportation Needs: Not on file  Physical Activity: Not on file  Stress: Not on file  Social Connections: Not on file     Review of Systems  Constitutional:  Negative for appetite change and fever.  HENT:  Negative for congestion and sinus pressure.   Respiratory:  Negative for cough, chest tightness and shortness of breath.   Cardiovascular:  Negative for chest pain, palpitations and leg swelling.  Gastrointestinal:  Negative for abdominal pain, diarrhea, nausea and vomiting.  Genitourinary:  Negative for difficulty urinating, dysuria and hematuria.  Musculoskeletal:  Positive for back pain. Negative for joint swelling and myalgias.  Skin:  Negative for color change and rash.  Neurological:  Negative for dizziness, light-headedness and  headaches.  Psychiatric/Behavioral:  Negative for agitation and dysphoric mood.        Objective:     BP 108/70 (BP Location: Left Arm, Patient Position: Sitting, Cuff Size: Normal)   Pulse (!) 101   Temp 99.2 F (37.3 C) (Oral)   Ht _0  (1.676 m)   Wt 170 lb 3.2 oz (77.2 kg)   SpO2 97%   BMI 27.47 kg/m  Wt Readings from Last 3 Encounters:  05/25/22 170 lb 3.1 oz (77.2 kg)  05/24/22 170 lb 3.2 oz (77.2 kg)  03/23/22 168 lb 12.8 oz (76.6 kg)    Physical Exam Vitals reviewed.  Constitutional:      General: She is not in acute distress.    Appearance: Normal appearance.  HENT:     Head: Normocephalic and atraumatic.     Right Ear: External ear normal.     Left Ear: External ear normal.  Eyes:     General: No scleral icterus.       Right eye: No  discharge.        Left eye: No discharge.     Conjunctiva/sclera: Conjunctivae normal.  Neck:     Thyroid: No thyromegaly.  Cardiovascular:     Rate and Rhythm: Normal rate and regular rhythm.  Pulmonary:     Effort: No respiratory distress.     Breath sounds: Normal breath sounds. No wheezing.  Abdominal:     General: Bowel sounds are normal.     Palpations: Abdomen is soft.     Tenderness: There is no abdominal tenderness.  Musculoskeletal:        General: No swelling.     Cervical back: Neck supple. No tenderness.     Comments: Increased pain - mid back with certain movements and bending.   Lymphadenopathy:     Cervical: No cervical adenopathy.  Skin:    Findings: No erythema or rash.  Neurological:     Mental Status: She is alert.  Psychiatric:        Mood and Affect: Mood normal.        Behavior: Behavior normal.      Outpatient Encounter Medications as of 05/24/2022  Medication Sig   amLODipine (NORVASC) 2.5 MG tablet Take 1 tablet (2.5 mg total) by mouth daily.   FLUoxetine (PROZAC) 20 MG capsule Take 3 capsules (60 mg total) by mouth daily.   methylPREDNISolone (MEDROL DOSEPAK) 4 MG TBPK tablet Medrol dosepak 6 day taper.  Take as directed.   [DISCONTINUED] amLODipine (NORVASC) 2.5 MG tablet TAKE 1 TABLET BY MOUTH EVERY DAY   [DISCONTINUED] FLUoxetine (PROZAC) 20 MG capsule Take 3 capsules (60 mg total) by mouth daily.   No facility-administered encounter medications on file as of 05/24/2022.     Lab Results  Component Value Date   WBC 9.1 05/24/2022   HGB 14.2 05/24/2022   HCT 41.8 05/24/2022   PLT 323.0 05/24/2022   GLUCOSE 86 05/24/2022   CHOL 237 (H) 04/20/2022   TRIG (H) 04/20/2022    454.0 Triglyceride is over 400; calculations on Lipids are invalid.   HDL 37.40 (L) 04/20/2022   LDLDIRECT 135.0 04/20/2022   LDLCALC 152 (H) 09/19/2018   ALT 15 04/20/2022   AST 15 04/20/2022   NA 136 05/24/2022   K 4.2 05/24/2022   CL 102 05/24/2022   CREATININE  0.62 05/24/2022   BUN 17 05/24/2022   CO2 24 05/24/2022   TSH 1.28 01/10/2022    CT ABDOMEN PELVIS W CONTRAST  Result Date: 12/13/2021 CLINICAL DATA:  Abdominal pain and diarrhea for 1 month. EXAM: CT ABDOMEN AND PELVIS WITH CONTRAST TECHNIQUE: Multidetector CT imaging of the abdomen and pelvis was performed using the standard protocol following bolus administration of intravenous contrast. RADIATION DOSE REDUCTION: This exam was performed according to the departmental dose-optimization program which includes automated exposure control, adjustment of the mA and/or kV according to patient size and/or use of iterative reconstruction technique. CONTRAST:  171m OMNIPAQUE IOHEXOL 300 MG/ML  SOLN COMPARISON:  None Available. FINDINGS: Lower Chest: No acute findings. Hepatobiliary: No hepatic masses identified. Mild-to-moderate diffuse hepatic steatosis. Gallbladder is unremarkable. No evidence of biliary ductal dilatation. Pancreas:  No mass or inflammatory changes. Spleen: Within normal limits in size and appearance. Adrenals/Urinary Tract: No masses identified. Small benign-appearing bilateral renal cysts noted (no followup imaging is recommended). No evidence of ureteral calculi or hydronephrosis. Stomach/Bowel: Colon is nondistended which limits evaluation, but mild long segment wall thickening is suspected involving the rectosigmoid colon. This is suspicious for proctocolitis. No evidence of bowel obstruction. No other inflammatory process or abnormal fluid collections identified. Vascular/Lymphatic: No pathologically enlarged lymph nodes. No acute vascular findings. Reproductive:  No mass or other significant abnormality. Other:  None. Musculoskeletal:  No suspicious bone lesions identified. IMPRESSION: Suspect mild proctocolitis. Hepatic steatosis. Electronically Signed   By: JMarlaine HindM.D.   On: 12/13/2021 16:05       Assessment & Plan:   Problem List Items Addressed This Visit     Back pain     Mid back pain with some radiation to flank.  Previously noticed pulling sensation in groin. Does not notice this now.  No hematuria.  No change with urination, specifically denies dysuria, increased frequency, etc.  No vaginal problems.  Did have recent fall.  Feels back pain started prior.  On exam, pain appears to be more msk.  Check thoracic spine xray.  Already on mr and antiinflammatories.  Medrol dosepak as directed.  Check urine and cbc/met b.  Discussed the possibility of kidney stone.  Hold on scanning.  Monitor urine.  Stay hydrated.        Relevant Medications   methylPREDNISolone (MEDROL DOSEPAK) 4 MG TBPK tablet   Other Relevant Orders   Urinalysis, Routine w reflex microscopic (Completed)   POCT urine pregnancy (Completed)   DG Thoracic Spine 2 View (Completed)   CBC with Differential/Platelet (Completed)   Basic metabolic panel (Completed)   Dep[place], major, recurrent [place]    Doing well on Prozac.  Continue.      Fatty liver    Has seen GI.  Discussed diet/exercise/weight loss.  Follow liver function tests.       Headache    Not an issue now.  Blood pressure under good control.  Continue amlodipine.      Hypercholesterolemia    Low cholesterol diet and exercise.  Follow lipid panel.      Hypertension, essential - Primary    On amlodipine.  Blood pressure doing better. Follow pressures.  Follow metabolic panel.       OCD (obsessive compulsive disorder)    Continues on prozac.  Overall doing well.  Follow.          CEinar Pheasant MD

## 2022-05-25 ENCOUNTER — Ambulatory Visit (INDEPENDENT_AMBULATORY_CARE_PROVIDER_SITE_OTHER): Payer: BC Managed Care – PPO

## 2022-05-25 ENCOUNTER — Ambulatory Visit
Admission: EM | Admit: 2022-05-25 | Discharge: 2022-05-25 | Disposition: A | Discharge status: Home / Self Care | Payer: BC Managed Care – PPO | Attending: Family Medicine | Admitting: Family Medicine

## 2022-05-25 ENCOUNTER — Encounter: Payer: Self-pay | Admitting: Internal Medicine

## 2022-05-25 ENCOUNTER — Encounter: Payer: Self-pay | Admitting: Emergency Medicine

## 2022-05-25 DIAGNOSIS — R109 Unspecified abdominal pain: Secondary | ICD-10-CM

## 2022-05-25 DIAGNOSIS — K760 Fatty (change of) liver, not elsewhere classified: Secondary | ICD-10-CM | POA: Diagnosis not present

## 2022-05-25 DIAGNOSIS — R160 Hepatomegaly, not elsewhere classified: Secondary | ICD-10-CM | POA: Diagnosis not present

## 2022-05-25 LAB — URINALYSIS, ROUTINE W REFLEX MICROSCOPIC
Bilirubin Urine: NEGATIVE
Glucose, UA: NEGATIVE mg/dL
Ketones, ur: NEGATIVE mg/dL
Nitrite: NEGATIVE
Protein, ur: NEGATIVE mg/dL
Specific Gravity, Urine: 1.02 (ref 1.005–1.030)
pH: 6.5 (ref 5.0–8.0)

## 2022-05-25 LAB — URINALYSIS, MICROSCOPIC (REFLEX)

## 2022-05-25 MED ORDER — HYDROCODONE-ACETAMINOPHEN 5-325 MG PO TABS: 2.0000 | ORAL_TABLET | ORAL | 0 refills | Status: DC | PRN

## 2022-05-25 MED ORDER — BACLOFEN 10 MG PO TABS: 10.0000 mg | ORAL_TABLET | Freq: Three times a day (TID) | ORAL | 0 refills | Status: DC

## 2022-05-25 MED ORDER — KETOROLAC TROMETHAMINE 60 MG/2ML IM SOLN
60.0000 mg | Freq: Once | INTRAMUSCULAR | Status: AC
  Administered 2022-05-25: 60 mg via INTRAMUSCULAR

## 2022-05-25 NOTE — Telephone Encounter (Signed)
Did she notice vaginal bleeding or blood in her urine or rectal bleeding?  Any other new symptoms? Eating? Vomiting?

## 2022-05-25 NOTE — Telephone Encounter (Signed)
I called patient & she was willing to go to [facility] for revaluation in case CT needs to be done today. She will keep [place] updated on how she is doing.

## 2022-05-25 NOTE — Telephone Encounter (Signed)
Spoke with pt and she stated that she is having vaginal spotting that is brown mucus like. She stated that she is not supposed to start her menstrual cycle until October 17th. She stated that the lower back pain has increased and she has started having some abdominal cramping and waves of nausea. No vomiting and changes in her eating habits, she states that she just doesn't feel like eating. She also stated that the lower back pain has increased with any movement but more so when leaning forward. Pt was at [facility] getting the prednisone that was called in for her.

## 2022-05-25 NOTE — ED Triage Notes (Signed)
Pt c/o bilateral flank pain. R>L. Started about 3 weeks ago. She states the pain radiates around to the front of her abdomen. She states this morning she saw 2 brown grains of salt in her urine.

## 2022-05-25 NOTE — Telephone Encounter (Signed)
With the change in symptoms, I would recommend reevaluation.  Yesterday was more msk type symptoms.  Given this change and now abdominal pain, etc - hold on medrol dose pak.  I feel she needs to be reevaluated and may need to have CT scan, etc.  Recommend evaluation today.

## 2022-05-25 NOTE — ED Notes (Signed)
Auth # [id number] valid 05/25/22-06/23/22

## 2022-05-25 NOTE — Telephone Encounter (Signed)
Patient called stating she is in a lot of lower back pain and she saw a little blood in her urine this morning. Wanted to see if the lab results were back.

## 2022-05-25 NOTE — ED Provider Notes (Signed)
[facility]    CSN: [id number] Arrival date & time: 05/25/22  1536      History   Chief Complaint Chief Complaint  Patient presents with   Flank Pain    HPI Becky Mccoy is a 36 y.o. female.   HPI  36 year old female here for evaluation of flank pain.  Patient reports that she has been experiencing pain in both of her flanks for the last 3 weeks.  Her right is worse than her left.  She states that the pain had been stable and that it increased today and she is rating it a 6-7/10.  Today has also been associated with waxing and waning nausea.  She denies any pain with urination, urinary urgency or frequency, or hematuria.  She states that this morning when she wiped she saw 2 small brown specks that were the size of salt drains.  She denies any personal history of kidney stones though there is a familial history of kidney stones.  The patient saw her PCP yesterday and had a urinalysis, BMP, CBC, urine pregnancy, and x-rays of her thoracic spine performed.  She was discharged home on a Medrol Dosepak.  She says she has been taking the steroids, naproxen, and meloxicam without any improvement of symptoms.  She is also used Flexeril without any improvement of her symptoms.  Past Medical History:  Diagnosis Date   COVID-19    04/2021 11/202   History of chicken pox    Hx: UTI (urinary tract infection)    Obsessive-compulsive and related disorder    OCD (obsessive compulsive disorder)     Patient Active Problem List   Diagnosis Date Noted   Back pain 05/24/2022   Acid reflux 04/02/2022   Fatty liver 04/02/2022   Eosinophilia 04/02/2022   Headache 02/27/2022   Hypertension, essential 02/27/2022   Colitis 12/12/2021   Depression, major, recurrent (HCC) 04/18/2021   Weight gain 04/18/2021   Breast feeding status of mother 10/27/2020   Vaginal discharge 10/17/2020   Leg skin lesion, right 10/17/2020   Premature rupture of membranes (PROM) affecting fourth  pregnancy 08/29/2019   Normal vaginal delivery 08/29/2019   Postpartum care following vaginal delivery 08/29/2019   Encounter for supervision of low-risk pregnancy, antepartum 01/09/2019   Hypercholesterolemia 06/18/2018   Nipple discharge 12/11/2016   Chest pain 12/11/2016   Health care maintenance 02/25/2015   Thyroid fullness 06/07/2014   OCD (obsessive compulsive disorder) 06/07/2014    Past Surgical History:  Procedure Laterality Date   WISDOM TOOTH EXTRACTION      OB History     Gravida  4   Para  3   Term  3   Preterm      AB  1   Living  3      SAB  1   IAB      Ectopic      Multiple  0   Live Births  1            Home Medications    Prior to Admission medications   Medication Sig Start Date End Date Taking? Authorizing Provider  amLODipine (NORVASC) 2.5 MG tablet Take 1 tablet (2.5 mg total) by mouth daily. 05/24/22  Yes Dale Lyndon, MD  baclofen (LIORESAL) 10 MG tablet Take 1 tablet (10 mg total) by mouth 3 (three) times daily. 05/25/22  Yes Becky Augusta, NP  FLUoxetine (PROZAC) 20 MG capsule Take 3 capsules (60 mg total) by mouth daily. 05/24/22  Yes Scott,  Charlene, MD  HYDROcodone-acetaminophen (NORCO/VICODIN) 5-325 MG tablet Take 2 tablets by mouth every 4 (four) hours as needed. 05/25/22  Yes Becky Augustayan, Isabellamarie Randa, NP  methylPREDNISolone (MEDROL DOSEPAK) 4 MG TBPK tablet Medrol dosepak 6 day taper.  Take as directed. 05/24/22   Dale DurhamScott, Charlene, MD    Family History Family History  Problem Relation Age of Onset   Healthy Mother    Healthy Father    Cancer - Other Maternal Grandfather        stomach cancer   Cancer - Other Paternal Grandfather        Bone cancer    Social History Social History   Tobacco Use   Smoking status: Never   Smokeless tobacco: Never  Vaping Use   Vaping Use: Never used  Substance Use Topics   Alcohol use: No    Alcohol/week: 0.0 standard drinks of alcohol   Drug use: No     Allergies   Sulfa  antibiotics   Review of Systems Review of Systems  Constitutional:  Negative for fever.  Gastrointestinal:  Positive for nausea.  Genitourinary:  Positive for flank pain. Negative for dysuria, frequency, hematuria and urgency.  Musculoskeletal:  Positive for back pain.  Hematological: Negative.   Psychiatric/Behavioral: Negative.       Physical Exam Triage Vital Signs ED Triage Vitals  Enc Vitals Group     BP 05/25/22 1553 (!) 126/98     Pulse Rate 05/25/22 1553 84     Resp 05/25/22 1553 16     Temp 05/25/22 1553 99.4 F (37.4 C)     Temp Source 05/25/22 1553 Oral     SpO2 05/25/22 1553 100 %     Weight 05/25/22 1551 170 lb 3.1 oz (77.2 kg)     Height 05/25/22 1551 5\' 6"  (1.676 m)     Head Circumference --      Peak Flow --      Pain Score 05/25/22 1550 7     Pain Loc --      Pain Edu? --      Excl. in GC? --    No data found.  Updated Vital Signs BP (!) 126/98 (BP Location: Left Arm)   Pulse 84   Temp 99.4 F (37.4 C) (Oral)   Resp 16   Ht 5\' 6"  (1.676 m)   Wt 170 lb 3.1 oz (77.2 kg)   LMP 04/30/2022 (Approximate) Comment: neg preg test 05/24/22  SpO2 100%   Breastfeeding No   BMI 27.47 kg/m   Visual Acuity Right Eye Distance:   Left Eye Distance:   Bilateral Distance:    Right Eye Near:   Left Eye Near:    Bilateral Near:     Physical Exam Vitals and nursing note reviewed.  Constitutional:      Appearance: Normal appearance. She is ill-appearing.  HENT:     Head: Normocephalic and atraumatic.  Cardiovascular:     Rate and Rhythm: Normal rate and regular rhythm.     Pulses: Normal pulses.     Heart sounds: Normal heart sounds. No murmur heard.    No friction rub. No gallop.  Pulmonary:     Effort: Pulmonary effort is normal.     Breath sounds: Normal breath sounds. No wheezing, rhonchi or rales.  Abdominal:     Tenderness: There is no right CVA tenderness or left CVA tenderness.  Musculoskeletal:        General: No swelling or tenderness.  Normal range of motion.  Comments: Patient states that forward flexion and deep respiration increase her pain in her back but there is no pain with palpation of the thoracic or lumbar paraspinous region bilaterally.  Also no CVA tenderness on exam.  No tenderness with palpation of the spinous processes of the thoracic or lumbar spine.  No step-offs.  Skin:    General: Skin is warm and dry.     Capillary Refill: Capillary refill takes less than 2 seconds.     Findings: No erythema or rash.  Neurological:     General: No focal deficit present.     Mental Status: She is alert and oriented to person, place, and time.  Psychiatric:        Mood and Affect: Mood normal.        Behavior: Behavior normal.        Thought Content: Thought content normal.        Judgment: Judgment normal.      UC Treatments / Results  Labs (all labs ordered are listed, but only abnormal results are displayed) Labs Reviewed  URINALYSIS, ROUTINE W REFLEX MICROSCOPIC - Abnormal; Notable for the following components:      Result Value   Hgb urine dipstick TRACE (*)    Leukocytes,Ua TRACE (*)    All other components within normal limits  URINALYSIS, MICROSCOPIC (REFLEX) - Abnormal; Notable for the following components:   Bacteria, UA FEW (*)    All other components within normal limits    EKG   Radiology CT Renal Stone Study  Result Date: 05/25/2022 CLINICAL DATA:  Right-greater-than-left flank pain x3 weeks. EXAM: CT ABDOMEN AND PELVIS WITHOUT CONTRAST TECHNIQUE: Multidetector CT imaging of the abdomen and pelvis was performed following the standard protocol without IV contrast. RADIATION DOSE REDUCTION: This exam was performed according to the departmental dose-optimization program which includes automated exposure control, adjustment of the mA and/or kV according to patient size and/or use of iterative reconstruction technique. COMPARISON:  Radiograph Dec 13, 2021 FINDINGS: Lower chest: No acute  abnormality. Hepatobiliary: Hepatomegaly with diffuse hepatic steatosis and focal areas of fatty sparing. Gallbladder is decompressed. No biliary ductal dilation. Pancreas: No pancreatic ductal dilation or evidence of acute inflammation. Spleen: No splenomegaly Adrenals/Urinary Tract: Bilateral adrenal glands appear normal. No hydronephrosis. No renal, ureteral or bladder calculi identified. Stomach/Bowel: No radiopaque enteric contrast material was administered. Stomach is unremarkable for degree of distension. No pathologic dilation of small or large bowel. Appendix and terminal ileum appear normal. No evidence of acute bowel inflammation. Vascular/Lymphatic: Normal caliber abdominal aorta. No pathologically enlarged abdominal or pelvic lymph nodes Reproductive: Uterus and bilateral adnexa are unremarkable. Other: No significant abdominopelvic free fluid. Musculoskeletal: No acute osseous abnormality IMPRESSION: 1. No acute abnormality in the abdomen or pelvis. Specifically, no evidence of obstructive uropathy. 2. Hepatomegaly with diffuse hepatic steatosis which in the setting of steatohepatitis can be a cause of abdominal pain. Electronically Signed   By: Maudry Mayhew M.D.   On: 05/25/2022 17:56   DG Thoracic Spine 2 View  Result Date: 05/25/2022 CLINICAL DATA:  Persistent back pain. EXAM: THORACIC SPINE 2 VIEWS COMPARISON:  None Available. FINDINGS: There is no evidence of thoracic spine fracture. Alignment is normal. No other significant bone abnormalities are identified. IMPRESSION: Negative. Electronically Signed   By: Norva Pavlov M.D.   On: 05/25/2022 16:11    Procedures Procedures (including critical care time)  Medications Ordered in UC Medications  ketorolac (TORADOL) injection 60 mg (60 mg Intramuscular Given 05/25/22 1711)  Initial Impression / Assessment and Plan / UC Course  I have reviewed the triage vital signs and the nursing notes.  Pertinent labs & imaging results that  were available during my care of the patient were reviewed by me and considered in my medical decision making (see chart for details).   Patient is a nontoxic-appearing 36 year old female here for evaluation of bilateral flank pain has been going on for last 3 weeks as outlined in HPI above.  The pain is exacerbated by deep respirations and also by forward flexion but is not increased with lateral rotation or with palpation of the paraspinous muscle groups of her thoracic or lumbar spine.  No CVA tenderness on exam.  She was evaluated by her PCP yesterday and had lab work and imaging performed.  The thoracic spine films have not been read by radiology but I do not visualize any acute abnormalities.  Patient is CBC and a BMP performed yesterday at her PCPs office with no abnormalities.  Her renal function is normal with a BUN of 17 and a creatinine of 0.62.  White count is normal at 9.1.  Patient's urine pregnancy test was negative.  Urinalysis showed trace hemoglobin and trace leukocyte esterase along with few bacteria.  Otherwise it was unremarkable.  Repeat urinalysis was collected at triage and shows similar results to her urinalysis from her PCPs office yesterday.  Given patient's continued pain I am concerned for possible renal stone as she has had trace blood but no definitive signs of infection.  The pain is not relieved by muscle relaxer's or NSAIDs.  Also no relief with steroids.  I will order a dose of Toradol here in clinic and order a CT renal stone protocol to evaluate for possible kidney stones causing the patient's flank pain.  Radiology impression of CT renal stone protocol states no acute abnormality of the abdomen or pelvis.  Specifically, no evidence of obstructive uropathy.  Hepatomegaly with diffuse hepatic steatosis which in the setting of steatohepatitis can be a cause of abdominal pain.  Patient's urinalysis shows no evidence of infection and the CT scan does not show any evidence of  renal stone or acute pathology in the abdomen and pelvis.  I will have patient stop her naproxen and start the steroid Dosepak that she was prescribed yesterday.  I will also switch her muscle laxer to baclofen to see if this improves her symptoms at all.  I will see the patient in short course of Norco that she can use at nighttime and for severe pain as needed.  Patient has no open prescription through PDMP at present.   Final Clinical Impressions(s) / UC Diagnoses   Final diagnoses:  Flank pain     Discharge Instructions      Your urinalysis did not show the presence of an infection and the CT scan did not show any obvious cause of the pain you are experiencing.  Stop taking the naproxen and start the Medrol Dosepak that was prescribed by your PCP yesterday.  Start this in the morning.  Use the baclofen every 8 hours to help with muscle pain.  This would not sedate you.  Use the Norco every 4-6 hours as needed for severe pain.  Be mindful this medication will sedate you so do not drink alcohol or drive if you take it.  If your symptoms continue I recommend following up with your primary care provider for further work-up.     ED Prescriptions  Medication Sig Dispense Auth. Provider   baclofen (LIORESAL) 10 MG tablet Take 1 tablet (10 mg total) by mouth 3 (three) times daily. 30 each Margarette Canada, NP   HYDROcodone-acetaminophen (NORCO/VICODIN) 5-325 MG tablet Take 2 tablets by mouth every 4 (four) hours as needed. 6 tablet Margarette Canada, NP      I have reviewed the PDMP during this encounter.   Margarette Canada, NP 05/25/22 1811

## 2022-05-25 NOTE — Discharge Instructions (Addendum)
Your urinalysis did not show the presence of an infection and the CT scan did not show any obvious cause of the pain you are experiencing.  Stop taking the naproxen and start the Medrol Dosepak that was prescribed by your PCP yesterday.  Start this in the morning.  Use the baclofen every 8 hours to help with muscle pain.  This would not sedate you.  Use the Norco every 4-6 hours as needed for severe pain.  Be mindful this medication will sedate you so do not drink alcohol or drive if you take it.  If your symptoms continue I recommend following up with your primary care provider for further work-up.

## 2022-05-26 ENCOUNTER — Telehealth: Payer: Self-pay

## 2022-05-26 NOTE — Telephone Encounter (Signed)
LMTCB to see how patient is doing as she was supposed to evaluated yesterday by UC. She has seen xray results via mychart.

## 2022-05-28 ENCOUNTER — Encounter: Payer: Self-pay | Admitting: Internal Medicine

## 2022-05-28 NOTE — Assessment & Plan Note (Signed)
Mid back pain with some radiation to flank.  Previously noticed pulling sensation in groin. Does not notice this now.  No hematuria.  No change with urination, specifically denies dysuria, increased frequency, etc.  No vaginal problems.  Did have recent fall.  Feels back pain started prior.  On exam, pain appears to be more msk.  Check thoracic spine xray.  Already on mr and antiinflammatories.  Medrol dosepak as directed.  Check urine and cbc/met b.  Discussed the possibility of kidney stone.  Hold on scanning.  Monitor urine.  Stay hydrated.

## 2022-05-28 NOTE — Assessment & Plan Note (Signed)
On amlodipine.  Blood pressure doing better. Follow pressures.  Follow metabolic panel.

## 2022-05-28 NOTE — Assessment & Plan Note (Signed)
Doing well on Prozac.  Continue.

## 2022-05-28 NOTE — Assessment & Plan Note (Signed)
Has seen GI.  Discussed diet/exercise/weight loss.  Follow liver function tests.

## 2022-05-28 NOTE — Assessment & Plan Note (Signed)
Continues on prozac.  Overall doing well.  Follow.

## 2022-05-28 NOTE — Assessment & Plan Note (Signed)
Not an issue now.  Blood pressure under good control.  Continue amlodipine.

## 2022-05-28 NOTE — Assessment & Plan Note (Signed)
Low cholesterol diet and exercise.  Follow lipid panel.   

## 2022-06-28 ENCOUNTER — Telehealth: Payer: Self-pay | Admitting: Internal Medicine

## 2022-06-28 DIAGNOSIS — F439 Reaction to severe stress, unspecified: Secondary | ICD-10-CM

## 2022-06-28 DIAGNOSIS — F429 Obsessive-compulsive disorder, unspecified: Secondary | ICD-10-CM

## 2022-06-28 NOTE — Telephone Encounter (Signed)
Pt came into the office to drop off medical leave forms. Placed in provider folder. Pt would like to be called in regards to the forms

## 2022-06-29 ENCOUNTER — Telehealth: Payer: Self-pay

## 2022-06-29 NOTE — Telephone Encounter (Signed)
Order placed for psychiatry referral to Dr Kapur.  

## 2022-06-29 NOTE — Telephone Encounter (Signed)
S/w Becky Mccoy - stated experiencing extreme burnout at work. Having trouble focusing, preforming tasks, and experiencing very high levels of stress/anxiety.  Pt states she is experiencing an increase in OCD symptoms as well, making it difficult to perform daily tasks.  Would like FMLA papers filled out for a leave of absence from work of 1 month. Would also like a referral to new psychiatrist and new therapist so she can receive some support during this time.  Will send pt mychart message with Therapist recommendations.

## 2022-06-29 NOTE — Telephone Encounter (Signed)
Order placed to psychiatry referral to Dr Maryruth Bun.  Can schedule an appt with me if needs to discuss.  Confirm no suicidal ideations.

## 2022-06-29 NOTE — Telephone Encounter (Signed)
She had been seeing psychiatry and in July asked if I would prescribe her medication since stable and did not feel needed f/u with psychiatry.  Does she want to return to her previous psychiatrist?  If no, where does she prefer to go or need to know if has a preference of who she sees.  Also, will need psychiatry input regarding her FMLA, treatment ,etc.

## 2022-06-29 NOTE — Telephone Encounter (Signed)
Mychart message sent to pt w/ therapist recommendations

## 2022-06-29 NOTE — Telephone Encounter (Signed)
Pt advised referral put in, also advised need psych input for FMLA, treatment, etc.  Pt asking if we can fill out paperwork then for 2 weeks so she can have a break and get scheduled with psych.

## 2022-06-30 NOTE — Telephone Encounter (Signed)
Pt sched

## 2022-06-30 NOTE — Telephone Encounter (Signed)
Appt 1:00 on Tuesday - 07/04/22  - to discuss.

## 2022-07-04 ENCOUNTER — Encounter: Payer: Self-pay | Admitting: Internal Medicine

## 2022-07-04 ENCOUNTER — Ambulatory Visit (INDEPENDENT_AMBULATORY_CARE_PROVIDER_SITE_OTHER): Payer: BC Managed Care – PPO | Admitting: Internal Medicine

## 2022-07-04 VITALS — BP 140/88 | HR 96 | Temp 98.5°F | Resp 14 | Ht 66.0 in | Wt 171.2 lb

## 2022-07-04 DIAGNOSIS — I10 Essential (primary) hypertension: Secondary | ICD-10-CM | POA: Diagnosis not present

## 2022-07-04 DIAGNOSIS — F429 Obsessive-compulsive disorder, unspecified: Secondary | ICD-10-CM

## 2022-07-04 DIAGNOSIS — F339 Major depressive disorder, recurrent, unspecified: Secondary | ICD-10-CM

## 2022-07-04 DIAGNOSIS — F419 Anxiety disorder, unspecified: Secondary | ICD-10-CM

## 2022-07-04 MED ORDER — VENLAFAXINE HCL ER 75 MG PO CP24: 75.0000 mg | ORAL_CAPSULE | Freq: Every day | ORAL | 1 refills | Status: DC

## 2022-07-04 NOTE — Progress Notes (Signed)
Patient ID: Becky Mccoy, female   DOB: 1985/11/08, 36 y.o.   MRN: [id number]   Subjective:    Patient ID: Becky Mccoy, female    DOB: [birth year], 36 y.o.   MRN: [id number]   Patient here for work in appt. Marland Kitchen   HPI Here as a work in appt to discuss increased stress and anxiety as well as increased ocd.  Has been working Friday through Sunday - at [employer].  Reports increased stress and anxiety at work (and home as well).  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  States has a hard time working and focusing on pts.  States will get something on her mind and only can focus on that issue.  Also worrying if she might do something wrong or forget to do something, which is affecting her job.  When at home - states all she wants to do is sleep.  Describes "no motivation".  Having trouble sleeping at night.  Some nausea and diarrhea - intermittent - relates to increased stress.  Is on prozac and this has worked well for her.  When off, felt more anxious and OCD was worse.  Has been on zoloft previously - 250mg .  Brain felt foggy on this dose.     Past Medical History:  Diagnosis Date   COVID-19    04/2021 11/202   History of chicken pox    Hx: UTI (urinary tract infection)    Obsessive-compulsive and related disorder    OCD (obsessive compulsive disorder)    Past Surgical History:  Procedure Laterality Date   WISDOM TOOTH EXTRACTION     Family History  Problem Relation Age of Onset   Healthy Mother    Healthy Father    Cancer - Other Maternal Grandfather        stomach cancer   Cancer - Other Paternal Grandfather        Bone cancer   Social History   Socioeconomic History   Marital status: Married    Spouse name: Not on file   Number of children: Not on file   Years of education: Not on file   Highest education level: Not on file  Occupational History   Not on file  Tobacco Use   Smoking status: Never   Smokeless tobacco: Never  Vaping Use   Vaping  Use: Never used  Substance and Sexual Activity   Alcohol use: No    Alcohol/week: 0.0 standard drinks of alcohol   Drug use: No   Sexual activity: Yes    Birth control/protection: None    Comment: Vasectomy  Other Topics Concern   Not on file  Social History Narrative   Not on file   Social Determinants of Health   Financial Resource Strain: Not on file  Food Insecurity: Not on file  Transportation Needs: Not on file  Physical Activity: Not on file  Stress: Not on file  Social Connections: Not on file     Review of Systems  Constitutional:  Negative for appetite change and unexpected weight change.  HENT:  Negative for congestion and sinus pressure.   Respiratory:  Negative for cough, chest tightness and shortness of breath.   Cardiovascular:  Negative for chest pain, palpitations and leg swelling.  Gastrointestinal:  Negative for abdominal pain, diarrhea, nausea and vomiting.  Genitourinary:  Negative for difficulty urinating and dysuria.  Musculoskeletal:  Negative for joint swelling and myalgias.  Skin:  Negative for color change and rash.  Neurological:  Negative for dizziness and headaches.  Psychiatric/Behavioral:         Increased stress.  Anxiety.         Objective:     BP (!) 140/88 (BP Location: Left Arm, Patient Position: Sitting, Cuff Size: Large)   Pulse 96   Temp 98.5 F (36.9 C) (Temporal)   Resp 14   Ht 5\' 6"  (1.676 m)   Wt 171 lb 3.2 oz (77.7 kg)   SpO2 98%   BMI 27.63 kg/m  Wt Readings from Last 3 Encounters:  07/04/22 171 lb 3.2 oz (77.7 kg)  05/25/22 170 lb 3.1 oz (77.2 kg)  05/24/22 170 lb 3.2 oz (77.2 kg)    Physical Exam Vitals reviewed.  Constitutional:      General: She is not in acute distress.    Appearance: Normal appearance.  HENT:     Head: Normocephalic and atraumatic.     Right Ear: External ear normal.     Left Ear: External ear normal.  Eyes:     General: No scleral icterus.       Right eye: No discharge.         Left eye: No discharge.     Conjunctiva/sclera: Conjunctivae normal.  Neck:     Thyroid: No thyromegaly.  Cardiovascular:     Rate and Rhythm: Normal rate and regular rhythm.  Pulmonary:     Effort: No respiratory distress.     Breath sounds: Normal breath sounds. No wheezing.  Abdominal:     General: Bowel sounds are normal.     Palpations: Abdomen is soft.     Tenderness: There is no abdominal tenderness.  Musculoskeletal:        General: No swelling or tenderness.     Cervical back: Neck supple. No tenderness.  Lymphadenopathy:     Cervical: No cervical adenopathy.  Skin:    Findings: No erythema or rash.  Neurological:     Mental Status: She is alert.  Psychiatric:        Mood and Affect: Mood normal.        Behavior: Behavior normal.      Outpatient Encounter Medications as of 07/04/2022  Medication Sig   amLODipine (NORVASC) 2.5 MG tablet Take 1 tablet (2.5 mg total) by mouth daily.   baclofen (LIORESAL) 10 MG tablet Take 1 tablet (10 mg total) by mouth 3 (three) times daily.   FLUoxetine (PROZAC) 20 MG capsule Take 3 capsules (60 mg total) by mouth daily.   HYDROcodone-acetaminophen (NORCO/VICODIN) 5-325 MG tablet Take 2 tablets by mouth every 4 (four) hours as needed.   venlafaxine XR (EFFEXOR XR) 75 MG 24 hr capsule Take 1 capsule (75 mg total) by mouth daily with breakfast.   [DISCONTINUED] methylPREDNISolone (MEDROL DOSEPAK) 4 MG TBPK tablet Medrol dosepak 6 day taper.  Take as directed.   No facility-administered encounter medications on file as of 07/04/2022.     Lab Results  Component Value Date   WBC 9.1 05/24/2022   HGB 14.2 05/24/2022   HCT 41.8 05/24/2022   PLT 323.0 05/24/2022   GLUCOSE 86 05/24/2022   CHOL 237 (H) 04/20/2022   TRIG (H) 04/20/2022    454.0 Triglyceride is over 400; calculations on Lipids are invalid.   HDL 37.40 (L) 04/20/2022   LDLDIRECT 135.0 04/20/2022   LDLCALC 152 (H) 09/19/2018   ALT 15 04/20/2022   AST 15 04/20/2022    NA 136 05/24/2022   K 4.2 05/24/2022   CL  102 05/24/2022   CREATININE 0.62 05/24/2022   BUN 17 05/24/2022   CO2 24 05/24/2022   TSH 1.28 01/10/2022    CT Renal Stone Study  Result Date: 05/25/2022 CLINICAL DATA:  Right-greater-than-left flank pain x3 weeks. EXAM: CT ABDOMEN AND PELVIS WITHOUT CONTRAST TECHNIQUE: Multidetector CT imaging of the abdomen and pelvis was performed following the standard protocol without IV contrast. RADIATION DOSE REDUCTION: This exam was performed according to the departmental dose-optimization program which includes automated exposure control, adjustment of the mA and/or kV according to patient size and/or use of iterative reconstruction technique. COMPARISON:  Radiograph Dec 13, 2021 FINDINGS: Lower chest: No acute abnormality. Hepatobiliary: Hepatomegaly with diffuse hepatic steatosis and focal areas of fatty sparing. Gallbladder is decompressed. No biliary ductal dilation. Pancreas: No pancreatic ductal dilation or evidence of acute inflammation. Spleen: No splenomegaly Adrenals/Urinary Tract: Bilateral adrenal glands appear normal. No hydronephrosis. No renal, ureteral or bladder calculi identified. Stomach/Bowel: No radiopaque enteric contrast material was administered. Stomach is unremarkable for degree of distension. No pathologic dilation of small or large bowel. Appendix and terminal ileum appear normal. No evidence of acute bowel inflammation. Vascular/Lymphatic: Normal caliber abdominal aorta. No pathologically enlarged abdominal or pelvic lymph nodes Reproductive: Uterus and bilateral adnexa are unremarkable. Other: No significant abdominopelvic free fluid. Musculoskeletal: No acute osseous abnormality IMPRESSION: 1. No acute abnormality in the abdomen or pelvis. Specifically, no evidence of obstructive uropathy. 2. Hepatomegaly with diffuse hepatic steatosis which in the setting of steatohepatitis can be a cause of abdominal pain. Electronically Signed   By:  Dahlia Bailiff M.D.   On: 05/25/2022 17:56       Assessment & Plan:   Problem List Items Addressed This Visit     Anxiety    Increased stress/anxiety as outlined.  On prozac.  Effexor added.  Discussed with psychiatry - earlier appt.  Plans to work in for earlier appt.  Follow pressures since adding effexor.  FMLA form completed.        Relevant Medications   venlafaxine XR (EFFEXOR XR) 75 MG 24 hr capsule   Depression, major, recurrent ([place]) - Primary    Has done well on prozac.  Increased stress and anxiety as outlined.  Some depression.  No SI.  On prozac.  Refer to pschiatry.  Start effexor XR 75mg  q day.  Follow pressures.  Schedule f/u soon to reassess.        Relevant Medications   venlafaxine XR (EFFEXOR XR) 75 MG 24 hr capsule   Hypertension, essential    On amlodipine.  Blood pressure was doing better. Increased pressure today.  Feel related to increased stress/anxiety.  Treat anxiety.  Added effexor.  Follow pressures.  Follow metabolic panel.       OCD (obsessive compulsive disorder)    On prozac.  Increased issues as outlined.  Effexor added.  Referred to psychiatry.  Contacted psychiatry about earlier appt.  Form completed for FMLA.       Relevant Medications   venlafaxine XR (EFFEXOR XR) 75 MG 24 hr capsule     Einar Pheasant, MD

## 2022-07-08 ENCOUNTER — Encounter: Payer: Self-pay | Admitting: Internal Medicine

## 2022-07-08 DIAGNOSIS — F419 Anxiety disorder, unspecified: Secondary | ICD-10-CM | POA: Insufficient documentation

## 2022-07-08 NOTE — Assessment & Plan Note (Addendum)
Has done well on prozac.  Increased stress and anxiety as outlined.  Some depression.  No SI.  On prozac.  Refer to pschiatry.  Start effexor XR 75mg  q day.  Follow pressures.  Schedule f/u soon to reassess.

## 2022-07-08 NOTE — Assessment & Plan Note (Signed)
Increased stress/anxiety as outlined.  On prozac.  Effexor added.  Discussed with psychiatry - earlier appt.  Plans to work in for earlier appt.  Follow pressures since adding effexor.  FMLA form completed.

## 2022-07-08 NOTE — Assessment & Plan Note (Signed)
On amlodipine.  Blood pressure was doing better. Increased pressure today.  Feel related to increased stress/anxiety.  Treat anxiety.  Added effexor.  Follow pressures.  Follow metabolic panel.

## 2022-07-08 NOTE — Assessment & Plan Note (Signed)
On prozac.  Increased issues as outlined.  Effexor added.  Referred to psychiatry.  Contacted psychiatry about earlier appt.  Form completed for FMLA.

## 2022-07-10 DIAGNOSIS — F411 Generalized anxiety disorder: Secondary | ICD-10-CM | POA: Diagnosis not present

## 2022-07-10 DIAGNOSIS — F429 Obsessive-compulsive disorder, unspecified: Secondary | ICD-10-CM | POA: Diagnosis not present

## 2022-07-10 DIAGNOSIS — F5105 Insomnia due to other mental disorder: Secondary | ICD-10-CM | POA: Diagnosis not present

## 2022-07-20 ENCOUNTER — Encounter: Payer: Self-pay | Admitting: Internal Medicine

## 2022-07-20 ENCOUNTER — Ambulatory Visit (INDEPENDENT_AMBULATORY_CARE_PROVIDER_SITE_OTHER): Payer: BC Managed Care – PPO | Admitting: Internal Medicine

## 2022-07-20 VITALS — BP 118/72 | HR 104 | Temp 98.1°F | Resp 16 | Ht 66.0 in | Wt 170.8 lb

## 2022-07-20 DIAGNOSIS — K219 Gastro-esophageal reflux disease without esophagitis: Secondary | ICD-10-CM

## 2022-07-20 DIAGNOSIS — F339 Major depressive disorder, recurrent, unspecified: Secondary | ICD-10-CM | POA: Diagnosis not present

## 2022-07-20 DIAGNOSIS — F419 Anxiety disorder, unspecified: Secondary | ICD-10-CM | POA: Diagnosis not present

## 2022-07-20 DIAGNOSIS — I10 Essential (primary) hypertension: Secondary | ICD-10-CM | POA: Diagnosis not present

## 2022-07-20 DIAGNOSIS — F429 Obsessive-compulsive disorder, unspecified: Secondary | ICD-10-CM

## 2022-07-20 NOTE — Progress Notes (Signed)
Patient ID: Otis Dials, female   DOB: [birth year], 36 y.o.   MRN: [id number]   Subjective:    Patient ID: Otis Dials, female    DOB: 09-30-1985, 36 y.o.   MRN: [id number]   Patient here for a scheduled follow up.  Marland Kitchen   HPI Recently evaluated for increased stress and anxiety as well as ocd.  She was started on effexor - last visit.  Continued prozac.  Referred to psychiatry.  Has seen Dr Maryruth Bun.  She reports that since starting effexor, she does have more energy.  More motivated.  States she feel like she has a little too much energy.  Jumping from one thought to another.  Increased stress.  Dog died.  She was started on trazodone by Dr Maryruth Bun.  Has 1/2ed the dose - due to the whole dose making her groggy.  Doing better on 1/2 50mg  tablet.   Was questioning if needed to decrease effexor.  Is planning to start seeing a therapist next week.  Reports increased acid reflux.  Some constipation.  Used a suppository with good results.     Past Medical History:  Diagnosis Date   COVID-19    04/2021 11/202   History of chicken pox    Hx: UTI (urinary tract infection)    Obsessive-compulsive and related disorder    OCD (obsessive compulsive disorder)    Past Surgical History:  Procedure Laterality Date   WISDOM TOOTH EXTRACTION     Family History  Problem Relation Age of Onset   Healthy Mother    Healthy Father    Cancer - Other Maternal Grandfather        stomach cancer   Cancer - Other Paternal Grandfather        Bone cancer   Social History   Socioeconomic History   Marital status: Married    Spouse name: Not on file   Number of children: Not on file   Years of education: Not on file   Highest education level: Not on file  Occupational History   Not on file  Tobacco Use   Smoking status: Never   Smokeless tobacco: Never  Vaping Use   Vaping Use: Never used  Substance and Sexual Activity   Alcohol use: No    Alcohol/week: 0.0 standard drinks of alcohol   Drug use: No    Sexual activity: Yes    Birth control/protection: None    Comment: Vasectomy  Other Topics Concern   Not on file  Social History Narrative   Not on file   Social Determinants of Health   Financial Resource Strain: Not on file  Food Insecurity: Not on file  Transportation Needs: Not on file  Physical Activity: Not on file  Stress: Not on file  Social Connections: Not on file     Review of Systems  Constitutional:  Negative for appetite change and unexpected weight change.  HENT:  Negative for congestion and sinus pressure.   Respiratory:  Negative for cough, chest tightness and shortness of breath.   Cardiovascular:  Negative for chest pain and leg swelling.  Gastrointestinal:  Negative for abdominal pain, diarrhea, nausea and vomiting.  Genitourinary:  Negative for difficulty urinating and dysuria.  Musculoskeletal:  Negative for joint swelling and myalgias.  Skin:  Negative for color change and rash.  Neurological:  Negative for dizziness and headaches.  Psychiatric/Behavioral:  Negative for agitation and dysphoric mood.        Increased stress and anxiety as outlined.  Objective:     BP 118/72 (BP Location: Left Arm, Patient Position: Sitting, Cuff Size: Large)   Pulse (!) 104   Temp 98.1 F (36.7 C) (Temporal)   Resp 16   Ht 5\' 6"  (1.676 m)   Wt 170 lb 12.8 oz (77.5 kg)   SpO2 98%   BMI 27.57 kg/m  Wt Readings from Last 3 Encounters:  07/20/22 170 lb 12.8 oz (77.5 kg)  07/04/22 171 lb 3.2 oz (77.7 kg)  05/25/22 170 lb 3.1 oz (77.2 kg)    Physical Exam Vitals reviewed.  Constitutional:      General: She is not in acute distress.    Appearance: Normal appearance.  HENT:     Head: Normocephalic and atraumatic.     Right Ear: External ear normal.     Left Ear: External ear normal.  Eyes:     General: No scleral icterus.       Right eye: No discharge.        Left eye: No discharge.     Conjunctiva/sclera: Conjunctivae normal.  Neck:      Thyroid: No thyromegaly.  Cardiovascular:     Rate and Rhythm: Normal rate and regular rhythm.  Pulmonary:     Effort: No respiratory distress.     Breath sounds: Normal breath sounds. No wheezing.  Abdominal:     General: Bowel sounds are normal.     Palpations: Abdomen is soft.     Tenderness: There is no abdominal tenderness.  Musculoskeletal:        General: No swelling or tenderness.     Cervical back: Neck supple. No tenderness.  Lymphadenopathy:     Cervical: No cervical adenopathy.  Skin:    Findings: No erythema or rash.  Neurological:     Mental Status: She is alert.  Psychiatric:        Mood and Affect: Mood normal.        Behavior: Behavior normal.      Outpatient Encounter Medications as of 07/20/2022  Medication Sig   amLODipine (NORVASC) 2.5 MG tablet Take 1 tablet (2.5 mg total) by mouth daily.   FLUoxetine (PROZAC) 20 MG capsule Take 3 capsules (60 mg total) by mouth daily.   traZODone (DESYREL) 50 MG tablet Take 25 mg by mouth at bedtime as needed.   venlafaxine XR (EFFEXOR XR) 75 MG 24 hr capsule Take 1 capsule (75 mg total) by mouth daily with breakfast.   [DISCONTINUED] baclofen (LIORESAL) 10 MG tablet Take 1 tablet (10 mg total) by mouth 3 (three) times daily.   [DISCONTINUED] HYDROcodone-acetaminophen (NORCO/VICODIN) 5-325 MG tablet Take 2 tablets by mouth every 4 (four) hours as needed.   No facility-administered encounter medications on file as of 07/20/2022.     Lab Results  Component Value Date   WBC 9.1 05/24/2022   HGB 14.2 05/24/2022   HCT 41.8 05/24/2022   PLT 323.0 05/24/2022   GLUCOSE 86 05/24/2022   CHOL 237 (H) 04/20/2022   TRIG (H) 04/20/2022    454.0 Triglyceride is over 400; calculations on Lipids are invalid.   HDL 37.40 (L) 04/20/2022   LDLDIRECT 135.0 04/20/2022   LDLCALC 152 (H) 09/19/2018   ALT 15 04/20/2022   AST 15 04/20/2022   NA 136 05/24/2022   K 4.2 05/24/2022   CL 102 05/24/2022   CREATININE 0.62 05/24/2022   BUN  17 05/24/2022   CO2 24 05/24/2022   TSH 1.28 01/10/2022    CT Renal Stone Study  Result Date: 05/25/2022 CLINICAL DATA:  Right-greater-than-left flank pain x3 weeks. EXAM: CT ABDOMEN AND PELVIS WITHOUT CONTRAST TECHNIQUE: Multidetector CT imaging of the abdomen and pelvis was performed following the standard protocol without IV contrast. RADIATION DOSE REDUCTION: This exam was performed according to the departmental dose-optimization program which includes automated exposure control, adjustment of the mA and/or kV according to patient size and/or use of iterative reconstruction technique. COMPARISON:  Radiograph Dec 13, 2021 FINDINGS: Lower chest: No acute abnormality. Hepatobiliary: Hepatomegaly with diffuse hepatic steatosis and focal areas of fatty sparing. Gallbladder is decompressed. No biliary ductal dilation. Pancreas: No pancreatic ductal dilation or evidence of acute inflammation. Spleen: No splenomegaly Adrenals/Urinary Tract: Bilateral adrenal glands appear normal. No hydronephrosis. No renal, ureteral or bladder calculi identified. Stomach/Bowel: No radiopaque enteric contrast material was administered. Stomach is unremarkable for degree of distension. No pathologic dilation of small or large bowel. Appendix and terminal ileum appear normal. No evidence of acute bowel inflammation. Vascular/Lymphatic: Normal caliber abdominal aorta. No pathologically enlarged abdominal or pelvic lymph nodes Reproductive: Uterus and bilateral adnexa are unremarkable. Other: No significant abdominopelvic free fluid. Musculoskeletal: No acute osseous abnormality IMPRESSION: 1. No acute abnormality in the abdomen or pelvis. Specifically, no evidence of obstructive uropathy. 2. Hepatomegaly with diffuse hepatic steatosis which in the setting of steatohepatitis can be a cause of abdominal pain. Electronically Signed   By: Maudry Mayhew M.D.   On: 05/25/2022 17:56       Assessment & Plan:   Problem List Items  Addressed This Visit     Acid reflux - Primary    Increased acid reflux.  Pepcid.  Follow.        Anxiety    Increased stress/anxiety.  On prozac.  Effexor added.  Has more energy.  More motivated.  Feels like she may have a little too much energy.  Discussed today.  Contact psychiatry for medication adjustment and f/u.  Pt comfortable with this plan. Note given to remain out of work until her 08/04/22 appt with psychiatry.       Relevant Medications   traZODone (DESYREL) 50 MG tablet   Depression, major, recurrent ([facility])    On prozac and effexor.  No SI.  F/u with psychiatry as outlined.        Relevant Medications   traZODone (DESYREL) 50 MG tablet   Hypertension, essential    Blood pressure doing better on amlodipine.  Follow pressures.  Follow metabolic panel.       OCD (obsessive compulsive disorder)    On prozac.  Increased issues as outlined.  Effexor added.  Referred to psychiatry.  F/u with psychiatry/counselor as outlined.        Relevant Medications   traZODone (DESYREL) 50 MG tablet     [place], MD

## 2022-07-24 ENCOUNTER — Encounter: Payer: Self-pay | Admitting: Internal Medicine

## 2022-07-24 DIAGNOSIS — F429 Obsessive-compulsive disorder, unspecified: Secondary | ICD-10-CM | POA: Diagnosis not present

## 2022-07-24 DIAGNOSIS — F411 Generalized anxiety disorder: Secondary | ICD-10-CM | POA: Diagnosis not present

## 2022-07-24 NOTE — Assessment & Plan Note (Signed)
Increased acid reflux.  Pepcid.  Follow.

## 2022-07-24 NOTE — Assessment & Plan Note (Signed)
On prozac and effexor.  No SI.  F/u with psychiatry as outlined.

## 2022-07-24 NOTE — Assessment & Plan Note (Addendum)
Increased stress/anxiety.  On prozac.  Effexor added.  Has more energy.  More motivated.  Feels like she may have a little too much energy.  Discussed today.  Contact psychiatry for medication adjustment and f/u.  Pt comfortable with this plan. Note given to remain out of work until her 08/04/22 appt with psychiatry.

## 2022-07-24 NOTE — Assessment & Plan Note (Signed)
On prozac.  Increased issues as outlined.  Effexor added.  Referred to psychiatry.  F/u with psychiatry/counselor as outlined.

## 2022-07-24 NOTE — Assessment & Plan Note (Signed)
Blood pressure doing better on amlodipine.  Follow pressures.  Follow metabolic panel.

## 2022-08-04 DIAGNOSIS — F429 Obsessive-compulsive disorder, unspecified: Secondary | ICD-10-CM | POA: Diagnosis not present

## 2022-08-04 DIAGNOSIS — F411 Generalized anxiety disorder: Secondary | ICD-10-CM | POA: Diagnosis not present

## 2022-08-09 DIAGNOSIS — F411 Generalized anxiety disorder: Secondary | ICD-10-CM | POA: Diagnosis not present

## 2022-08-09 DIAGNOSIS — F5105 Insomnia due to other mental disorder: Secondary | ICD-10-CM | POA: Diagnosis not present

## 2022-08-09 DIAGNOSIS — F429 Obsessive-compulsive disorder, unspecified: Secondary | ICD-10-CM | POA: Diagnosis not present

## 2022-08-23 ENCOUNTER — Telehealth: Payer: BC Managed Care – PPO | Admitting: Family

## 2022-08-23 DIAGNOSIS — J209 Acute bronchitis, unspecified: Secondary | ICD-10-CM | POA: Diagnosis not present

## 2022-08-23 MED ORDER — PROMETHAZINE-DM 6.25-15 MG/5ML PO SYRP: 5.0000 mL | ORAL_SOLUTION | Freq: Three times a day (TID) | ORAL | 0 refills | Status: DC | PRN

## 2022-08-23 MED ORDER — PREDNISONE 20 MG PO TABS: 40.0000 mg | ORAL_TABLET | Freq: Every day | ORAL | 0 refills | Status: AC

## 2022-08-23 MED ORDER — BENZONATATE 100 MG PO CAPS: 100.0000 mg | ORAL_CAPSULE | Freq: Three times a day (TID) | ORAL | 0 refills | Status: DC | PRN

## 2022-08-23 NOTE — Progress Notes (Signed)
Virtual Visit Consent   Becky Mccoy, you are scheduled for a virtual visit with a [facility] provider today. Just as with appointments in the office, your consent must be obtained to participate. Your consent will be active for this visit and any virtual visit you may have with one of our providers in the next 365 days. If you have a MyChart account, a copy of this consent can be sent to you electronically.  As this is a virtual visit, video technology does not allow for your provider to perform a traditional examination. This may limit your provider's ability to fully assess your condition. If your provider identifies any concerns that need to be evaluated in person or the need to arrange testing (such as labs, EKG, etc.), we will make arrangements to do so. Although advances in technology are sophisticated, we cannot ensure that it will always work on either your end or our end. If the connection with a video visit is poor, the visit may have to be switched to a telephone visit. With either a video or telephone visit, we are not always able to ensure that we have a secure connection.  By engaging in this virtual visit, you consent to the provision of healthcare and authorize for your insurance to be billed (if applicable) for the services provided during this visit. Depending on your insurance coverage, you may receive a charge related to this service.  I need to obtain your verbal consent now. Are you willing to proceed with your visit today? Becky Mccoy has provided verbal consent on 08/23/2022 for a virtual visit (video or telephone). Evelina Dun, FNP  Date: 08/23/2022 5:05 PM  Virtual Visit via Video Note   I, Evelina Dun, connected with  Becky Mccoy  ([id number], Sep 08, 1985) on 08/23/22 at  5:00 PM EST by a video-enabled telemedicine application and verified that I am speaking with the correct person using two identifiers.  Location: Patient: Virtual Visit Location  Patient: Home Provider: Virtual Visit Location Provider: Home Office   I discussed the limitations of evaluation and management by telemedicine and the availability of in person appointments. The patient expressed understanding and agreed to proceed.    History of Present Illness: Becky Mccoy is a 37 y.o. who identifies as a female who was assigned female at birth, and is being seen today for cough for 6 days.  HPI: Cough This is a new problem. The current episode started in the past 7 days. The problem has been gradually worsening. The problem occurs every few minutes. The cough is Productive of sputum. Associated symptoms include headaches, myalgias and wheezing. Pertinent negatives include no chills, ear congestion, ear pain, fever, nasal congestion, postnasal drip or shortness of breath. She has tried rest and OTC cough suppressant for the symptoms. The treatment provided mild relief.    Problems:  Patient Active Problem List   Diagnosis Date Noted   Anxiety 07/08/2022   Back pain 05/24/2022   Acid reflux 04/02/2022   Fatty liver 04/02/2022   Eosinophilia 04/02/2022   Headache 02/27/2022   Hypertension, essential 02/27/2022   Colitis 12/12/2021   Depression, major, recurrent ([place]) 04/18/2021   Weight gain 04/18/2021   Breast feeding status of mother 10/27/2020   Vaginal discharge 10/17/2020   Leg skin lesion, right 10/17/2020   Premature rupture of membranes (PROM) affecting fourth pregnancy 08/29/2019   Normal vaginal delivery 08/29/2019   Postpartum care following vaginal delivery 08/29/2019   Encounter for supervision of low-risk  pregnancy, antepartum 01/09/2019   Hypercholesterolemia 06/18/2018   Nipple discharge 12/11/2016   Chest pain 12/11/2016   Health care maintenance 02/25/2015   Thyroid fullness 06/07/2014   OCD (obsessive compulsive disorder) 06/07/2014    Allergies:  Allergies  Allergen Reactions   Sulfa Antibiotics    Medications:  Current  Outpatient Medications:    benzonatate (TESSALON PERLES) 100 MG capsule, Take 1 capsule (100 mg total) by mouth 3 (three) times daily as needed., Disp: 20 capsule, Rfl: 0   predniSONE (DELTASONE) 20 MG tablet, Take 2 tablets (40 mg total) by mouth daily with breakfast for 5 days., Disp: 10 tablet, Rfl: 0   promethazine-dextromethorphan (PROMETHAZINE-DM) 6.25-15 MG/5ML syrup, Take 5 mLs by mouth 3 (three) times daily as needed for cough., Disp: 118 mL, Rfl: 0   amLODipine (NORVASC) 2.5 MG tablet, Take 1 tablet (2.5 mg total) by mouth daily., Disp: 90 tablet, Rfl: 2   FLUoxetine (PROZAC) 20 MG capsule, Take 3 capsules (60 mg total) by mouth daily., Disp: 270 capsule, Rfl: 1   traZODone (DESYREL) 50 MG tablet, Take 25 mg by mouth at bedtime as needed., Disp: , Rfl:    venlafaxine XR (EFFEXOR XR) 75 MG 24 hr capsule, Take 1 capsule (75 mg total) by mouth daily with breakfast., Disp: 30 capsule, Rfl: 1  Observations/Objective: Patient is well-developed, well-nourished in no acute distress.  Resting comfortably  at home.  Head is normocephalic, atraumatic.  No labored breathing.  Speech is clear and coherent with logical content.  Patient is alert and oriented at baseline.  Coarse nonproductive   Assessment and Plan: 1. Acute bronchitis, unspecified organism - benzonatate (TESSALON PERLES) 100 MG capsule; Take 1 capsule (100 mg total) by mouth 3 (three) times daily as needed.  Dispense: 20 capsule; Refill: 0 - promethazine-dextromethorphan (PROMETHAZINE-DM) 6.25-15 MG/5ML syrup; Take 5 mLs by mouth 3 (three) times daily as needed for cough.  Dispense: 118 mL; Refill: 0 - predniSONE (DELTASONE) 20 MG tablet; Take 2 tablets (40 mg total) by mouth daily with breakfast for 5 days.  Dispense: 10 tablet; Refill: 0  - Take meds as prescribed - Use a cool mist humidifier  -Use saline nose sprays frequently -Force fluids -For any cough or congestion  Use plain Mucinex- regular strength or max strength  is fine -For fever or aces or pains- take tylenol or ibuprofen. -Throat lozenges if help -Follow up if symptoms worsen or do not improve   Follow Up Instructions: I discussed the assessment and treatment plan with the patient. The patient was provided an opportunity to ask questions and all were answered. The patient agreed with the plan and demonstrated an understanding of the instructions.  A copy of instructions were sent to the patient via MyChart unless otherwise noted below.     The patient was advised to call back or seek an in-person evaluation if the symptoms worsen or if the condition fails to improve as anticipated.  Time:  I spent 8 minutes with the patient via telehealth technology discussing the above problems/concerns.    Evelina Dun, FNP

## 2022-09-27 ENCOUNTER — Telehealth: Payer: Self-pay

## 2022-09-27 NOTE — Telephone Encounter (Signed)
PT MOVED TO 8AM SLOT

## 2022-09-27 NOTE — Telephone Encounter (Signed)
Pt called and I read the message to her and she is fine with the 8am appointment

## 2022-09-27 NOTE — Telephone Encounter (Signed)
LM FOR PT TO CB TO MOVE APPOINTMENT TO 8AM

## 2022-09-28 ENCOUNTER — Encounter: Payer: Self-pay | Admitting: Internal Medicine

## 2022-09-28 ENCOUNTER — Ambulatory Visit (INDEPENDENT_AMBULATORY_CARE_PROVIDER_SITE_OTHER): Payer: BC Managed Care – PPO | Admitting: Internal Medicine

## 2022-09-28 ENCOUNTER — Ambulatory Visit: Payer: BC Managed Care – PPO | Admitting: Internal Medicine

## 2022-09-28 VITALS — BP 128/80 | HR 96 | Temp 97.9°F | Resp 16 | Ht 66.0 in | Wt 174.0 lb

## 2022-09-28 DIAGNOSIS — F339 Major depressive disorder, recurrent, unspecified: Secondary | ICD-10-CM | POA: Diagnosis not present

## 2022-09-28 DIAGNOSIS — K760 Fatty (change of) liver, not elsewhere classified: Secondary | ICD-10-CM

## 2022-09-28 DIAGNOSIS — E7800 Pure hypercholesterolemia, unspecified: Secondary | ICD-10-CM

## 2022-09-28 DIAGNOSIS — F419 Anxiety disorder, unspecified: Secondary | ICD-10-CM | POA: Diagnosis not present

## 2022-09-28 DIAGNOSIS — F429 Obsessive-compulsive disorder, unspecified: Secondary | ICD-10-CM

## 2022-09-28 DIAGNOSIS — I10 Essential (primary) hypertension: Secondary | ICD-10-CM

## 2022-09-28 MED ORDER — VENLAFAXINE HCL ER 37.5 MG PO CP24: 37.5000 mg | ORAL_CAPSULE | Freq: Every day | ORAL | 1 refills | Status: DC

## 2022-09-28 NOTE — Progress Notes (Signed)
Subjective:    Patient ID: Becky Mccoy, female    DOB: 1986-04-01, 37 y.o.   MRN: [id number]  Patient here for  Chief Complaint  Patient presents with   Medical Management of Chronic Issues    HPI Here as a work in appt to discuss medications.  Has a history of increased stress and anxiety and ocd. Has been seeing Dr Nicolasa Ducking.  Taking trazodone, prozac and effexor. She is doing better.  Has a new job.  Starting soon. Will be working for [employer] 7-7 Saturday and Sunday.  Does feel better.  Does feel the effexor dose is a little too much for her.  Feels like it does give her energy and she feels more like doing - may be affecting sleep.  Taking trazodone.  Would prefer to decrease dose of effexor and see how she feels.  She is seeing a therapist q 2 weeks. Increased acid reflux noted previously.  Pepcid. Overall feels she is doing better.    Past Medical History:  Diagnosis Date   COVID-19    04/2021 11/202   History of chicken pox    Hx: UTI (urinary tract infection)    Obsessive-compulsive and related disorder    OCD (obsessive compulsive disorder)    Past Surgical History:  Procedure Laterality Date   WISDOM TOOTH EXTRACTION     Family History  Problem Relation Age of Onset   Healthy Mother    Healthy Father    Cancer - Other Maternal Grandfather        stomach cancer   Cancer - Other Paternal Grandfather        Bone cancer   Social History   Socioeconomic History   Marital status: Married    Spouse name: Not on file   Number of children: Not on file   Years of education: Not on file   Highest education level: Not on file  Occupational History   Not on file  Tobacco Use   Smoking status: Never   Smokeless tobacco: Never  Vaping Use   Vaping Use: Never used  Substance and Sexual Activity   Alcohol use: No    Alcohol/week: 0.0 standard drinks of alcohol   Drug use: No   Sexual activity: Yes    Birth control/protection: None    Comment: Vasectomy   Other Topics Concern   Not on file  Social History Narrative   Not on file   Social Determinants of Health   Financial Resource Strain: Not on file  Food Insecurity: Not on file  Transportation Needs: Not on file  Physical Activity: Not on file  Stress: Not on file  Social Connections: Not on file     Review of Systems  Constitutional:  Negative for appetite change and unexpected weight change.  HENT:  Negative for congestion and sinus pressure.   Respiratory:  Negative for cough, chest tightness and shortness of breath.   Cardiovascular:  Negative for chest pain and palpitations.  Gastrointestinal:  Negative for abdominal pain, diarrhea, nausea and vomiting.  Genitourinary:  Negative for difficulty urinating and dysuria.  Musculoskeletal:  Negative for joint swelling and myalgias.  Skin:  Negative for color change and rash.  Neurological:  Negative for dizziness and headaches.  Psychiatric/Behavioral:  Negative for agitation and dysphoric mood.        Objective:     BP 128/80   Pulse 96   Temp 97.9 F (36.6 C)   Resp 16   Ht 5'  6" (1.676 m)   Wt 174 lb (78.9 kg)   SpO2 98%   BMI 28.08 kg/m  Wt Readings from Last 3 Encounters:  09/28/22 174 lb (78.9 kg)  07/20/22 170 lb 12.8 oz (77.5 kg)  07/04/22 171 lb 3.2 oz (77.7 kg)    Physical Exam Vitals reviewed.  Constitutional:      General: She is not in acute distress.    Appearance: Normal appearance.  HENT:     Head: Normocephalic and atraumatic.     Right Ear: External ear normal.     Left Ear: External ear normal.  Eyes:     General: No scleral icterus.       Right eye: No discharge.        Left eye: No discharge.     Conjunctiva/sclera: Conjunctivae normal.  Neck:     Thyroid: No thyromegaly.  Cardiovascular:     Rate and Rhythm: Normal rate and regular rhythm.  Pulmonary:     Effort: No respiratory distress.     Breath sounds: Normal breath sounds. No wheezing.  Abdominal:     General: Bowel  sounds are normal.     Palpations: Abdomen is soft.     Tenderness: There is no abdominal tenderness.  Musculoskeletal:        General: No swelling or tenderness.     Cervical back: Neck supple. No tenderness.  Lymphadenopathy:     Cervical: No cervical adenopathy.  Skin:    Findings: No erythema or rash.  Neurological:     Mental Status: She is alert.  Psychiatric:        Mood and Affect: Mood normal.        Behavior: Behavior normal.      Outpatient Encounter Medications as of 09/28/2022  Medication Sig   amLODipine (NORVASC) 2.5 MG tablet Take 1 tablet (2.5 mg total) by mouth daily.   FLUoxetine (PROZAC) 20 MG capsule Take 3 capsules (60 mg total) by mouth daily.   traZODone (DESYREL) 50 MG tablet Take 25 mg by mouth at bedtime as needed.   venlafaxine XR (EFFEXOR XR) 37.5 MG 24 hr capsule Take 1 capsule (37.5 mg total) by mouth daily with breakfast.   [DISCONTINUED] venlafaxine XR (EFFEXOR XR) 75 MG 24 hr capsule Take 1 capsule (75 mg total) by mouth daily with breakfast.   [DISCONTINUED] benzonatate (TESSALON PERLES) 100 MG capsule Take 1 capsule (100 mg total) by mouth 3 (three) times daily as needed.   [DISCONTINUED] promethazine-dextromethorphan (PROMETHAZINE-DM) 6.25-15 MG/5ML syrup Take 5 mLs by mouth 3 (three) times daily as needed for cough.   No facility-administered encounter medications on file as of 09/28/2022.     Lab Results  Component Value Date   WBC 9.1 05/24/2022   HGB 14.2 05/24/2022   HCT 41.8 05/24/2022   PLT 323.0 05/24/2022   GLUCOSE 86 05/24/2022   CHOL 237 (H) 04/20/2022   TRIG (H) 04/20/2022    454.0 Triglyceride is over 400; calculations on Lipids are invalid.   HDL 37.40 (L) 04/20/2022   LDLDIRECT 135.0 04/20/2022   LDLCALC 152 (H) 09/19/2018   ALT 15 04/20/2022   AST 15 04/20/2022   NA 136 05/24/2022   K 4.2 05/24/2022   CL 102 05/24/2022   CREATININE 0.62 05/24/2022   BUN 17 05/24/2022   CO2 24 05/24/2022   TSH 1.28 01/10/2022     CT Renal Stone Study  Result Date: 05/25/2022 CLINICAL DATA:  Right-greater-than-left flank pain x3 weeks. EXAM: CT ABDOMEN  AND PELVIS WITHOUT CONTRAST TECHNIQUE: Multidetector CT imaging of the abdomen and pelvis was performed following the standard protocol without IV contrast. RADIATION DOSE REDUCTION: This exam was performed according to the departmental dose-optimization program which includes automated exposure control, adjustment of the mA and/or kV according to patient size and/or use of iterative reconstruction technique. COMPARISON:  Radiograph Dec 13, 2021 FINDINGS: Lower chest: No acute abnormality. Hepatobiliary: Hepatomegaly with diffuse hepatic steatosis and focal areas of fatty sparing. Gallbladder is decompressed. No biliary ductal dilation. Pancreas: No pancreatic ductal dilation or evidence of acute inflammation. Spleen: No splenomegaly Adrenals/Urinary Tract: Bilateral adrenal glands appear normal. No hydronephrosis. No renal, ureteral or bladder calculi identified. Stomach/Bowel: No radiopaque enteric contrast material was administered. Stomach is unremarkable for degree of distension. No pathologic dilation of small or large bowel. Appendix and terminal ileum appear normal. No evidence of acute bowel inflammation. Vascular/Lymphatic: Normal caliber abdominal aorta. No pathologically enlarged abdominal or pelvic lymph nodes Reproductive: Uterus and bilateral adnexa are unremarkable. Other: No significant abdominopelvic free fluid. Musculoskeletal: No acute osseous abnormality IMPRESSION: 1. No acute abnormality in the abdomen or pelvis. Specifically, no evidence of obstructive uropathy. 2. Hepatomegaly with diffuse hepatic steatosis which in the setting of steatohepatitis can be a cause of abdominal pain. Electronically Signed   By: Dahlia Bailiff M.D.   On: 05/25/2022 17:56       Assessment & Plan:  Anxiety Assessment & Plan: History of increased stress/anxiety.  On prozac.   Effexor was previously added.  Has more energy.  More motivated.  Feels like she may have a little too much energy.  Discussed today.  She request to decrease effexor and see if this helps above feeling and see if helps with sleep.  Currently taking trazodone.  Prefers not to increase the dose of trazodone - makes too sleepy.    Orders: -     Ambulatory referral to Psychiatry  Episode of recurrent major depressive disorder, unspecified depression episode severity ([place]) Assessment & Plan: On prozac and effexor.  Overall doing much better.  Does request to decrease effexor dose as outlined.  Seeing her therapist q 2 weeks.  Due to an insurance coverage issue, request to see a different psychiatrist.    Orders: -     Ambulatory referral to Psychiatry  Fatty liver Assessment & Plan: Has seen GI.  Have discussed diet/exercise/weight loss.  Follow liver function tests.    Hypercholesterolemia Assessment & Plan: Low cholesterol diet and exercise.  Follow lipid panel.   Hypertension, essential Assessment & Plan: Blood pressure doing better on amlodipine.  Decrease effexor to 37.'5mg'$  q day as outlined. Follow pressures.  Follow metabolic panel.    Obsessive-compulsive disorder, unspecified type Assessment & Plan: On prozac and effexor.  Seeing her therapist q 2 weeks.  Overall doing better.  Follow.   Orders: -     Ambulatory referral to Psychiatry  Other orders -     Venlafaxine HCl ER; Take 1 capsule (37.5 mg total) by mouth daily with breakfast.  Dispense: 30 capsule; Refill: 1     Einar Pheasant, MD

## 2022-09-28 NOTE — Patient Instructions (Signed)
Stop effexor 33m   Start effexor 37.544m- one per day.

## 2022-10-08 ENCOUNTER — Encounter: Payer: Self-pay | Admitting: Internal Medicine

## 2022-10-08 NOTE — Assessment & Plan Note (Signed)
Low cholesterol diet and exercise.  Follow lipid panel.   

## 2022-10-08 NOTE — Assessment & Plan Note (Signed)
Has seen GI.  Have discussed diet/exercise/weight loss.  Follow liver function tests.

## 2022-10-08 NOTE — Assessment & Plan Note (Signed)
History of increased stress/anxiety.  On prozac.  Effexor was previously added.  Has more energy.  More motivated.  Feels like she may have a little too much energy.  Discussed today.  She request to decrease effexor and see if this helps above feeling and see if helps with sleep.  Currently taking trazodone.  Prefers not to increase the dose of trazodone - makes too sleepy.

## 2022-10-08 NOTE — Assessment & Plan Note (Addendum)
Blood pressure doing better on amlodipine.  Decrease effexor to 37.'5mg'$  q day as outlined. Follow pressures.  Follow metabolic panel.

## 2022-10-08 NOTE — Assessment & Plan Note (Addendum)
On prozac and effexor.  Overall doing much better.  Does request to decrease effexor dose as outlined.  Seeing her therapist q 2 weeks.  Due to an insurance coverage issue, request to see a different psychiatrist.

## 2022-10-08 NOTE — Assessment & Plan Note (Signed)
On prozac and effexor.  Seeing her therapist q 2 weeks.  Overall doing better.  Follow.

## 2022-10-30 ENCOUNTER — Telehealth: Payer: Self-pay | Admitting: Family Medicine

## 2022-10-30 DIAGNOSIS — J019 Acute sinusitis, unspecified: Secondary | ICD-10-CM

## 2022-10-30 MED ORDER — AMOXICILLIN-POT CLAVULANATE 875-125 MG PO TABS: 1.0000 | ORAL_TABLET | Freq: Two times a day (BID) | ORAL | 0 refills | Status: DC

## 2022-10-30 MED ORDER — FLUTICASONE PROPIONATE 50 MCG/ACT NA SUSP: 2.0000 | Freq: Every day | NASAL | 0 refills | Status: DC

## 2022-10-30 NOTE — Progress Notes (Signed)
Virtual Visit Consent   Becky Mccoy, you are scheduled for a virtual visit with a [facility] provider today. Just as with appointments in the office, your consent must be obtained to participate. Your consent will be active for this visit and any virtual visit you may have with one of our providers in the next 365 days. If you have a MyChart account, a copy of this consent can be sent to you electronically.  As this is a virtual visit, video technology does not allow for your provider to perform a traditional examination. This may limit your provider's ability to fully assess your condition. If your provider identifies any concerns that need to be evaluated in person or the need to arrange testing (such as labs, EKG, etc.), we will make arrangements to do so. Although advances in technology are sophisticated, we cannot ensure that it will always work on either your end or our end. If the connection with a video visit is poor, the visit may have to be switched to a telephone visit. With either a video or telephone visit, we are not always able to ensure that we have a secure connection.  By engaging in this virtual visit, you consent to the provision of healthcare and authorize for your insurance to be billed (if applicable) for the services provided during this visit. Depending on your insurance coverage, you may receive a charge related to this service.  I need to obtain your verbal consent now. Are you willing to proceed with your visit today? DRUANN STOLZMAN has provided verbal consent on 10/30/2022 for a virtual visit (video or telephone). Chanda Busing, [place]  Date: 10/30/2022 7:13 PM  Virtual Visit via Video Note   I, Chanda Busing, connected with  Becky Mccoy  ([id number], 10-24-1985) on 10/30/22 at  6:30 PM EDT by a video-enabled telemedicine application and verified that I am speaking with the correct person using two identifiers.  Location: Patient: Virtual Visit Location  Patient: Home Provider: Virtual Visit Location Provider: Home Office   I discussed the limitations of evaluation and management by telemedicine and the availability of in person appointments. The patient expressed understanding and agreed to proceed.    History of Present Illness: Becky Mccoy is a 37 y.o. who identifies as a female who was assigned female at birth, and is being seen today for complaint of head pressure and congestion.. Pt states she took COVID test 10 days ago and was positive -Pt states she is experiencing headache, nose congestion, and head pressure, jaw is hurting and ears fee stopped up -Pt states has taken Tylenol and Dayquil and it helped a little bit but pain came right back  -Pt states she has neon green thick mucus  -Pt states she is also dizzy  -Pt states no one at home is sick except for her  -Pt states having symptoms for the last 10 days and feels like symptoms are worsening.    HPI: HPI  Problems:  Patient Active Problem List   Diagnosis Date Noted   Anxiety 07/08/2022   Back pain 05/24/2022   Acid reflux 04/02/2022   Fatty liver 04/02/2022   Eosinophilia 04/02/2022   Headache 02/27/2022   Hypertension, essential 02/27/2022   Colitis 12/12/2021   Depression, major, recurrent ([place]) 04/18/2021   Weight gain 04/18/2021   Breast feeding status of mother 10/27/2020   Vaginal discharge 10/17/2020   Leg skin lesion, right 10/17/2020   Premature rupture of membranes (PROM) affecting fourth pregnancy  08/29/2019   Normal vaginal delivery 08/29/2019   Postpartum care following vaginal delivery 08/29/2019   Encounter for supervision of low-risk pregnancy, antepartum 01/09/2019   Hypercholesterolemia 06/18/2018   Nipple discharge 12/11/2016   Chest pain 12/11/2016   Health care maintenance 02/25/2015   Thyroid fullness 06/07/2014   OCD (obsessive compulsive disorder) 06/07/2014    Allergies:  Allergies  Allergen Reactions   Sulfa Antibiotics     Medications:  Current Outpatient Medications:    amLODipine (NORVASC) 2.5 MG tablet, Take 1 tablet (2.5 mg total) by mouth daily., Disp: 90 tablet, Rfl: 2   FLUoxetine (PROZAC) 20 MG capsule, Take 3 capsules (60 mg total) by mouth daily., Disp: 270 capsule, Rfl: 1   traZODone (DESYREL) 50 MG tablet, Take 25 mg by mouth at bedtime as needed., Disp: , Rfl:    venlafaxine XR (EFFEXOR XR) 37.5 MG 24 hr capsule, Take 1 capsule (37.5 mg total) by mouth daily with breakfast., Disp: 30 capsule, Rfl: 1  Observations/Objective: Patient is well-developed, well-nourished in no acute distress.  Resting comfortably at home.  Head is normocephalic, atraumatic.  No labored breathing.  Speech is clear and coherent with logical content.  Patient is alert and oriented at baseline.   Assessment and Plan: 1. Acute sinusitis, recurrence not specified, unspecified location -Discussed some over the counter nasal rinses to try and to continue OTC Tylenol  -Start Augmentin and Flonase  -Patient to make in person visit if symptoms persist or worsen    Follow Up Instructions: I discussed the assessment and treatment plan with the patient. The patient was provided an opportunity to ask questions and all were answered. The patient agreed with the plan and demonstrated an understanding of the instructions.  A copy of instructions were sent to the patient via MyChart unless otherwise noted below.     The patient was advised to call back or seek an in-person evaluation if the symptoms worsen or if the condition fails to improve as anticipated.  Time:  I spent 15 minutes with the patient via telehealth technology discussing the above problems/concerns.    Chanda Busing, PA-C

## 2022-10-30 NOTE — Patient Instructions (Signed)
  Satira Anis, thank you for joining Chanda Busing, PA-C for today's virtual visit.  While this provider is not your primary care provider (PCP), if your PCP is located in our provider database this encounter information will be shared with them immediately following your visit.   [facility] account gives you access to today's visit and all your visits, tests, and labs performed at [facility] " click here if you don't have a [facility] account or go to [email or web address]  Consent: (Patient) Satira Anis provided verbal consent for this virtual visit at the beginning of the encounter.  Current Medications:  Current Outpatient Medications:    amLODipine (NORVASC) 2.5 MG tablet, Take 1 tablet (2.5 mg total) by mouth daily., Disp: 90 tablet, Rfl: 2   FLUoxetine (PROZAC) 20 MG capsule, Take 3 capsules (60 mg total) by mouth daily., Disp: 270 capsule, Rfl: 1   traZODone (DESYREL) 50 MG tablet, Take 25 mg by mouth at bedtime as needed., Disp: , Rfl:    venlafaxine XR (EFFEXOR XR) 37.5 MG 24 hr capsule, Take 1 capsule (37.5 mg total) by mouth daily with breakfast., Disp: 30 capsule, Rfl: 1   Medications ordered in this encounter:  No orders of the defined types were placed in this encounter.    *If you need refills on other medications prior to your next appointment, please contact your pharmacy*  Follow-Up: Call back or seek an in-person evaluation if the symptoms worsen or if the condition fails to improve as anticipated.  [facility] [phone]  Other Instructions    If you have been instructed to have an in-person evaluation today at a local Urgent Care facility, please use the link below. It will take you to a list of all of our available [facility], including address, phone number and hours of operation. Please do not delay care.  [facility]  If you or a family member do not have a primary  care provider, use the link below to schedule a visit and establish care. When you choose a [facility] primary care physician or advanced practice provider, you gain a long-term partner in health. Find a Primary Care Provider  Learn more about [facility]'s in-office and virtual care options: [facility]

## 2022-11-24 ENCOUNTER — Other Ambulatory Visit: Payer: Self-pay | Admitting: Internal Medicine

## 2022-11-25 ENCOUNTER — Other Ambulatory Visit: Payer: Self-pay | Admitting: Internal Medicine

## 2022-11-29 ENCOUNTER — Ambulatory Visit (INDEPENDENT_AMBULATORY_CARE_PROVIDER_SITE_OTHER): Payer: PRIVATE HEALTH INSURANCE | Admitting: Internal Medicine

## 2022-11-29 ENCOUNTER — Encounter: Payer: Self-pay | Admitting: Internal Medicine

## 2022-11-29 VITALS — BP 122/78 | HR 88 | Temp 97.9°F | Resp 16 | Ht 66.0 in | Wt 169.0 lb

## 2022-11-29 DIAGNOSIS — D721 Eosinophilia, unspecified: Secondary | ICD-10-CM

## 2022-11-29 DIAGNOSIS — F339 Major depressive disorder, recurrent, unspecified: Secondary | ICD-10-CM | POA: Diagnosis not present

## 2022-11-29 DIAGNOSIS — E7800 Pure hypercholesterolemia, unspecified: Secondary | ICD-10-CM

## 2022-11-29 DIAGNOSIS — F419 Anxiety disorder, unspecified: Secondary | ICD-10-CM | POA: Diagnosis not present

## 2022-11-29 DIAGNOSIS — I10 Essential (primary) hypertension: Secondary | ICD-10-CM

## 2022-11-29 DIAGNOSIS — F429 Obsessive-compulsive disorder, unspecified: Secondary | ICD-10-CM

## 2022-11-29 DIAGNOSIS — K760 Fatty (change of) liver, not elsewhere classified: Secondary | ICD-10-CM

## 2022-11-29 MED ORDER — AMLODIPINE BESYLATE 2.5 MG PO TABS: 2.5000 mg | ORAL_TABLET | Freq: Every day | ORAL | 2 refills | Status: DC

## 2022-11-29 NOTE — Progress Notes (Signed)
Subjective:    Patient ID: Becky Mccoy, female    DOB: [birth year], 37 y.o.   MRN: [id number]  Patient here for  Chief Complaint  Patient presents with   Medical Management of Chronic Issues    HPI Here to follow up regarding increased stress, anxiety and OCD.  Was seeing Dr Maryruth Bun.  On trazodone, prozac and was on effexor. Off effexor now.  Has been off for four days.  New job - [employer].  Was working third shift.  Changing next week to first shift. Feeling better.  No chest pain or sob reported.  No cough or congestion reported.  Taking trazodone prn.    Past Medical History:  Diagnosis Date   COVID-19    04/2021 11/202   History of chicken pox    Hx: UTI (urinary tract infection)    Obsessive-compulsive and related disorder    OCD (obsessive compulsive disorder)    Past Surgical History:  Procedure Laterality Date   WISDOM TOOTH EXTRACTION     Family History  Problem Relation Age of Onset   Healthy Mother    Healthy Father    Cancer - Other Maternal Grandfather        stomach cancer   Cancer - Other Paternal Grandfather        Bone cancer   Social History   Socioeconomic History   Marital status: Married    Spouse name: Not on file   Number of children: Not on file   Years of education: Not on file   Highest education level: Not on file  Occupational History   Not on file  Tobacco Use   Smoking status: Never   Smokeless tobacco: Never  Vaping Use   Vaping Use: Never used  Substance and Sexual Activity   Alcohol use: No    Alcohol/week: 0.0 standard drinks of alcohol   Drug use: No   Sexual activity: Yes    Birth control/protection: None    Comment: Vasectomy  Other Topics Concern   Not on file  Social History Narrative   Not on file   Social Determinants of Health   Financial Resource Strain: Not on file  Food Insecurity: Not on file  Transportation Needs: Not on file  Physical Activity: Not on file  Stress: Not on file  Social  Connections: Not on file     Review of Systems  Constitutional:  Negative for appetite change and unexpected weight change.  HENT:  Negative for congestion and sinus pressure.   Respiratory:  Negative for cough, chest tightness and shortness of breath.   Cardiovascular:  Negative for chest pain, palpitations and leg swelling.  Gastrointestinal:  Negative for abdominal pain, diarrhea, nausea and vomiting.  Genitourinary:  Negative for difficulty urinating and dysuria.  Musculoskeletal:  Negative for joint swelling and myalgias.  Skin:  Negative for color change and rash.  Neurological:  Negative for dizziness and headaches.  Psychiatric/Behavioral:  Negative for agitation and dysphoric mood.        Objective:     BP 122/78   Pulse 88   Temp 97.9 F (36.6 C)   Resp 16   Ht 5\' 6"  (1.676 m)   Wt 169 lb (76.7 kg)   SpO2 98%   BMI 27.28 kg/m  Wt Readings from Last 3 Encounters:  11/29/22 169 lb (76.7 kg)  09/28/22 174 lb (78.9 kg)  07/20/22 170 lb 12.8 oz (77.5 kg)    Physical Exam Vitals reviewed.  Constitutional:  General: She is not in acute distress.    Appearance: Normal appearance.  HENT:     Head: Normocephalic and atraumatic.     Right Ear: External ear normal.     Left Ear: External ear normal.  Eyes:     General: No scleral icterus.       Right eye: No discharge.        Left eye: No discharge.     Conjunctiva/sclera: Conjunctivae normal.  Neck:     Thyroid: No thyromegaly.  Cardiovascular:     Rate and Rhythm: Normal rate and regular rhythm.  Pulmonary:     Effort: No respiratory distress.     Breath sounds: Normal breath sounds. No wheezing.  Abdominal:     General: Bowel sounds are normal.     Palpations: Abdomen is soft.     Tenderness: There is no abdominal tenderness.  Musculoskeletal:        General: No swelling or tenderness.     Cervical back: Neck supple. No tenderness.  Lymphadenopathy:     Cervical: No cervical adenopathy.  Skin:     Findings: No erythema or rash.  Neurological:     Mental Status: She is alert.  Psychiatric:        Mood and Affect: Mood normal.        Behavior: Behavior normal.      Outpatient Encounter Medications as of 11/29/2022  Medication Sig   amLODipine (NORVASC) 2.5 MG tablet Take 1 tablet (2.5 mg total) by mouth daily.   FLUoxetine (PROZAC) 40 MG capsule TAKE 1 CAPSULE BY MOUTH EVERY DAY   fluticasone (FLONASE) 50 MCG/ACT nasal spray Place 2 sprays into both nostrils daily.   traZODone (DESYREL) 50 MG tablet Take 25 mg by mouth at bedtime as needed.   venlafaxine XR (EFFEXOR-XR) 37.5 MG 24 hr capsule TAKE 1 CAPSULE BY MOUTH DAILY WITH BREAKFAST.   [DISCONTINUED] amLODipine (NORVASC) 2.5 MG tablet Take 1 tablet (2.5 mg total) by mouth daily.   [DISCONTINUED] amoxicillin-clavulanate (AUGMENTIN) 875-125 MG tablet Take 1 tablet by mouth 2 (two) times daily.   [DISCONTINUED] FLUoxetine (PROZAC) 20 MG capsule Take 3 capsules (60 mg total) by mouth daily.   No facility-administered encounter medications on file as of 11/29/2022.     Lab Results  Component Value Date   WBC 9.1 05/24/2022   HGB 14.2 05/24/2022   HCT 41.8 05/24/2022   PLT 323.0 05/24/2022   GLUCOSE 86 05/24/2022   CHOL 237 (H) 04/20/2022   TRIG (H) 04/20/2022    454.0 Triglyceride is over 400; calculations on Lipids are invalid.   HDL 37.40 (L) 04/20/2022   LDLDIRECT 135.0 04/20/2022   LDLCALC 152 (H) 09/19/2018   ALT 15 04/20/2022   AST 15 04/20/2022   NA 136 05/24/2022   K 4.2 05/24/2022   CL 102 05/24/2022   CREATININE 0.62 05/24/2022   BUN 17 05/24/2022   CO2 24 05/24/2022   TSH 1.28 01/10/2022    CT Renal Stone Study  Result Date: 05/25/2022 CLINICAL DATA:  Right-greater-than-left flank pain x3 weeks. EXAM: CT ABDOMEN AND PELVIS WITHOUT CONTRAST TECHNIQUE: Multidetector CT imaging of the abdomen and pelvis was performed following the standard protocol without IV contrast. RADIATION DOSE REDUCTION: This exam  was performed according to the departmental dose-optimization program which includes automated exposure control, adjustment of the mA and/or kV according to patient size and/or use of iterative reconstruction technique. COMPARISON:  Radiograph Dec 13, 2021 FINDINGS: Lower chest: No acute abnormality. Hepatobiliary:  Hepatomegaly with diffuse hepatic steatosis and focal areas of fatty sparing. Gallbladder is decompressed. No biliary ductal dilation. Pancreas: No pancreatic ductal dilation or evidence of acute inflammation. Spleen: No splenomegaly Adrenals/Urinary Tract: Bilateral adrenal glands appear normal. No hydronephrosis. No renal, ureteral or bladder calculi identified. Stomach/Bowel: No radiopaque enteric contrast material was administered. Stomach is unremarkable for degree of distension. No pathologic dilation of small or large bowel. Appendix and terminal ileum appear normal. No evidence of acute bowel inflammation. Vascular/Lymphatic: Normal caliber abdominal aorta. No pathologically enlarged abdominal or pelvic lymph nodes Reproductive: Uterus and bilateral adnexa are unremarkable. Other: No significant abdominopelvic free fluid. Musculoskeletal: No acute osseous abnormality IMPRESSION: 1. No acute abnormality in the abdomen or pelvis. Specifically, no evidence of obstructive uropathy. 2. Hepatomegaly with diffuse hepatic steatosis which in the setting of steatohepatitis can be a cause of abdominal pain. Electronically Signed   By: Maudry Mayhew M.D.   On: 05/25/2022 17:56       Assessment & Plan:  Anxiety Assessment & Plan: History of increased stress/anxiety.  On prozac.  Off effexor.  Discussed today.  Has been working third shift.  Changing next week to first shift.  Feeling better overall.  Follow.  Call with update.    Episode of recurrent major depressive disorder, unspecified depression episode severity (HCC) Assessment & Plan: On prozac.  Overall doing much better. Seeing her  therapist.  Follow.     Fatty liver Assessment & Plan: Has seen GI.  Have discussed diet/exercise/weight loss.  Follow liver function tests.   Orders: -     Hepatic function panel; Future  Eosinophilia, unspecified type Assessment & Plan: Follow cbc.    Hypercholesterolemia Assessment & Plan: Low cholesterol diet and exercise.  Follow lipid panel.  Orders: -     Lipid panel; Future -     TSH; Future -     Basic metabolic panel; Future  Hypertension, essential Assessment & Plan: Blood pressure doing better on amlodipine.  Follow pressures.  Follow metabolic panel.    Obsessive-compulsive disorder, unspecified type Assessment & Plan: On prozac. Seeing her therapist.  Overall doing better.  Follow.    Other orders -     amLODIPine Besylate; Take 1 tablet (2.5 mg total) by mouth daily.  Dispense: 90 tablet; Refill: 2     Dale [place], MD

## 2022-12-10 ENCOUNTER — Encounter: Payer: Self-pay | Admitting: Internal Medicine

## 2022-12-10 NOTE — Assessment & Plan Note (Signed)
On prozac. Seeing her therapist.  Overall doing better.  Follow.

## 2022-12-10 NOTE — Assessment & Plan Note (Signed)
Follow cbc.  

## 2022-12-10 NOTE — Assessment & Plan Note (Signed)
On prozac.  Overall doing much better. Seeing her therapist.  Follow.

## 2022-12-10 NOTE — Assessment & Plan Note (Signed)
Low cholesterol diet and exercise.  Follow lipid panel.   

## 2022-12-10 NOTE — Assessment & Plan Note (Signed)
Blood pressure doing better on amlodipine.  Follow pressures.  Follow metabolic panel.  

## 2022-12-10 NOTE — Assessment & Plan Note (Signed)
History of increased stress/anxiety.  On prozac.  Off effexor.  Discussed today.  Has been working third shift.  Changing next week to first shift.  Feeling better overall.  Follow.  Call with update.

## 2022-12-10 NOTE — Assessment & Plan Note (Signed)
Has seen GI.  Have discussed diet/exercise/weight loss.  Follow liver function tests.  

## 2023-01-15 ENCOUNTER — Other Ambulatory Visit: Payer: Self-pay | Admitting: Internal Medicine

## 2023-03-07 ENCOUNTER — Other Ambulatory Visit: Payer: 59

## 2023-03-07 DIAGNOSIS — K760 Fatty (change of) liver, not elsewhere classified: Secondary | ICD-10-CM

## 2023-03-07 DIAGNOSIS — E7800 Pure hypercholesterolemia, unspecified: Secondary | ICD-10-CM | POA: Diagnosis not present

## 2023-03-07 LAB — BASIC METABOLIC PANEL
BUN: 15 mg/dL (ref 6–23)
CO2: 25 mEq/L (ref 19–32)
Calcium: 9.4 mg/dL (ref 8.4–10.5)
Chloride: 104 mEq/L (ref 96–112)
Creatinine, Ser: 0.65 mg/dL (ref 0.40–1.20)
GFR: 113.41 mL/min (ref >60.00)
Glucose, Bld: 90 mg/dL (ref 70–99)
Potassium: 4.4 mEq/L (ref 3.5–5.1)
Sodium: 137 mEq/L (ref 135–145)

## 2023-03-07 LAB — HEPATIC FUNCTION PANEL
ALT: 17 U/L (ref 0–35)
AST: 18 U/L (ref 0–37)
Albumin: 4.4 g/dL (ref 3.5–5.2)
Alkaline Phosphatase: 63 U/L (ref 39–117)
Bilirubin, Direct: 0.1 mg/dL (ref 0.0–0.3)
Total Bilirubin: 0.4 mg/dL (ref 0.2–1.2)
Total Protein: 7.4 g/dL (ref 6.0–8.3)

## 2023-03-07 LAB — LIPID PANEL
Cholesterol: 230 mg/dL — ABNORMAL HIGH (ref 0–200)
HDL: 35.2 mg/dL — ABNORMAL LOW (ref >39.00)
Total CHOL/HDL Ratio: 7
Triglycerides: 564 mg/dL — ABNORMAL HIGH (ref 0.0–149.0)

## 2023-03-07 LAB — TSH: TSH: 1.96 u[IU]/mL (ref 0.35–5.50)

## 2023-03-07 LAB — LDL CHOLESTEROL, DIRECT: Direct LDL: 110 mg/dL

## 2023-03-13 ENCOUNTER — Encounter: Payer: Self-pay | Admitting: Internal Medicine

## 2023-03-13 ENCOUNTER — Ambulatory Visit: Payer: 59 | Admitting: Internal Medicine

## 2023-03-13 VITALS — BP 108/72 | HR 89 | Temp 98.0°F | Resp 16 | Ht 66.0 in | Wt 173.0 lb

## 2023-03-13 DIAGNOSIS — Z1231 Encounter for screening mammogram for malignant neoplasm of breast: Secondary | ICD-10-CM

## 2023-03-13 DIAGNOSIS — F419 Anxiety disorder, unspecified: Secondary | ICD-10-CM | POA: Diagnosis not present

## 2023-03-13 DIAGNOSIS — Z0000 Encounter for general adult medical examination without abnormal findings: Secondary | ICD-10-CM | POA: Diagnosis not present

## 2023-03-13 DIAGNOSIS — K760 Fatty (change of) liver, not elsewhere classified: Secondary | ICD-10-CM | POA: Diagnosis not present

## 2023-03-13 DIAGNOSIS — F429 Obsessive-compulsive disorder, unspecified: Secondary | ICD-10-CM

## 2023-03-13 DIAGNOSIS — F339 Major depressive disorder, recurrent, unspecified: Secondary | ICD-10-CM

## 2023-03-13 DIAGNOSIS — I10 Essential (primary) hypertension: Secondary | ICD-10-CM

## 2023-03-13 DIAGNOSIS — E7800 Pure hypercholesterolemia, unspecified: Secondary | ICD-10-CM

## 2023-03-13 MED ORDER — AMLODIPINE BESYLATE 2.5 MG PO TABS: 2.5000 mg | ORAL_TABLET | Freq: Every day | ORAL | 2 refills | Status: DC

## 2023-03-13 MED ORDER — FLUOXETINE HCL 40 MG PO CAPS: 40.0000 mg | ORAL_CAPSULE | Freq: Every day | ORAL | 1 refills | Status: DC

## 2023-03-13 NOTE — Assessment & Plan Note (Signed)
Physical today 03/12/23.  PAP 10/12/20 - negative with negative HPV. Discussed baseline mammogram.

## 2023-03-13 NOTE — Progress Notes (Signed)
Subjective:    Patient ID: Becky Mccoy, female    DOB: 1986-03-06, 37 y.o.   MRN: [id number]  Patient here for  Chief Complaint  Patient presents with   Annual Exam    HPI Here for a physical exam.  Had been seeing Dr Maryruth Bun.  On trazodone and prozac.  Off effexor.  Off trazodone.  Still has some nights where she has trouble sleeping. Not every night. Working first shift now and on weekends.  This schedule is working out for her.  Handling stress. On amlodipine for her blood pressure. No chest pain or sob reported.  No increased cough or congestion.  No abdominal pain or bowel change.    Past Medical History:  Diagnosis Date   COVID-19    04/2021 11/202   History of chicken pox    Hx: UTI (urinary tract infection)    Obsessive-compulsive and related disorder    OCD (obsessive compulsive disorder)    Past Surgical History:  Procedure Laterality Date   WISDOM TOOTH EXTRACTION     Family History  Problem Relation Age of Onset   Healthy Mother    Healthy Father    Cancer - Other Maternal Grandfather        stomach cancer   Cancer - Other Paternal Grandfather        Bone cancer   Social History   Socioeconomic History   Marital status: Married    Spouse name: Not on file   Number of children: Not on file   Years of education: Not on file   Highest education level: Not on file  Occupational History   Not on file  Tobacco Use   Smoking status: Never   Smokeless tobacco: Never  Vaping Use   Vaping status: Never Used  Substance and Sexual Activity   Alcohol use: No    Alcohol/week: 0.0 standard drinks of alcohol   Drug use: No   Sexual activity: Yes    Birth control/protection: None    Comment: Vasectomy  Other Topics Concern   Not on file  Social History Narrative   Not on file   Social Determinants of Health   Financial Resource Strain: Not on file  Food Insecurity: Not on file  Transportation Needs: Not on file  Physical Activity: Not on file   Stress: Not on file  Social Connections: Not on file     Review of Systems  Constitutional:  Negative for appetite change and unexpected weight change.  HENT:  Negative for congestion, sinus pressure and sore throat.   Eyes:  Negative for pain and visual disturbance.  Respiratory:  Negative for cough, chest tightness and shortness of breath.   Cardiovascular:  Negative for chest pain, palpitations and leg swelling.  Gastrointestinal:  Negative for abdominal pain, diarrhea, nausea and vomiting.  Genitourinary:  Negative for difficulty urinating and dysuria.  Musculoskeletal:  Negative for joint swelling and myalgias.  Skin:  Negative for color change and rash.  Neurological:  Negative for dizziness and headaches.  Hematological:  Negative for adenopathy. Does not bruise/bleed easily.  Psychiatric/Behavioral:  Positive for sleep disturbance. Negative for agitation and dysphoric mood.        Objective:     BP 108/72   Pulse 89   Temp 98 F (36.7 C)   Resp 16   Ht 5\' 6"  (1.676 m)   Wt 173 lb (78.5 kg)   SpO2 98%   BMI 27.92 kg/m  Wt Readings from Last 3  Encounters:  03/13/23 173 lb (78.5 kg)  11/29/22 169 lb (76.7 kg)  09/28/22 174 lb (78.9 kg)    Physical Exam Vitals reviewed.  Constitutional:      General: She is not in acute distress.    Appearance: Normal appearance. She is well-developed.  HENT:     Head: Normocephalic and atraumatic.     Right Ear: External ear normal.     Left Ear: External ear normal.  Eyes:     General: No scleral icterus.       Right eye: No discharge.        Left eye: No discharge.     Conjunctiva/sclera: Conjunctivae normal.  Neck:     Thyroid: No thyromegaly.  Cardiovascular:     Rate and Rhythm: Normal rate and regular rhythm.  Pulmonary:     Effort: No tachypnea, accessory muscle usage or respiratory distress.     Breath sounds: Normal breath sounds. No decreased breath sounds or wheezing.  Chest:  Breasts:    Right: No  inverted nipple, mass, nipple discharge or tenderness (no axillary adenopathy).     Left: No inverted nipple, mass, nipple discharge or tenderness (no axilarry adenopathy).  Abdominal:     General: Bowel sounds are normal.     Palpations: Abdomen is soft.     Tenderness: There is no abdominal tenderness.  Musculoskeletal:        General: No swelling or tenderness.     Cervical back: Neck supple.  Lymphadenopathy:     Cervical: No cervical adenopathy.  Skin:    Findings: No erythema or rash.  Neurological:     Mental Status: She is alert and oriented to person, place, and time.  Psychiatric:        Mood and Affect: Mood normal.        Behavior: Behavior normal.      Outpatient Encounter Medications as of 03/13/2023  Medication Sig   amLODipine (NORVASC) 2.5 MG tablet Take 1 tablet (2.5 mg total) by mouth daily.   FLUoxetine (PROZAC) 40 MG capsule Take 1 capsule (40 mg total) by mouth daily.   [DISCONTINUED] amLODipine (NORVASC) 2.5 MG tablet Take 1 tablet (2.5 mg total) by mouth daily.   [DISCONTINUED] FLUoxetine (PROZAC) 40 MG capsule TAKE 1 CAPSULE BY MOUTH EVERY DAY   [DISCONTINUED] fluticasone (FLONASE) 50 MCG/ACT nasal spray Place 2 sprays into both nostrils daily.   [DISCONTINUED] traZODone (DESYREL) 50 MG tablet Take 25 mg by mouth at bedtime as needed.   [DISCONTINUED] venlafaxine XR (EFFEXOR-XR) 37.5 MG 24 hr capsule TAKE 1 CAPSULE BY MOUTH DAILY WITH BREAKFAST.   No facility-administered encounter medications on file as of 03/13/2023.     Lab Results  Component Value Date   WBC 9.1 05/24/2022   HGB 14.2 05/24/2022   HCT 41.8 05/24/2022   PLT 323.0 05/24/2022   GLUCOSE 90 03/07/2023   CHOL 230 (H) 03/07/2023   TRIG (H) 03/07/2023    564.0 Triglyceride is over 400; calculations on Lipids are invalid.   HDL 35.20 (L) 03/07/2023   LDLDIRECT 110.0 03/07/2023   LDLCALC 152 (H) 09/19/2018   ALT 17 03/07/2023   AST 18 03/07/2023   NA 137 03/07/2023   K 4.4 03/07/2023    CL 104 03/07/2023   CREATININE 0.65 03/07/2023   BUN 15 03/07/2023   CO2 25 03/07/2023   TSH 1.96 03/07/2023    CT Renal Stone Study  Result Date: 05/25/2022 CLINICAL DATA:  Right-greater-than-left flank pain x3 weeks. EXAM:  CT ABDOMEN AND PELVIS WITHOUT CONTRAST TECHNIQUE: Multidetector CT imaging of the abdomen and pelvis was performed following the standard protocol without IV contrast. RADIATION DOSE REDUCTION: This exam was performed according to the departmental dose-optimization program which includes automated exposure control, adjustment of the mA and/or kV according to patient size and/or use of iterative reconstruction technique. COMPARISON:  Radiograph Dec 13, 2021 FINDINGS: Lower chest: No acute abnormality. Hepatobiliary: Hepatomegaly with diffuse hepatic steatosis and focal areas of fatty sparing. Gallbladder is decompressed. No biliary ductal dilation. Pancreas: No pancreatic ductal dilation or evidence of acute inflammation. Spleen: No splenomegaly Adrenals/Urinary Tract: Bilateral adrenal glands appear normal. No hydronephrosis. No renal, ureteral or bladder calculi identified. Stomach/Bowel: No radiopaque enteric contrast material was administered. Stomach is unremarkable for degree of distension. No pathologic dilation of small or large bowel. Appendix and terminal ileum appear normal. No evidence of acute bowel inflammation. Vascular/Lymphatic: Normal caliber abdominal aorta. No pathologically enlarged abdominal or pelvic lymph nodes Reproductive: Uterus and bilateral adnexa are unremarkable. Other: No significant abdominopelvic free fluid. Musculoskeletal: No acute osseous abnormality IMPRESSION: 1. No acute abnormality in the abdomen or pelvis. Specifically, no evidence of obstructive uropathy. 2. Hepatomegaly with diffuse hepatic steatosis which in the setting of steatohepatitis can be a cause of abdominal pain. Electronically Signed   By: Maudry Mayhew M.D.   On: 05/25/2022  17:56       Assessment & Plan:  Routine general medical examination at a health care facility  Health care maintenance Assessment & Plan: Physical today 03/12/23.  PAP 10/12/20 - negative with negative HPV. Discussed baseline mammogram.    Visit for screening mammogram -     3D Screening Mammogram, Left and Right; Future  Anxiety Assessment & Plan: History of increased stress/anxiety.  On prozac.  Off effexor.  Off trazodone. Working first shift on weekends.  Doing better.  Sleep is an issue.  Discussed.  Hold on further medication.  Behavior modification.  Follow. Feeling better overall.    Episode of recurrent major depressive disorder, unspecified depression episode severity (HCC) Assessment & Plan: On prozac.  Overall doing much better. Seeing her therapist.  Follow.     Fatty liver Assessment & Plan: Has seen GI.  Have discussed diet/exercise/weight loss.  Follow liver function tests.    Hypercholesterolemia Assessment & Plan: Low cholesterol diet and exercise.  Follow lipid panel.   Hypertension, essential Assessment & Plan: Blood pressure doing better on amlodipine.  Follow pressures.  Follow metabolic panel.    Obsessive-compulsive disorder, unspecified type Assessment & Plan: On prozac. Seeing her therapist.  Overall doing better.  Follow.    Other orders -     amLODIPine Besylate; Take 1 tablet (2.5 mg total) by mouth daily.  Dispense: 90 tablet; Refill: 2 -     FLUoxetine HCl; Take 1 capsule (40 mg total) by mouth daily.  Dispense: 90 capsule; Refill: 1     Dale Andrew, MD

## 2023-03-18 ENCOUNTER — Encounter: Payer: Self-pay | Admitting: Internal Medicine

## 2023-03-18 NOTE — Assessment & Plan Note (Signed)
Blood pressure doing better on amlodipine.  Follow pressures.  Follow metabolic panel.  

## 2023-03-18 NOTE — Assessment & Plan Note (Signed)
On prozac.  Overall doing much better. Seeing her therapist.  Follow.

## 2023-03-18 NOTE — Assessment & Plan Note (Signed)
Low cholesterol diet and exercise.  Follow lipid panel.   

## 2023-03-18 NOTE — Assessment & Plan Note (Signed)
Has seen GI.  Have discussed diet/exercise/weight loss.  Follow liver function tests.

## 2023-03-18 NOTE — Assessment & Plan Note (Signed)
History of increased stress/anxiety.  On prozac.  Off effexor.  Off trazodone. Working first shift on weekends.  Doing better.  Sleep is an issue.  Discussed.  Hold on further medication.  Behavior modification.  Follow. Feeling better overall.

## 2023-03-18 NOTE — Assessment & Plan Note (Signed)
On prozac. Seeing her therapist.  Overall doing better.  Follow.

## 2023-05-15 ENCOUNTER — Ambulatory Visit
Admission: RE | Admit: 2023-05-15 | Discharge: 2023-05-15 | Disposition: A | Discharge status: Home / Self Care | Payer: 59 | Source: Ambulatory Visit | Attending: Internal Medicine | Admitting: Internal Medicine

## 2023-05-15 DIAGNOSIS — Z1231 Encounter for screening mammogram for malignant neoplasm of breast: Secondary | ICD-10-CM | POA: Diagnosis present

## 2023-05-16 ENCOUNTER — Other Ambulatory Visit: Payer: Self-pay | Admitting: Internal Medicine

## 2023-05-16 DIAGNOSIS — R928 Other abnormal and inconclusive findings on diagnostic imaging of breast: Secondary | ICD-10-CM

## 2023-05-25 ENCOUNTER — Ambulatory Visit
Admission: RE | Admit: 2023-05-25 | Discharge: 2023-05-25 | Disposition: A | Discharge status: Home / Self Care | Payer: 59 | Source: Ambulatory Visit | Attending: Internal Medicine | Admitting: Internal Medicine

## 2023-05-25 DIAGNOSIS — R928 Other abnormal and inconclusive findings on diagnostic imaging of breast: Secondary | ICD-10-CM | POA: Insufficient documentation

## 2023-06-07 ENCOUNTER — Telehealth: Payer: Self-pay | Admitting: Internal Medicine

## 2023-06-07 NOTE — Telephone Encounter (Signed)
Patient need lab orders.

## 2023-06-08 ENCOUNTER — Other Ambulatory Visit: Payer: Self-pay

## 2023-06-08 DIAGNOSIS — I10 Essential (primary) hypertension: Secondary | ICD-10-CM

## 2023-06-08 DIAGNOSIS — E7800 Pure hypercholesterolemia, unspecified: Secondary | ICD-10-CM

## 2023-06-08 DIAGNOSIS — K760 Fatty (change of) liver, not elsewhere classified: Secondary | ICD-10-CM

## 2023-06-08 NOTE — Telephone Encounter (Signed)
Orders placed.

## 2023-06-12 ENCOUNTER — Other Ambulatory Visit (INDEPENDENT_AMBULATORY_CARE_PROVIDER_SITE_OTHER): Payer: 59

## 2023-06-12 DIAGNOSIS — K760 Fatty (change of) liver, not elsewhere classified: Secondary | ICD-10-CM

## 2023-06-12 DIAGNOSIS — E7800 Pure hypercholesterolemia, unspecified: Secondary | ICD-10-CM

## 2023-06-12 DIAGNOSIS — I10 Essential (primary) hypertension: Secondary | ICD-10-CM

## 2023-06-12 LAB — CBC WITH DIFFERENTIAL/PLATELET
Basophils Absolute: 0 10*3/uL (ref 0.0–0.1)
Basophils Relative: 0.5 % (ref 0.0–3.0)
Eosinophils Absolute: 0.2 10*3/uL (ref 0.0–0.7)
Eosinophils Relative: 4.2 % (ref 0.0–5.0)
HCT: 42.3 % (ref 36.0–46.0)
Hemoglobin: 13.8 g/dL (ref 12.0–15.0)
Lymphocytes Relative: 38.7 % (ref 12.0–46.0)
Lymphs Abs: 2.2 10*3/uL (ref 0.7–4.0)
MCHC: 32.7 g/dL (ref 30.0–36.0)
MCV: 89.1 fL (ref 78.0–100.0)
Monocytes Absolute: 0.4 10*3/uL (ref 0.1–1.0)
Monocytes Relative: 6.5 % (ref 3.0–12.0)
Neutro Abs: 2.9 10*3/uL (ref 1.4–7.7)
Neutrophils Relative %: 50.1 % (ref 43.0–77.0)
Platelets: 320 10*3/uL (ref 150.0–400.0)
RBC: 4.75 Mil/uL (ref 3.87–5.11)
RDW: 13.2 % (ref 11.5–15.5)
WBC: 5.8 10*3/uL (ref 4.0–10.5)

## 2023-06-12 LAB — LIPID PANEL
Cholesterol: 215 mg/dL — ABNORMAL HIGH (ref 0–200)
HDL: 36.7 mg/dL — ABNORMAL LOW (ref >39.00)
LDL Cholesterol: 105 mg/dL — ABNORMAL HIGH (ref 0–99)
NonHDL: 177.91
Total CHOL/HDL Ratio: 6
Triglycerides: 365 mg/dL — ABNORMAL HIGH (ref 0.0–149.0)
VLDL: 73 mg/dL — ABNORMAL HIGH (ref 0.0–40.0)

## 2023-06-12 LAB — BASIC METABOLIC PANEL
BUN: 14 mg/dL (ref 6–23)
CO2: 25 meq/L (ref 19–32)
Calcium: 9.4 mg/dL (ref 8.4–10.5)
Chloride: 106 meq/L (ref 96–112)
Creatinine, Ser: 0.64 mg/dL (ref 0.40–1.20)
GFR: 113.63 mL/min (ref >60.00)
Glucose, Bld: 91 mg/dL (ref 70–99)
Potassium: 4.1 meq/L (ref 3.5–5.1)
Sodium: 139 meq/L (ref 135–145)

## 2023-06-12 LAB — TSH: TSH: 2.52 u[IU]/mL (ref 0.35–5.50)

## 2023-06-12 LAB — HEPATIC FUNCTION PANEL
ALT: 21 U/L (ref 0–35)
AST: 19 U/L (ref 0–37)
Albumin: 4.4 g/dL (ref 3.5–5.2)
Alkaline Phosphatase: 64 U/L (ref 39–117)
Bilirubin, Direct: 0.1 mg/dL (ref 0.0–0.3)
Total Bilirubin: 0.4 mg/dL (ref 0.2–1.2)
Total Protein: 7.2 g/dL (ref 6.0–8.3)

## 2023-06-14 ENCOUNTER — Ambulatory Visit: Payer: 59 | Admitting: Internal Medicine

## 2023-08-01 ENCOUNTER — Encounter: Payer: Self-pay | Admitting: Internal Medicine

## 2023-08-01 ENCOUNTER — Ambulatory Visit (INDEPENDENT_AMBULATORY_CARE_PROVIDER_SITE_OTHER): Payer: 59 | Admitting: Internal Medicine

## 2023-08-01 DIAGNOSIS — I10 Essential (primary) hypertension: Secondary | ICD-10-CM

## 2023-08-01 NOTE — Progress Notes (Deleted)
Subjective:    Patient ID: Becky Mccoy, female    DOB: Nov 21, 1985, 37 y.o.   MRN: [id number]  Patient here for No chief complaint on file.   HPI Here for a follow up appt - f/u regarding her blood pressure and anxiety/stress. On prozac.    Past Medical History:  Diagnosis Date   COVID-19    04/2021 11/202   History of chicken pox    Hx: UTI (urinary tract infection)    Obsessive-compulsive and related disorder    OCD (obsessive compulsive disorder)    Past Surgical History:  Procedure Laterality Date   WISDOM TOOTH EXTRACTION     Family History  Problem Relation Age of Onset   Healthy Mother    Healthy Father    Cancer - Other Maternal Grandfather        stomach cancer   Cancer - Other Paternal Grandfather        Bone cancer   Social History   Socioeconomic History   Marital status: Married    Spouse name: Not on file   Number of children: Not on file   Years of education: Not on file   Highest education level: Not on file  Occupational History   Not on file  Tobacco Use   Smoking status: Never   Smokeless tobacco: Never  Vaping Use   Vaping status: Never Used  Substance and Sexual Activity   Alcohol use: No    Alcohol/week: 0.0 standard drinks of alcohol   Drug use: No   Sexual activity: Yes    Birth control/protection: None    Comment: Vasectomy  Other Topics Concern   Not on file  Social History Narrative   Not on file   Social Drivers of Health   Financial Resource Strain: Not on file  Food Insecurity: Not on file  Transportation Needs: Not on file  Physical Activity: Not on file  Stress: Not on file  Social Connections: Not on file     Review of Systems     Objective:     There were no vitals taken for this visit. Wt Readings from Last 3 Encounters:  03/13/23 173 lb (78.5 kg)  11/29/22 169 lb (76.7 kg)  09/28/22 174 lb (78.9 kg)    Physical Exam   Outpatient Encounter Medications as of 08/01/2023  Medication Sig    amLODipine (NORVASC) 2.5 MG tablet Take 1 tablet (2.5 mg total) by mouth daily.   FLUoxetine (PROZAC) 40 MG capsule Take 1 capsule (40 mg total) by mouth daily.   No facility-administered encounter medications on file as of 08/01/2023.     Lab Results  Component Value Date   WBC 5.8 06/12/2023   HGB 13.8 06/12/2023   HCT 42.3 06/12/2023   PLT 320.0 06/12/2023   GLUCOSE 91 06/12/2023   CHOL 215 (H) 06/12/2023   TRIG 365.0 (H) 06/12/2023   HDL 36.70 (L) 06/12/2023   LDLDIRECT 110.0 03/07/2023   LDLCALC 105 (H) 06/12/2023   ALT 21 06/12/2023   AST 19 06/12/2023   NA 139 06/12/2023   K 4.1 06/12/2023   CL 106 06/12/2023   CREATININE 0.64 06/12/2023   BUN 14 06/12/2023   CO2 25 06/12/2023   TSH 2.52 06/12/2023    MM 3D DIAGNOSTIC MAMMOGRAM UNILATERAL RIGHT BREAST Result Date: 05/25/2023 CLINICAL DATA:  37 year old female presenting as a recall from baseline screening for possible right breast asymmetry. No significant family history of breast cancer. EXAM: DIGITAL DIAGNOSTIC UNILATERAL RIGHT MAMMOGRAM  WITH TOMOSYNTHESIS AND CAD; ULTRASOUND RIGHT BREAST LIMITED TECHNIQUE: Right digital diagnostic mammography and breast tomosynthesis was performed. The images were evaluated with computer-aided detection. ; Targeted ultrasound examination of the right breast was performed COMPARISON:  Previous exam(s). ACR Breast Density Category b: There are scattered areas of fibroglandular density. FINDINGS: Mammogram: Right breast: Spot compression tomosynthesis views of the right breast performed for a questioned asymmetry in the upper slightly inner right breast. On the additional imaging there is a persistent asymmetry which could represent a band of normal tissue. There is no mass or distortion. On physical exam of the upper central to inner right breast I do not feel a discrete mass or focal area of thickening. Ultrasound: Targeted ultrasound performed in the right breast from 12:00 to 1:00  demonstrating no cystic or solid mass. There is a band of dense tissue which corresponds to the mammographic finding. IMPRESSION: Benign asymmetry in the upper slightly inner right breast corresponding to a band of normal tissue sonographically. RECOMMENDATION: Screening mammogram in one year.(Code:SM-B-01Y) I have discussed the findings and recommendations with the patient. If applicable, a reminder letter will be sent to the patient regarding the next appointment. BI-RADS CATEGORY  2: Benign. Electronically Signed   By: Emmaline Kluver M.D.   On: 05/25/2023 14:48   [place] LIMITED ULTRASOUND INCLUDING AXILLA RIGHT BREAST Result Date: 05/25/2023 CLINICAL DATA:  37 year old female presenting as a recall from baseline screening for possible right breast asymmetry. No significant family history of breast cancer. EXAM: DIGITAL DIAGNOSTIC UNILATERAL RIGHT MAMMOGRAM WITH TOMOSYNTHESIS AND CAD; ULTRASOUND RIGHT BREAST LIMITED TECHNIQUE: Right digital diagnostic mammography and breast tomosynthesis was performed. The images were evaluated with computer-aided detection. ; Targeted ultrasound examination of the right breast was performed COMPARISON:  Previous exam(s). ACR Breast Density Category b: There are scattered areas of fibroglandular density. FINDINGS: Mammogram: Right breast: Spot compression tomosynthesis views of the right breast performed for a questioned asymmetry in the upper slightly inner right breast. On the additional imaging there is a persistent asymmetry which could represent a band of normal tissue. There is no mass or distortion. On physical exam of the upper central to inner right breast I do not feel a discrete mass or focal area of thickening. Ultrasound: Targeted ultrasound performed in the right breast from 12:00 to 1:00 demonstrating no cystic or solid mass. There is a band of dense tissue which corresponds to the mammographic finding. IMPRESSION: Benign asymmetry in the upper slightly inner  right breast corresponding to a band of normal tissue sonographically. RECOMMENDATION: Screening mammogram in one year.(Code:SM-B-01Y) I have discussed the findings and recommendations with the patient. If applicable, a reminder letter will be sent to the patient regarding the next appointment. BI-RADS CATEGORY  2: Benign. Electronically Signed   By: Emmaline Kluver M.D.   On: 05/25/2023 14:48       Assessment & Plan:  There are no diagnoses linked to this encounter.   Dale [place], MD

## 2023-08-01 NOTE — Progress Notes (Signed)
Patient ID: Becky Mccoy, female   DOB: November 30, 1985, 37 y.o.   MRN: [id number] Did not show for appt.

## 2023-10-04 ENCOUNTER — Ambulatory Visit
Admission: EM | Admit: 2023-10-04 | Discharge: 2023-10-04 | Disposition: A | Discharge status: Home / Self Care | Payer: BC Managed Care – PPO | Attending: Physician Assistant | Admitting: Physician Assistant

## 2023-10-04 DIAGNOSIS — Z20828 Contact with and (suspected) exposure to other viral communicable diseases: Secondary | ICD-10-CM | POA: Diagnosis not present

## 2023-10-04 DIAGNOSIS — J020 Streptococcal pharyngitis: Secondary | ICD-10-CM | POA: Diagnosis not present

## 2023-10-04 DIAGNOSIS — R051 Acute cough: Secondary | ICD-10-CM | POA: Insufficient documentation

## 2023-10-04 DIAGNOSIS — R509 Fever, unspecified: Secondary | ICD-10-CM | POA: Insufficient documentation

## 2023-10-04 DIAGNOSIS — R5383 Other fatigue: Secondary | ICD-10-CM | POA: Insufficient documentation

## 2023-10-04 LAB — GROUP A STREP BY PCR: Group A Strep by PCR: NOT DETECTED

## 2023-10-04 MED ORDER — AMOXICILLIN 500 MG PO CAPS
500.0000 mg | ORAL_CAPSULE | Freq: Two times a day (BID) | ORAL | 0 refills | Status: AC
Start: 2023-10-04 — End: 2023-10-14

## 2023-10-04 NOTE — Discharge Instructions (Addendum)
-  Strep was negative but given your significant sore throat you still may have strep.  You were exposed to strep and flu so you could have both conditions.  I sent antibiotics for possible strep.  Hopefully you break a fever in the next 2 to 3 days. - Increase rest and fluids. -Isolate until fever free 24 hours and feeling better.

## 2023-10-04 NOTE — ED Provider Notes (Signed)
[facility]    CSN: [id number] Arrival date & time: 10/04/23  1005      History   Chief Complaint Chief Complaint  Patient presents with   Cough   Fever   Sore Throat   Dizziness    HPI Becky Mccoy is a 38 y.o. female presenting for fever up to 101 degrees, fatigue, cough, congestion, sore throat and bodyaches x 3-4 days.  Denies ear pain, sinus pain, chest pain, shortness of breath, abdominal pain, vomiting or diarrhea.  Patient has been exposed to influenza by her husband and her child is also ill. Patient has been taking over-the-counter meds and Tylenol was taken a couple of hours ago. No other complaints.  HPI  Past Medical History:  Diagnosis Date   COVID-19    04/2021 11/202   History of chicken pox    Hx: UTI (urinary tract infection)    Obsessive-compulsive and related disorder    OCD (obsessive compulsive disorder)     Patient Active Problem List   Diagnosis Date Noted   Anxiety 07/08/2022   Back pain 05/24/2022   Acid reflux 04/02/2022   Fatty liver 04/02/2022   Eosinophilia 04/02/2022   Headache 02/27/2022   Hypertension, essential 02/27/2022   Colitis 12/12/2021   Depression, major, recurrent (HCC) 04/18/2021   Weight gain 04/18/2021   Breast feeding status of mother 10/27/2020   Vaginal discharge 10/17/2020   Leg skin lesion, right 10/17/2020   Premature rupture of membranes (PROM) affecting fourth pregnancy 08/29/2019   Normal vaginal delivery 08/29/2019   Postpartum care following vaginal delivery 08/29/2019   Encounter for supervision of low-risk pregnancy, antepartum 01/09/2019   Hypercholesterolemia 06/18/2018   Nipple discharge 12/11/2016   Chest pain 12/11/2016   Health care maintenance 02/25/2015   Thyroid fullness 06/07/2014   OCD (obsessive compulsive disorder) 06/07/2014    Past Surgical History:  Procedure Laterality Date   WISDOM TOOTH EXTRACTION      OB History     Gravida  4   Para  3   Term  3    Preterm      AB  1   Living  3      SAB  1   IAB      Ectopic      Multiple  0   Live Births  1            Home Medications    Prior to Admission medications   Medication Sig Start Date End Date Taking? Authorizing Provider  amLODipine (NORVASC) 2.5 MG tablet Take 1 tablet (2.5 mg total) by mouth daily. 03/13/23  Yes Dale [place], MD  amoxicillin (AMOXIL) 500 MG capsule Take 1 capsule (500 mg total) by mouth 2 (two) times daily for 10 days. 10/04/23 10/14/23 Yes Shirlee Latch, PA-C  FLUoxetine (PROZAC) 40 MG capsule Take 1 capsule (40 mg total) by mouth daily. 03/13/23  Yes Dale [place], MD    Family History Family History  Problem Relation Age of Onset   Healthy Mother    Healthy Father    Cancer - Other Maternal Grandfather        stomach cancer   Cancer - Other Paternal Grandfather        Bone cancer    Social History Social History   Tobacco Use   Smoking status: Never   Smokeless tobacco: Never  Vaping Use   Vaping status: Never Used  Substance Use Topics   Alcohol use: No  Alcohol/week: 0.0 standard drinks of alcohol   Drug use: No     Allergies   Sulfa antibiotics   Review of Systems Review of Systems  Constitutional:  Positive for fatigue and fever. Negative for chills and diaphoresis.  HENT:  Positive for congestion, rhinorrhea and sore throat. Negative for ear pain, sinus pressure and sinus pain.   Respiratory:  Positive for cough and wheezing. Negative for shortness of breath.   Cardiovascular:  Negative for chest pain.  Gastrointestinal:  Negative for abdominal pain, nausea and vomiting.  Musculoskeletal:  Positive for myalgias.  Skin:  Negative for rash.  Neurological:  Negative for weakness and headaches.  Hematological:  Negative for adenopathy.     Physical Exam Triage Vital Signs ED Triage Vitals  Encounter Vitals Group     BP      Systolic BP Percentile      Diastolic BP Percentile      Pulse      Resp       Temp      Temp src      SpO2      Weight      Height      Head Circumference      Peak Flow      Pain Score      Pain Loc      Pain Education      Exclude from Growth Chart    No data found.  Updated Vital Signs BP 126/84 (BP Location: Right Arm)   Pulse 96   Temp 99.7 F (37.6 C) (Oral)   Resp 16   LMP 10/02/2023 (Exact Date)   SpO2 97%     Physical Exam Vitals and nursing note reviewed.  Constitutional:      General: She is not in acute distress.    Appearance: Normal appearance. She is not ill-appearing or toxic-appearing.  HENT:     Head: Normocephalic and atraumatic.     Nose: Congestion present.     Mouth/Throat:     Mouth: Mucous membranes are moist.     Pharynx: Oropharynx is clear. Posterior oropharyngeal erythema present.  Eyes:     General: No scleral icterus.       Right eye: No discharge.        Left eye: No discharge.     Conjunctiva/sclera: Conjunctivae normal.  Cardiovascular:     Rate and Rhythm: Normal rate and regular rhythm.     Heart sounds: Normal heart sounds.  Pulmonary:     Effort: Pulmonary effort is normal. No respiratory distress.     Breath sounds: Normal breath sounds. No wheezing, rhonchi or rales.  Musculoskeletal:     Cervical back: Neck supple.  Skin:    General: Skin is dry.  Neurological:     General: No focal deficit present.     Mental Status: She is alert. Mental status is at baseline.     Motor: No weakness.     Gait: Gait normal.  Psychiatric:        Mood and Affect: Mood normal.        Behavior: Behavior normal.      UC Treatments / Results  Labs (all labs ordered are listed, but only abnormal results are displayed) Labs Reviewed  GROUP A STREP BY PCR    EKG   Radiology No results found.  Procedures Procedures (including critical care time)  Medications Ordered in UC Medications - No data to display  Initial Impression / Assessment and  Plan / UC Course  I have reviewed the triage vital signs  and the nursing notes.  Pertinent labs & imaging results that were available during my care of the patient were reviewed by me and considered in my medical decision making (see chart for details).   38 year old female presents for fever, fatigue, cough, congestion, sore throat x 3 to 4 days.  Has been exposed to the flu and her child is sick as well.  Temperature this morning was up to 101 degrees.  She has taken Tylenol.  Vitals are stable and normal at this time.  She is overall well-appearing.  On exam has nasal congestion and erythema of posterior pharynx.  Chest clear.  Heart regular rate and rhythm.  Strep testing obtained.  I did offer COVID and flu testing but explained that if she is positive for influenza as she is outside the treatment window for Tamiflu to be effective so would likely not change management.  She agrees to hold off on viral testing at this time.  Strep negative.  Reviewed result with patient.  Explained that she still may have strep given her exposure.  Her son tested positive for strep today.  She also may have influenza given her exposure.  Will send amoxicillin to pharmacy for suspected strep.  Also reviewed current CDC guidelines, isolation protocol and ED precautions for flu.  Follow-up here as needed.  Work note given.  Acute illness with systemic symptoms.   Final Clinical Impressions(s) / UC Diagnoses   Final diagnoses:  Strep pharyngitis  Fever, unspecified  Acute cough  Exposure to the flu  Other fatigue     Discharge Instructions      -Strep was negative but given your significant sore throat you still may have strep.  You were exposed to strep and flu so you could have both conditions.  I sent antibiotics for possible strep.  Hopefully you break a fever in the next 2 to 3 days. - Increase rest and fluids. -Isolate until fever free 24 hours and feeling better.     ED Prescriptions     Medication Sig Dispense Auth. Provider   amoxicillin  (AMOXIL) 500 MG capsule Take 1 capsule (500 mg total) by mouth 2 (two) times daily for 10 days. 20 capsule Shirlee Latch, PA-C      PDMP not reviewed this encounter.   Shirlee Latch, PA-C 10/04/23 1133

## 2023-10-04 NOTE — ED Triage Notes (Signed)
Sx 4 days  Fever Cough Sore throat Dizzy Tylenol at 530 am

## 2023-10-08 ENCOUNTER — Ambulatory Visit: Payer: Self-pay | Admitting: Internal Medicine

## 2023-10-08 NOTE — Telephone Encounter (Signed)
 Copied from CRM [id number]. Topic: Clinical - Red Word Triage >> Oct 08, 2023  1:22 PM Isabell A wrote: [facility] that prompted transfer to Nurse Triage: Patient seen at a walk in clinic on 2/20 for strep throat, states it was negative, she's been taking antibiotics but is not feeling any better - complaining on wheezing and a terrible cough.   Chief Complaint: Cough Symptoms: Cough, fever, dizziness, sore throat  Frequency: Frequent  Disposition: [] ED /[] Urgent Care (no appt availability in office) / [x] Appointment(In office/virtual)/ []  [place] [facility]/ [] Home Care/ [] Refused Recommended Disposition /[] [place] [facility]/ []  Follow-up with PCP Additional Notes: Patient states she has had a fever for about 8 days with cough and sore throat. She states that she was seen 4 days ago at urgent care and treated with an antibiotic for strep throat. She states that her symptoms have not improved and that she has been experiencing a worsening cough since that time. She states that she is also experiencing dizziness "like my head is congested." Appointment made for the patient tomorrow for evaluation of her symptoms.     Reason for Disposition  Fever present > 3 days (72 hours)  Answer Assessment - Initial Assessment Questions 1. ONSET: "When did the cough begin?"      4-5 days for cough  2. SEVERITY: "How bad is the cough today?"      Moderate to severe  3. SPUTUM: "Describe the color of your sputum" (none, dry cough; clear, white, yellow, green)     Some, green/white/clear 4. HEMOPTYSIS: "Are you coughing up any blood?" If so ask: "How much?" (flecks, streaks, tablespoons, etc.)     No 5. DIFFICULTY BREATHING: "Are you having difficulty breathing?" If Yes, ask: "How bad is it?" (e.g., mild, moderate, severe)    - MILD: No SOB at rest, mild SOB with walking, speaks normally in sentences, can lie down, no retractions, pulse < 100.    - MODERATE: SOB at rest, SOB with minimal exertion and  prefers to sit, cannot lie down flat, speaks in phrases, mild retractions, audible wheezing, pulse 100-120.    - SEVERE: Very SOB at rest, speaks in single words, struggling to breathe, sitting hunched forward, retractions, pulse > 120      Mild to moderate  6. FEVER: "Do you have a fever?" If Yes, ask: "What is your temperature, how was it measured, and when did it start?"     Yes for 8 days  7. CARDIAC HISTORY: "Do you have any history of heart disease?" (e.g., heart attack, congestive heart failure)      No 8. LUNG HISTORY: "Do you have any history of lung disease?"  (e.g., pulmonary embolus, asthma, emphysema)     No 9. PE RISK FACTORS: "Do you have a history of blood clots?" (or: recent major surgery, recent prolonged travel, bedridden)     No 10. OTHER SYMPTOMS: "Do you have any other symptoms?" (e.g., runny nose, wheezing, chest pain)       Sore throat, dizziness, fever, congested  11. PREGNANCY: "Is there any chance you are pregnant?" "When was your last menstrual period?"       No 12. TRAVEL: "Have you traveled out of the country in the last month?" (e.g., travel history, exposures)       No  Protocols used: Cough - Acute Productive-A-AH

## 2023-10-08 NOTE — Telephone Encounter (Signed)
 LM for patient. Ok to relay.

## 2023-10-08 NOTE — Telephone Encounter (Signed)
 Agree if wheezing and cough, needs to be evaluated.

## 2023-10-08 NOTE — Telephone Encounter (Signed)
 Called and she stated that she will go to [facility] tonight and call and cancel her appointment if she is seen. I told her we would keep her appointment for tomorrow in case she still needs to be seen.

## 2023-10-09 ENCOUNTER — Ambulatory Visit: Payer: BC Managed Care – PPO | Admitting: Family Medicine

## 2023-10-09 ENCOUNTER — Other Ambulatory Visit: Payer: Self-pay | Admitting: Family Medicine

## 2023-10-09 ENCOUNTER — Ambulatory Visit (INDEPENDENT_AMBULATORY_CARE_PROVIDER_SITE_OTHER): Payer: BC Managed Care – PPO

## 2023-10-09 VITALS — BP 120/88 | HR 80 | Temp 99.2°F | Resp 18 | Ht 66.0 in | Wt 172.1 lb

## 2023-10-09 DIAGNOSIS — J4521 Mild intermittent asthma with (acute) exacerbation: Secondary | ICD-10-CM

## 2023-10-09 DIAGNOSIS — R059 Cough, unspecified: Secondary | ICD-10-CM

## 2023-10-09 DIAGNOSIS — R0981 Nasal congestion: Secondary | ICD-10-CM | POA: Diagnosis not present

## 2023-10-09 DIAGNOSIS — R509 Fever, unspecified: Secondary | ICD-10-CM | POA: Diagnosis not present

## 2023-10-09 DIAGNOSIS — R053 Chronic cough: Secondary | ICD-10-CM | POA: Diagnosis not present

## 2023-10-09 DIAGNOSIS — J309 Allergic rhinitis, unspecified: Secondary | ICD-10-CM | POA: Diagnosis not present

## 2023-10-09 LAB — CBC WITH DIFFERENTIAL/PLATELET
Basophils Absolute: 0 10*3/uL (ref 0.0–0.1)
Basophils Relative: 0.4 % (ref 0.0–3.0)
Eosinophils Absolute: 0.4 10*3/uL (ref 0.0–0.7)
Eosinophils Relative: 6.8 % — ABNORMAL HIGH (ref 0.0–5.0)
HCT: 43.2 % (ref 36.0–46.0)
Hemoglobin: 14.4 g/dL (ref 12.0–15.0)
Lymphocytes Relative: 44 % (ref 12.0–46.0)
Lymphs Abs: 2.9 10*3/uL (ref 0.7–4.0)
MCHC: 33.3 g/dL (ref 30.0–36.0)
MCV: 88.7 fL (ref 78.0–100.0)
Monocytes Absolute: 0.4 10*3/uL (ref 0.1–1.0)
Monocytes Relative: 6.5 % (ref 3.0–12.0)
Neutro Abs: 2.8 10*3/uL (ref 1.4–7.7)
Neutrophils Relative %: 42.3 % — ABNORMAL LOW (ref 43.0–77.0)
Platelets: 346 10*3/uL (ref 150.0–400.0)
RBC: 4.87 Mil/uL (ref 3.87–5.11)
RDW: 13.4 % (ref 11.5–15.5)
WBC: 6.6 10*3/uL (ref 4.0–10.5)

## 2023-10-09 MED ORDER — AZELASTINE HCL 0.1 % NA SOLN: 2.0000 | Freq: Two times a day (BID) | NASAL | 12 refills | Status: DC

## 2023-10-09 MED ORDER — FLUTICASONE-SALMETEROL 115-21 MCG/ACT IN AERO
2.0000 | INHALATION_SPRAY | Freq: Two times a day (BID) | RESPIRATORY_TRACT | 12 refills | Status: DC
Start: 2023-10-09 — End: 2023-10-28

## 2023-10-09 MED ORDER — AIRSUPRA 90-80 MCG/ACT IN AERO: 1.0000 | INHALATION_SPRAY | RESPIRATORY_TRACT | 2 refills | Status: DC | PRN

## 2023-10-09 MED ORDER — FLUTICASONE PROPIONATE 50 MCG/ACT NA SUSP: 2.0000 | Freq: Two times a day (BID) | NASAL | 6 refills | Status: DC

## 2023-10-09 MED ORDER — PREDNISONE 20 MG PO TABS: 40.0000 mg | ORAL_TABLET | Freq: Every day | ORAL | 0 refills | Status: AC

## 2023-10-09 MED ORDER — IPRATROPIUM BROMIDE 0.03 % NA SOLN: 2.0000 | Freq: Two times a day (BID) | NASAL | 12 refills | Status: DC | PRN

## 2023-10-09 NOTE — Progress Notes (Signed)
 SUBJECTIVE:   Chief Complaint  Patient presents with   Cough    X 1 week   Fever    8 days straight and today is the first day she has not had a fever.   HPI Presents for acute visit  Discussed the use of AI scribe software for clinical note transcription with the patient, who gave verbal consent to proceed.  History of Present Illness  Becky Mccoy is an 38 year old female who presents with persistent fever and respiratory symptoms following exposure to influenza A and strep throat.  She has been experiencing a persistent fever for eight days, with temperatures reaching up to 102.67F four days ago, now reduced to a low-grade fever around 967F. She has been alternating Tylenol and ibuprofen without resolution. She also reports a sore throat, significant congestion, intermittent chills, ear pain, dizziness, and a cough triggered by deep breaths, which she describes as being more in the upper respiratory tract rather than the lungs. No runny nose, postnasal drip, wheezing, or chest pain. She had COVID-19 two years ago and notes her cough has not been the same since.  She was exposed to influenza A through her husband, who tested positive, and her son, who had strep throat. Although she did not test positive for strep throat, she was given antibiotics. She did not receive treatment for influenza A as it was deemed too late for Tamiflu administration.  She has been on amoxicillin for six days for a presumed bacterial infection but reports no improvement in her symptoms. She has not had a chest x-ray or been given an inhaler.  No history of asthma or smoking. Her mother developed asthma in her thirties.  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  PERTINENT PMH / PSH: As above  OBJECTIVE:  BP 120/88   Pulse 80   Temp 99.2 F (37.3 C)   Resp 18   Ht 5\' 6"  (1.676 m)   Wt 172 lb 2 oz (78.1 kg)   LMP 10/02/2023 (Exact Date)   SpO2 99%   BMI 27.78 kg/m     Physical Exam Vitals reviewed.  Constitutional:      General: She is not in acute distress.    Appearance: Normal appearance. She is not ill-appearing, toxic-appearing or diaphoretic.  HENT:     Right Ear: Tympanic membrane, ear canal and external ear normal.     Left Ear: Tympanic membrane, ear canal and external ear normal.     Nose: Congestion and rhinorrhea present.     Mouth/Throat:     Mouth: Mucous membranes are moist.     Pharynx: Posterior oropharyngeal erythema present.     Tonsils: No tonsillar exudate.  Eyes:     General:        Right eye: No discharge.        Left eye: No discharge.     Conjunctiva/sclera: Conjunctivae normal.  Cardiovascular:     Rate and Rhythm: Normal rate and regular rhythm.     Heart sounds: Normal heart sounds.  Pulmonary:     Effort: Pulmonary effort is normal.     Breath sounds: Wheezing and rhonchi present.  Abdominal:     General: Bowel sounds are normal.  Musculoskeletal:        General: Normal range of motion.  Skin:    General: Skin is warm and dry.  Neurological:     General: No focal deficit present.  Mental Status: She is alert and oriented to person, place, and time. Mental status is at baseline.  Psychiatric:        Mood and Affect: Mood normal.        Behavior: Behavior normal.        Thought Content: Thought content normal.        Judgment: Judgment normal.           10/09/2023   11:24 AM 03/13/2023    9:57 AM 11/29/2022    8:45 AM 07/20/2022    4:12 PM 07/04/2022   12:54 PM  Depression screen PHQ 2/9  Decreased Interest 0 0 0 0 3  Down, Depressed, Hopeless 0 0 0 1 2  PHQ - 2 Score 0 0 0 1 5  Altered sleeping 0 1 0  2  Tired, decreased energy 0 1 0  3  Change in appetite 0 1 0  2  Feeling bad or failure about yourself  0 0 0  1  Trouble concentrating 0 0 0  1  Moving slowly or fidgety/restless 0 0 0  1  Suicidal thoughts 0 0 0  0  PHQ-9 Score 0 3 0  15  Difficult doing work/chores Not difficult at all  Not difficult at all Not difficult at all  Very difficult      10/09/2023   11:24 AM 07/04/2022   12:55 PM  GAD 7 : Generalized Anxiety Score  Nervous, Anxious, on Edge 0 3  Control/stop worrying 0 2  Worry too much - different things 0 3  Trouble relaxing 0 3  Restless 0 2  Easily annoyed or irritable 0 3  Afraid - awful might happen 0 1  Total GAD 7 Score 0 17  Anxiety Difficulty Not difficult at all Very difficult    ASSESSMENT/PLAN:  Cough in adult -     DG Chest 2 View; Future  Fever, unspecified fever cause -     CBC with Differential/Platelet  Nasal congestion -     Fluticasone Propionate; Place 2 sprays into both nostrils in the morning and at bedtime.  Dispense: 16 g; Refill: 6 -     Azelastine HCl; Place 2 sprays into both nostrils 2 (two) times daily. Use in each nostril as directed  Dispense: 30 mL; Refill: 12  Allergic rhinitis, unspecified seasonality, unspecified trigger -     Ipratropium Bromide; Place 2 sprays into both nostrils every 12 (twelve) hours as needed for rhinitis.  Dispense: 30 mL; Refill: 12  Mild intermittent reactive airway disease with acute exacerbation Assessment & Plan: Likely viral etiology given exposure to confirmed Influenza A and negative strep test. Symptoms include fever, cough, congestion, and dizziness. Suspect exacerbation of reactive airway disease -Order chest x-ray to further evaluate respiratory status. -Prescribe Prednisone 40mg  for 5 days to reduce inflammation. -Prescribe Albuterol inhaler for use 1-2 puffs every 4-6 hours as needed for shortness of breath. -Prescribe Advair Diskus for use twice daily to manage symptoms. -Prescribe Flonase two sprays in each nostril twice daily to manage nasal congestion. -Prescribe Astelin two sprays twice daily for potential environmental allergies. -Prescribe Ipratropium nasal spray as needed for runny nose. -Recommend over-the-counter Nyquil for symptomatic relief, particularly before  bed.  Orders: -     predniSONE; Take 2 tablets (40 mg total) by mouth daily with breakfast for 5 days.  Dispense: 10 tablet; Refill: 0 -     Airsupra; Inhale 1-2 puffs into the lungs every 4 (four) hours as needed.  Dispense: 10.7 g; Refill: 2 -     Fluticasone-Salmeterol; Inhale 2 puffs into the lungs 2 (two) times daily.  Dispense: 1 each; Refill: 12    Potential Underlying Asthma Patient reports a change in cough since a previous COVID-19 infection. Family history of adult-onset asthma. Further evaluation needed once acute symptoms resolve. -Consider referral to pulmonology and/or pulmonary function tests once acute symptoms resolve.    PDMP reviewed  Return in about 2 days (around 10/11/2023) for PCP.  Dana Allan, MD

## 2023-10-09 NOTE — Telephone Encounter (Signed)
 Please notify her that flonase not covered by insurance - is over the counter.

## 2023-10-09 NOTE — Patient Instructions (Addendum)
 It was a pleasure meeting you today. Thank you for allowing me to take part in your health care.  Our goals for today as we discussed include:  Start Prednisone 40 mg daily for 5 days Start Airsupra 1-2 puffs every 4-6 hours as needed for shortness of breath or wheezing.  Max of 12 puffs in 24 hours Start Advair 2 puffs 2 times a day For both inhalers rinse mouth after each use to prevent any oral candida infections as they contain steroids.  Start Flonase 2 sprays 2 times a day Start Astelin 2 sprays 2 times a day Use Ipratropium 2 spray as needed for runny nose  You can take Tylenol and/or Ibuprofen as needed for fever reduction and pain relief.   For cough: honey 1/2 to 1 teaspoon (you can dilute the honey in water or another fluid).  You can also use guaifenesin and dextromethorphan for cough. You can use a humidifier for chest congestion and cough.  If you don't have a humidifier, you can sit in the bathroom with the hot shower running.      For sore throat: try warm salt water gargles, cepacol lozenges, throat spray, warm tea or water with lemon/honey, popsicles or ice, or OTC cold relief medicine for throat discomfort.   For congestion: take a daily anti-histamine like Zyrtec, Claritin, and a oral decongestant, such as pseudoephedrine.  You can also use Flonase 1-2 sprays in each nostril daily.   It is important to stay hydrated: drink plenty of fluids (water, gatorade/powerade/pedialyte, juices, or teas) to keep your throat moisturized and help further relieve irritation/discomfort.   Will check blood work today  Follow up in 3 days   This is a list of the screening recommended for you and due dates:  Health Maintenance  Topic Date Due   Hepatitis C Screening  Never done   COVID-19 Vaccine (1 - 2024-25 season) Never done   Pap with HPV screening  10/12/2025   DTaP/Tdap/Td vaccine (2 - Td or Tdap) 07/22/2029   Flu Shot  Completed   HIV Screening  Completed   HPV Vaccine   Aged Out      If you have any questions or concerns, please do not hesitate to call the office at [phone].  I look forward to our next visit and until then take care and stay safe.  Regards,   Dana Allan, MD   [facility]

## 2023-10-11 ENCOUNTER — Ambulatory Visit: Payer: BC Managed Care – PPO | Admitting: Family Medicine

## 2023-10-14 ENCOUNTER — Encounter: Payer: Self-pay | Admitting: Family Medicine

## 2023-10-14 DIAGNOSIS — J45909 Unspecified asthma, uncomplicated: Secondary | ICD-10-CM | POA: Insufficient documentation

## 2023-10-14 DIAGNOSIS — R509 Fever, unspecified: Secondary | ICD-10-CM | POA: Insufficient documentation

## 2023-10-14 DIAGNOSIS — J309 Allergic rhinitis, unspecified: Secondary | ICD-10-CM | POA: Insufficient documentation

## 2023-10-14 DIAGNOSIS — R0981 Nasal congestion: Secondary | ICD-10-CM | POA: Insufficient documentation

## 2023-10-14 DIAGNOSIS — R059 Cough, unspecified: Secondary | ICD-10-CM | POA: Insufficient documentation

## 2023-10-14 NOTE — Assessment & Plan Note (Signed)
 Likely viral etiology given exposure to confirmed Influenza A and negative strep test. Symptoms include fever, cough, congestion, and dizziness. Suspect exacerbation of reactive airway disease -Order chest x-ray to further evaluate respiratory status. -Prescribe Prednisone 40mg  for 5 days to reduce inflammation. -Prescribe Albuterol inhaler for use 1-2 puffs every 4-6 hours as needed for shortness of breath. -Prescribe Advair Diskus for use twice daily to manage symptoms. -Prescribe Flonase two sprays in each nostril twice daily to manage nasal congestion. -Prescribe Astelin two sprays twice daily for potential environmental allergies. -Prescribe Ipratropium nasal spray as needed for runny nose. -Recommend over-the-counter Nyquil for symptomatic relief, particularly before bed.

## 2023-10-21 NOTE — Progress Notes (Signed)
 Subjective:    Patient ID: Becky Mccoy, female    DOB: Jun 06, 1986, 38 y.o.   MRN: [id number]  Patient here for  Chief Complaint  Patient presents with   Medical Management of Chronic Issues    Overdue f/u, discuss labs & weight gain    HPI Here for a work in appt. Recently evaluated for nasal congestion and cough. Treated with prednisone and inhalers. Cough and congestion better. She is concerned regarding her weight. Discussed diet and exercise. She is interested in GLP 1 agonist. Discussed medication and possible side effects and contraindications. She is interested in starting. No concerns regarding pregnancy. Not breast feeding.    Past Medical History:  Diagnosis Date   COVID-19    04/2021 11/202   History of chicken pox    Hx: UTI (urinary tract infection)    Obsessive-compulsive and related disorder    OCD (obsessive compulsive disorder)    Past Surgical History:  Procedure Laterality Date   WISDOM TOOTH EXTRACTION     Family History  Problem Relation Age of Onset   Healthy Mother    Healthy Father    Cancer - Other Maternal Grandfather        stomach cancer   Cancer - Other Paternal Grandfather        Bone cancer   Social History   Socioeconomic History   Marital status: Married    Spouse name: Not on file   Number of children: Not on file   Years of education: Not on file   Highest education level: Some college, no degree  Occupational History   Not on file  Tobacco Use   Smoking status: Never   Smokeless tobacco: Never  Vaping Use   Vaping status: Never Used  Substance and Sexual Activity   Alcohol use: No    Alcohol/week: 0.0 standard drinks of alcohol   Drug use: No   Sexual activity: Yes    Birth control/protection: None    Comment: Vasectomy  Other Topics Concern   Not on file  Social History Narrative   Not on file   Social Drivers of Health   Financial Resource Strain: Low Risk  (10/09/2023)   Overall Financial Resource Strain  (CARDIA)    Difficulty of Paying Living Expenses: Not very hard  Food Insecurity: No Food Insecurity (10/09/2023)   Hunger Vital Sign    Worried About Running Out of Food in the Last Year: Never true    Ran Out of Food in the Last Year: Never true  Transportation Needs: No Transportation Needs (10/09/2023)   PRAPARE - Administrator, Civil Service (Medical): No    Lack of Transportation (Non-Medical): No  Physical Activity: Sufficiently Active (10/09/2023)   Exercise Vital Sign    Days of Exercise per Week: 3 days    Minutes of Exercise per Session: 50 min  Stress: No Stress Concern Present (10/09/2023)   [employer] of Occupational Health - Occupational Stress Questionnaire    Feeling of Stress : Not at all  Social Connections: Socially Integrated (10/09/2023)   Social Connection and Isolation Panel [NHANES]    Frequency of Communication with Friends and Family: More than three times a week    Frequency of Social Gatherings with Friends and Family: Three times a week    Attends Religious Services: More than 4 times per year    Active Member of Clubs or Organizations: Yes    Attends Banker Meetings: More than 4  times per year    Marital Status: Married     Review of Systems  Constitutional:  Negative for appetite change and unexpected weight change.  HENT:  Negative for congestion and sinus pressure.   Respiratory:  Negative for cough, chest tightness and shortness of breath.   Cardiovascular:  Negative for chest pain, palpitations and leg swelling.  Gastrointestinal:  Negative for abdominal pain, diarrhea, nausea and vomiting.  Genitourinary:  Negative for difficulty urinating and dysuria.  Musculoskeletal:  Negative for joint swelling and myalgias.  Skin:  Negative for color change and rash.  Neurological:  Negative for dizziness and headaches.  Psychiatric/Behavioral:  Negative for agitation and dysphoric mood.        Objective:     BP 122/82  (Cuff Size: Normal)   Pulse (!) 102   Temp 98.5 F (36.9 C) (Oral)   Resp 17   Ht 5\' 6"  (1.676 m)   Wt 176 lb 8 oz (80.1 kg)   LMP 10/02/2023 (Exact Date)   SpO2 98%   BMI 28.49 kg/m  Wt Readings from Last 3 Encounters:  10/22/23 176 lb 8 oz (80.1 kg)  10/09/23 172 lb 2 oz (78.1 kg)  03/13/23 173 lb (78.5 kg)    Physical Exam Vitals reviewed.  Constitutional:      General: She is not in acute distress.    Appearance: Normal appearance.  HENT:     Head: Normocephalic and atraumatic.     Right Ear: External ear normal.     Left Ear: External ear normal.     Mouth/Throat:     Pharynx: No oropharyngeal exudate or posterior oropharyngeal erythema.  Eyes:     General: No scleral icterus.       Right eye: No discharge.        Left eye: No discharge.     Conjunctiva/sclera: Conjunctivae normal.  Neck:     Thyroid: No thyromegaly.  Cardiovascular:     Rate and Rhythm: Normal rate and regular rhythm.  Pulmonary:     Effort: No respiratory distress.     Breath sounds: Normal breath sounds. No wheezing.  Abdominal:     General: Bowel sounds are normal.     Palpations: Abdomen is soft.     Tenderness: There is no abdominal tenderness.  Musculoskeletal:        General: No swelling or tenderness.     Cervical back: Neck supple. No tenderness.  Lymphadenopathy:     Cervical: No cervical adenopathy.  Skin:    Findings: No erythema or rash.  Neurological:     Mental Status: She is alert.  Psychiatric:        Mood and Affect: Mood normal.        Behavior: Behavior normal.         Outpatient Encounter Medications as of 10/22/2023  Medication Sig   tirzepatide (ZEPBOUND) 2.5 MG/0.5ML injection vial Inject 2.5 mg into the skin once a week.   [DISCONTINUED] amLODipine (NORVASC) 2.5 MG tablet Take 1 tablet (2.5 mg total) by mouth daily.   [DISCONTINUED] FLUoxetine (PROZAC) 40 MG capsule Take 1 capsule (40 mg total) by mouth daily.   amLODipine (NORVASC) 2.5 MG tablet Take 1  tablet (2.5 mg total) by mouth daily.   FLUoxetine (PROZAC) 40 MG capsule Take 1 capsule (40 mg total) by mouth daily.   [DISCONTINUED] Albuterol-Budesonide (AIRSUPRA) 90-80 MCG/ACT AERO Inhale 1-2 puffs into the lungs every 4 (four) hours as needed. (Patient not taking: Reported on 10/22/2023)   [  DISCONTINUED] azelastine (ASTELIN) 0.1 % nasal spray Place 2 sprays into both nostrils 2 (two) times daily. Use in each nostril as directed (Patient not taking: Reported on 10/22/2023)   [DISCONTINUED] fluticasone (FLONASE) 50 MCG/ACT nasal spray Place 2 sprays into both nostrils in the morning and at bedtime. (Patient not taking: Reported on 10/22/2023)   [DISCONTINUED] fluticasone-salmeterol (ADVAIR HFA) 115-21 MCG/ACT inhaler Inhale 2 puffs into the lungs 2 (two) times daily. (Patient not taking: Reported on 10/22/2023)   [DISCONTINUED] ipratropium (ATROVENT) 0.03 % nasal spray Place 2 sprays into both nostrils every 12 (twelve) hours as needed for rhinitis. (Patient not taking: Reported on 10/22/2023)   No facility-administered encounter medications on file as of 10/22/2023.     Lab Results  Component Value Date   WBC 6.6 10/09/2023   HGB 14.4 10/09/2023   HCT 43.2 10/09/2023   PLT 346.0 10/09/2023   GLUCOSE 91 06/12/2023   CHOL 215 (H) 06/12/2023   TRIG 365.0 (H) 06/12/2023   HDL 36.70 (L) 06/12/2023   LDLDIRECT 110.0 03/07/2023   LDLCALC 105 (H) 06/12/2023   ALT 21 06/12/2023   AST 19 06/12/2023   NA 139 06/12/2023   K 4.1 06/12/2023   CL 106 06/12/2023   CREATININE 0.64 06/12/2023   BUN 14 06/12/2023   CO2 25 06/12/2023   TSH 2.52 06/12/2023    No results found.     Assessment & Plan:  Weight gain Assessment & Plan: She is concerned regarding weight gain.  Discussed.  Discussed diet and exercise.she is interested in GLP 1 agonist. Discussed BMI and history of hypertension. Discussed possible side effects and contraindications. Interested in starting zepbound. Will start 2.5mg   weekly. Follow closely.  Call with update.    Fever, unspecified fever cause -     CBC with Differential/Platelet; Future  Hypercholesterolemia Assessment & Plan: Low cholesterol diet and exercise. Follow lipid panel.   Orders: -     Basic metabolic panel; Future -     Lipid panel; Future  Fatty liver Assessment & Plan: Has seen GI.  Have discussed diet/exercise/weight loss.  Follow liver function tests. Discussed weight loss today. Start zepbound as outlined.   Orders: -     Hepatic function panel; Future  Obsessive-compulsive disorder, unspecified type Assessment & Plan: Continue prozac. Appears to be doing relatively well. Follow.    Hypertension, essential Assessment & Plan: Blood pressure doing well on amlodipine. No changes. Follow pressure.  Follow metabolic panel.    Gastroesophageal reflux disease, unspecified whether esophagitis present Assessment & Plan: Noticed some increased acid reflux - couple of days.  Pepcid if needed. Notify if persistent problem.    Episode of recurrent major depressive disorder, unspecified depression episode severity (HCC) Assessment & Plan: Continue prozac. Stable.    Other orders -     amLODIPine Besylate; Take 1 tablet (2.5 mg total) by mouth daily.  Dispense: 90 tablet; Refill: 2 -     FLUoxetine HCl; Take 1 capsule (40 mg total) by mouth daily.  Dispense: 90 capsule; Refill: 1 -     Tirzepatide-Weight Management; Inject 2.5 mg into the skin once a week.  Dispense: 2 mL; Refill: 2     Dale [place], MD

## 2023-10-22 ENCOUNTER — Ambulatory Visit (INDEPENDENT_AMBULATORY_CARE_PROVIDER_SITE_OTHER): Admitting: Internal Medicine

## 2023-10-22 ENCOUNTER — Other Ambulatory Visit: Payer: Self-pay | Admitting: Internal Medicine

## 2023-10-22 VITALS — BP 122/82 | HR 102 | Temp 98.5°F | Resp 17 | Ht 66.0 in | Wt 176.5 lb

## 2023-10-22 DIAGNOSIS — K219 Gastro-esophageal reflux disease without esophagitis: Secondary | ICD-10-CM

## 2023-10-22 DIAGNOSIS — K760 Fatty (change of) liver, not elsewhere classified: Secondary | ICD-10-CM | POA: Diagnosis not present

## 2023-10-22 DIAGNOSIS — E7800 Pure hypercholesterolemia, unspecified: Secondary | ICD-10-CM | POA: Diagnosis not present

## 2023-10-22 DIAGNOSIS — F429 Obsessive-compulsive disorder, unspecified: Secondary | ICD-10-CM

## 2023-10-22 DIAGNOSIS — I10 Essential (primary) hypertension: Secondary | ICD-10-CM

## 2023-10-22 DIAGNOSIS — R509 Fever, unspecified: Secondary | ICD-10-CM | POA: Diagnosis not present

## 2023-10-22 DIAGNOSIS — R635 Abnormal weight gain: Secondary | ICD-10-CM | POA: Diagnosis not present

## 2023-10-22 DIAGNOSIS — F339 Major depressive disorder, recurrent, unspecified: Secondary | ICD-10-CM

## 2023-10-22 MED ORDER — FLUOXETINE HCL 40 MG PO CAPS: 40.0000 mg | ORAL_CAPSULE | Freq: Every day | ORAL | 1 refills | Status: DC

## 2023-10-22 MED ORDER — AMLODIPINE BESYLATE 2.5 MG PO TABS: 2.5000 mg | ORAL_TABLET | Freq: Every day | ORAL | 2 refills | Status: DC

## 2023-10-22 MED ORDER — TIRZEPATIDE-WEIGHT MANAGEMENT 2.5 MG/0.5ML ~~LOC~~ SOLN: 2.5000 mg | SUBCUTANEOUS | 2 refills | Status: DC

## 2023-10-28 ENCOUNTER — Encounter: Payer: Self-pay | Admitting: Internal Medicine

## 2023-10-28 NOTE — Assessment & Plan Note (Signed)
 Has seen GI.  Have discussed diet/exercise/weight loss.  Follow liver function tests. Discussed weight loss today. Start zepbound as outlined.

## 2023-10-28 NOTE — Assessment & Plan Note (Signed)
 Noticed some increased acid reflux - couple of days.  Pepcid if needed. Notify if persistent problem.

## 2023-10-28 NOTE — Assessment & Plan Note (Signed)
 Low cholesterol diet and exercise.  Follow lipid panel.

## 2023-10-28 NOTE — Assessment & Plan Note (Signed)
Continue prozac.  Stable.  

## 2023-10-28 NOTE — Assessment & Plan Note (Signed)
 Continue prozac. Appears to be doing relatively well. Follow.

## 2023-10-28 NOTE — Assessment & Plan Note (Signed)
 Blood pressure doing well on amlodipine. No changes. Follow pressure.  Follow metabolic panel.

## 2023-10-28 NOTE — Assessment & Plan Note (Signed)
 She is concerned regarding weight gain.  Discussed.  Discussed diet and exercise.she is interested in GLP 1 agonist. Discussed BMI and history of hypertension. Discussed possible side effects and contraindications. Interested in starting zepbound. Will start 2.5mg  weekly. Follow closely.  Call with update.

## 2023-10-30 ENCOUNTER — Telehealth: Payer: Self-pay

## 2023-10-30 ENCOUNTER — Other Ambulatory Visit (HOSPITAL_COMMUNITY): Payer: Self-pay

## 2023-10-30 NOTE — Telephone Encounter (Signed)
 Per test claim, weight loss medications are not covered by plan.

## 2023-10-30 NOTE — Telephone Encounter (Signed)
 Pharmacy Patient Advocate Encounter   Received notification from Onbase that prior authorization for Zepbound 2.5mg /dose injectable is required/requested.   Insurance verification completed.   The patient is insured through [employer] .   Per test claim:

## 2023-11-22 ENCOUNTER — Other Ambulatory Visit (INDEPENDENT_AMBULATORY_CARE_PROVIDER_SITE_OTHER)

## 2023-11-22 ENCOUNTER — Encounter: Payer: Self-pay | Admitting: Internal Medicine

## 2023-11-22 DIAGNOSIS — R509 Fever, unspecified: Secondary | ICD-10-CM | POA: Diagnosis not present

## 2023-11-22 DIAGNOSIS — E7800 Pure hypercholesterolemia, unspecified: Secondary | ICD-10-CM | POA: Diagnosis not present

## 2023-11-22 DIAGNOSIS — K760 Fatty (change of) liver, not elsewhere classified: Secondary | ICD-10-CM | POA: Diagnosis not present

## 2023-11-22 LAB — LIPID PANEL
Cholesterol: 235 mg/dL — ABNORMAL HIGH (ref 0–200)
HDL: 34 mg/dL — ABNORMAL LOW (ref >39.00)
LDL Cholesterol: 160 mg/dL — ABNORMAL HIGH (ref 0–99)
NonHDL: 200.76
Total CHOL/HDL Ratio: 7
Triglycerides: 205 mg/dL — ABNORMAL HIGH (ref 0.0–149.0)
VLDL: 41 mg/dL — ABNORMAL HIGH (ref 0.0–40.0)

## 2023-11-22 LAB — CBC WITH DIFFERENTIAL/PLATELET
Basophils Absolute: 0.1 10*3/uL (ref 0.0–0.1)
Basophils Relative: 0.8 % (ref 0.0–3.0)
Eosinophils Absolute: 0.2 10*3/uL (ref 0.0–0.7)
Eosinophils Relative: 3.7 % (ref 0.0–5.0)
HCT: 43.2 % (ref 36.0–46.0)
Hemoglobin: 14.7 g/dL (ref 12.0–15.0)
Lymphocytes Relative: 36.2 % (ref 12.0–46.0)
Lymphs Abs: 2.3 10*3/uL (ref 0.7–4.0)
MCHC: 34 g/dL (ref 30.0–36.0)
MCV: 88 fl (ref 78.0–100.0)
Monocytes Absolute: 0.4 10*3/uL (ref 0.1–1.0)
Monocytes Relative: 6.6 % (ref 3.0–12.0)
Neutro Abs: 3.4 10*3/uL (ref 1.4–7.7)
Neutrophils Relative %: 52.7 % (ref 43.0–77.0)
Platelets: 338 10*3/uL (ref 150.0–400.0)
RBC: 4.9 Mil/uL (ref 3.87–5.11)
RDW: 13.3 % (ref 11.5–15.5)
WBC: 6.4 10*3/uL (ref 4.0–10.5)

## 2023-11-22 LAB — HEPATIC FUNCTION PANEL
ALT: 29 U/L (ref 0–35)
AST: 25 U/L (ref 0–37)
Albumin: 4.8 g/dL (ref 3.5–5.2)
Alkaline Phosphatase: 53 U/L (ref 39–117)
Bilirubin, Direct: 0.1 mg/dL (ref 0.0–0.3)
Total Bilirubin: 0.6 mg/dL (ref 0.2–1.2)
Total Protein: 7.2 g/dL (ref 6.0–8.3)

## 2023-11-22 LAB — BASIC METABOLIC PANEL WITH GFR
BUN: 17 mg/dL (ref 6–23)
CO2: 26 meq/L (ref 19–32)
Calcium: 9.5 mg/dL (ref 8.4–10.5)
Chloride: 102 meq/L (ref 96–112)
Creatinine, Ser: 0.62 mg/dL (ref 0.40–1.20)
GFR: 114.14 mL/min (ref >60.00)
Glucose, Bld: 94 mg/dL (ref 70–99)
Potassium: 4.3 meq/L (ref 3.5–5.1)
Sodium: 137 meq/L (ref 135–145)

## 2023-11-22 NOTE — Telephone Encounter (Signed)
 Please see her result note. Her triglycerides improved. Her LDL increased.  Recommend continuing diet and exercise. We will continue to follow and if we need to do more in the future - we will.  If further questions or concerns, can schedule appt (virtual or in person) to discuss.

## 2023-12-24 ENCOUNTER — Ambulatory Visit (INDEPENDENT_AMBULATORY_CARE_PROVIDER_SITE_OTHER): Admitting: Internal Medicine

## 2023-12-24 ENCOUNTER — Encounter: Payer: Self-pay | Admitting: Internal Medicine

## 2023-12-24 VITALS — BP 120/70 | HR 85 | Temp 98.0°F | Resp 16 | Ht 66.0 in | Wt 163.6 lb

## 2023-12-24 DIAGNOSIS — E7800 Pure hypercholesterolemia, unspecified: Secondary | ICD-10-CM

## 2023-12-24 DIAGNOSIS — F419 Anxiety disorder, unspecified: Secondary | ICD-10-CM

## 2023-12-24 DIAGNOSIS — F339 Major depressive disorder, recurrent, unspecified: Secondary | ICD-10-CM | POA: Diagnosis not present

## 2023-12-24 DIAGNOSIS — K760 Fatty (change of) liver, not elsewhere classified: Secondary | ICD-10-CM | POA: Diagnosis not present

## 2023-12-24 DIAGNOSIS — I10 Essential (primary) hypertension: Secondary | ICD-10-CM

## 2023-12-24 NOTE — Assessment & Plan Note (Addendum)
 Low cholesterol diet and exercise. Follow lipid panel. Discussed recent labs.

## 2023-12-24 NOTE — Progress Notes (Signed)
 Subjective:    Patient ID: Becky Mccoy, female    DOB: 1986-04-15, 38 y.o.   MRN: [id number]  Patient here for  Chief Complaint  Patient presents with   Medical Management of Chronic Issues    HPI Here for a scheduled follow up - follow up regarding hypercholesterolemia, weight gain, hypertension and OCD.  Appears to be doing relatively well. Tries to stay active. No chest pain or sob reported. No abdominal pain reported. Appears to be handling stress. Discussed recent labs.    Past Medical History:  Diagnosis Date   COVID-19    04/2021 11/202   History of chicken pox    Hx: UTI (urinary tract infection)    Obsessive-compulsive and related disorder    OCD (obsessive compulsive disorder)    Past Surgical History:  Procedure Laterality Date   WISDOM TOOTH EXTRACTION     Family History  Problem Relation Age of Onset   Healthy Mother    Healthy Father    Cancer - Other Maternal Grandfather        stomach cancer   Cancer - Other Paternal Grandfather        Bone cancer   Social History   Socioeconomic History   Marital status: Married    Spouse name: Not on file   Number of children: Not on file   Years of education: Not on file   Highest education level: Some college, no degree  Occupational History   Not on file  Tobacco Use   Smoking status: Never   Smokeless tobacco: Never  Vaping Use   Vaping status: Never Used  Substance and Sexual Activity   Alcohol use: No    Alcohol/week: 0.0 standard drinks of alcohol   Drug use: No   Sexual activity: Yes    Birth control/protection: None    Comment: Vasectomy  Other Topics Concern   Not on file  Social History Narrative   Not on file   Social Drivers of Health   Financial Resource Strain: Low Risk  (10/09/2023)   Overall Financial Resource Strain (CARDIA)    Difficulty of Paying Living Expenses: Not very hard  Food Insecurity: No Food Insecurity (10/09/2023)   Hunger Vital Sign    Worried About Running  Out of Food in the Last Year: Never true    Ran Out of Food in the Last Year: Never true  Transportation Needs: No Transportation Needs (10/09/2023)   PRAPARE - Administrator, Civil Service (Medical): No    Lack of Transportation (Non-Medical): No  Physical Activity: Sufficiently Active (10/09/2023)   Exercise Vital Sign    Days of Exercise per Week: 3 days    Minutes of Exercise per Session: 50 min  Stress: No Stress Concern Present (10/09/2023)   [employer] of Occupational Health - Occupational Stress Questionnaire    Feeling of Stress : Not at all  Social Connections: Socially Integrated (10/09/2023)   Social Connection and Isolation Panel [NHANES]    Frequency of Communication with Friends and Family: More than three times a week    Frequency of Social Gatherings with Friends and Family: Three times a week    Attends Religious Services: More than 4 times per year    Active Member of Clubs or Organizations: Yes    Attends Banker Meetings: More than 4 times per year    Marital Status: Married     Review of Systems  Constitutional:  Negative for appetite change and  unexpected weight change.  HENT:  Negative for congestion and sinus pressure.   Respiratory:  Negative for cough, chest tightness and shortness of breath.   Cardiovascular:  Negative for chest pain, palpitations and leg swelling.  Gastrointestinal:  Negative for abdominal pain, diarrhea, nausea and vomiting.  Genitourinary:  Negative for difficulty urinating and dysuria.  Musculoskeletal:  Negative for joint swelling and myalgias.  Skin:  Negative for color change and rash.  Neurological:  Negative for dizziness and headaches.  Psychiatric/Behavioral:  Negative for agitation and dysphoric mood.        Objective:     BP 120/70   Pulse 85   Temp 98 F (36.7 C)   Resp 16   Ht 5\' 6"  (1.676 m)   Wt 163 lb 9.6 oz (74.2 kg)   SpO2 98%   BMI 26.41 kg/m  Wt Readings from Last 3  Encounters:  12/24/23 163 lb 9.6 oz (74.2 kg)  10/22/23 176 lb 8 oz (80.1 kg)  10/09/23 172 lb 2 oz (78.1 kg)    Physical Exam Vitals reviewed.  Constitutional:      General: She is not in acute distress.    Appearance: Normal appearance.  HENT:     Head: Normocephalic and atraumatic.     Right Ear: External ear normal.     Left Ear: External ear normal.     Mouth/Throat:     Pharynx: No oropharyngeal exudate or posterior oropharyngeal erythema.  Eyes:     General: No scleral icterus.       Right eye: No discharge.        Left eye: No discharge.     Conjunctiva/sclera: Conjunctivae normal.  Neck:     Thyroid : No thyromegaly.  Cardiovascular:     Rate and Rhythm: Normal rate and regular rhythm.  Pulmonary:     Effort: No respiratory distress.     Breath sounds: Normal breath sounds. No wheezing.  Abdominal:     General: Bowel sounds are normal.     Palpations: Abdomen is soft.     Tenderness: There is no abdominal tenderness.  Musculoskeletal:        General: No swelling or tenderness.     Cervical back: Neck supple. No tenderness.  Lymphadenopathy:     Cervical: No cervical adenopathy.  Skin:    Findings: No erythema or rash.  Neurological:     Mental Status: She is alert.  Psychiatric:        Mood and Affect: Mood normal.        Behavior: Behavior normal.         Outpatient Encounter Medications as of 12/24/2023  Medication Sig   amLODipine  (NORVASC ) 2.5 MG tablet Take 1 tablet (2.5 mg total) by mouth daily.   FLUoxetine  (PROZAC ) 40 MG capsule Take 1 capsule (40 mg total) by mouth daily.   [DISCONTINUED] tirzepatide (ZEPBOUND) 2.5 MG/0.5ML injection vial Inject 2.5 mg into the skin once a week.   No facility-administered encounter medications on file as of 12/24/2023.     Lab Results  Component Value Date   WBC 6.4 11/22/2023   HGB 14.7 11/22/2023   HCT 43.2 11/22/2023   PLT 338.0 11/22/2023   GLUCOSE 94 11/22/2023   CHOL 235 (H) 11/22/2023   TRIG  205.0 (H) 11/22/2023   HDL 34.00 (L) 11/22/2023   LDLDIRECT 110.0 03/07/2023   LDLCALC 160 (H) 11/22/2023   ALT 29 11/22/2023   AST 25 11/22/2023   NA 137 11/22/2023  K 4.3 11/22/2023   CL 102 11/22/2023   CREATININE 0.62 11/22/2023   BUN 17 11/22/2023   CO2 26 11/22/2023   TSH 2.52 06/12/2023        Assessment & Plan:  Hypercholesterolemia Assessment & Plan: Low cholesterol diet and exercise. Follow lipid panel. Discussed recent labs.     Anxiety Assessment & Plan: Continues on prozac . Overall appears to be doing relatively well. No changes in medication. Follow.    Episode of recurrent major depressive disorder, unspecified depression episode severity (HCC) Assessment & Plan: Continues on prozac . Stable. Follow.    Fatty liver Assessment & Plan: Has seen GI.  Have discussed diet/exercise/weight loss.  Follow liver function tests. Recent check 11/22/23 - wnl.    Hypertension, essential Assessment & Plan: Blood pressure doing well on amlodipine . No changes in medication. Follow pressures.       Dellar Fenton, MD

## 2023-12-31 ENCOUNTER — Encounter: Payer: Self-pay | Admitting: Internal Medicine

## 2023-12-31 NOTE — Assessment & Plan Note (Signed)
 Blood pressure doing well on amlodipine . No changes in medication. Follow pressures.

## 2023-12-31 NOTE — Assessment & Plan Note (Signed)
 Continues on prozac . Overall appears to be doing relatively well. No changes in medication. Follow.

## 2023-12-31 NOTE — Assessment & Plan Note (Signed)
 Has seen GI.  Have discussed diet/exercise/weight loss.  Follow liver function tests. Recent check 11/22/23 - wnl.

## 2023-12-31 NOTE — Assessment & Plan Note (Signed)
 Continues on prozac . Stable. Follow.

## 2024-01-01 DIAGNOSIS — N83201 Unspecified ovarian cyst, right side: Secondary | ICD-10-CM | POA: Diagnosis not present

## 2024-01-01 DIAGNOSIS — R1031 Right lower quadrant pain: Secondary | ICD-10-CM | POA: Diagnosis not present

## 2024-01-01 DIAGNOSIS — R10813 Right lower quadrant abdominal tenderness: Secondary | ICD-10-CM | POA: Diagnosis not present

## 2024-01-01 DIAGNOSIS — N920 Excessive and frequent menstruation with regular cycle: Secondary | ICD-10-CM | POA: Diagnosis not present

## 2024-01-01 DIAGNOSIS — Z1152 Encounter for screening for COVID-19: Secondary | ICD-10-CM | POA: Diagnosis not present

## 2024-01-01 DIAGNOSIS — N83202 Unspecified ovarian cyst, left side: Secondary | ICD-10-CM | POA: Diagnosis not present

## 2024-01-01 DIAGNOSIS — R509 Fever, unspecified: Secondary | ICD-10-CM | POA: Diagnosis not present

## 2024-01-01 DIAGNOSIS — I10 Essential (primary) hypertension: Secondary | ICD-10-CM | POA: Diagnosis not present

## 2024-01-01 DIAGNOSIS — Z882 Allergy status to sulfonamides status: Secondary | ICD-10-CM | POA: Diagnosis not present

## 2024-01-01 DIAGNOSIS — R935 Abnormal findings on diagnostic imaging of other abdominal regions, including retroperitoneum: Secondary | ICD-10-CM | POA: Diagnosis not present

## 2024-01-01 DIAGNOSIS — F419 Anxiety disorder, unspecified: Secondary | ICD-10-CM | POA: Diagnosis not present

## 2024-01-02 DIAGNOSIS — N83201 Unspecified ovarian cyst, right side: Secondary | ICD-10-CM | POA: Diagnosis not present

## 2024-01-02 DIAGNOSIS — R10813 Right lower quadrant abdominal tenderness: Secondary | ICD-10-CM | POA: Diagnosis not present

## 2024-01-02 DIAGNOSIS — N83202 Unspecified ovarian cyst, left side: Secondary | ICD-10-CM | POA: Diagnosis not present

## 2024-01-02 DIAGNOSIS — R935 Abnormal findings on diagnostic imaging of other abdominal regions, including retroperitoneum: Secondary | ICD-10-CM | POA: Diagnosis not present

## 2024-01-02 DIAGNOSIS — R509 Fever, unspecified: Secondary | ICD-10-CM | POA: Diagnosis not present

## 2024-03-07 ENCOUNTER — Telehealth: Payer: Self-pay | Admitting: Internal Medicine

## 2024-03-13 ENCOUNTER — Other Ambulatory Visit

## 2024-03-13 ENCOUNTER — Other Ambulatory Visit: Payer: Self-pay

## 2024-03-13 DIAGNOSIS — K760 Fatty (change of) liver, not elsewhere classified: Secondary | ICD-10-CM

## 2024-03-13 DIAGNOSIS — E7800 Pure hypercholesterolemia, unspecified: Secondary | ICD-10-CM

## 2024-03-18 ENCOUNTER — Encounter: Admitting: Internal Medicine

## 2024-03-24 NOTE — Telephone Encounter (Signed)
 [name]

## 2024-03-25 ENCOUNTER — Encounter: Payer: Self-pay | Admitting: Emergency Medicine

## 2024-03-25 ENCOUNTER — Ambulatory Visit
Admission: EM | Admit: 2024-03-25 | Discharge: 2024-03-25 | Disposition: A | Discharge status: Home / Self Care | Attending: Emergency Medicine | Admitting: Emergency Medicine

## 2024-03-25 DIAGNOSIS — M542 Cervicalgia: Secondary | ICD-10-CM

## 2024-03-25 MED ORDER — CELECOXIB 100 MG PO CAPS: 100.0000 mg | ORAL_CAPSULE | Freq: Every day | ORAL | 0 refills | Status: DC

## 2024-03-25 MED ORDER — METHOCARBAMOL 500 MG PO TABS: 500.0000 mg | ORAL_TABLET | Freq: Three times a day (TID) | ORAL | 0 refills | Status: AC

## 2024-03-25 MED ORDER — KETOROLAC TROMETHAMINE 30 MG/ML IJ SOLN
30.0000 mg | Freq: Once | INTRAMUSCULAR | Status: AC
  Administered 2024-03-25 (×2): 30 mg via INTRAMUSCULAR

## 2024-03-25 NOTE — Discharge Instructions (Addendum)
 Your pain is most likely caused by irritation to the muscles .  You have been given an injection of Toradol  to reduce inflammation and pain and ideally will start to see some improvement within the neck  Start tomorrow take Celebrex  every morning for the remainder of the week then you may use as needed, this helps to reduce inflammation and help with pain, similar to ibuprofen  but a little stronger  You may use Robaxin  every 8 hours as needed, be mindful this can make you feel sleepy  You may use heating pad in 15 minute intervals as needed for additional comfort  Begin stretching affected area daily for 10 minutes as tolerated to further loosen muscles   When lying down place pillow underneath and between knees for support  Can try sleeping without pillow on firm mattress   Practice good posture: head back, shoulders back, chest forward, pelvis back and weight distributed evenly on both legs  If pain persist after recommended treatment or reoccurs if may be beneficial to follow up with orthopedic specialist for evaluation, this doctor specializes in the bones and can manage your symptoms long-term with options such as but not limited to imaging, medications or physical therapy

## 2024-03-25 NOTE — ED Provider Notes (Signed)
 [facility]    CSN: [id number] Arrival date & time: 03/25/24  1115      History   Chief Complaint Chief Complaint  Patient presents with   Torticollis    HPI Becky Mccoy is a 38 y.o. female.   Patient presents for evaluation of posterior neck pain radiating down to the weeks.  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  Pain has been constant exacerbated with turning of the head and looking downward and has felt no relief.  Denies numbness or tingling.  Past Medical History:  Diagnosis Date   COVID-19    04/2021 11/202   History of chicken pox    Hx: UTI (urinary tract infection)    Obsessive-compulsive and related disorder    OCD (obsessive compulsive disorder)     Patient Active Problem List   Diagnosis Date Noted   Reactive airway disease 10/14/2023   Allergic rhinitis 10/14/2023   Nasal congestion 10/14/2023   Fever 10/14/2023   Cough in adult 10/14/2023   Anxiety 07/08/2022   Back pain 05/24/2022   Acid reflux 04/02/2022   Fatty liver 04/02/2022   Eosinophilia 04/02/2022   Headache 02/27/2022   Hypertension, essential 02/27/2022   Colitis 12/12/2021   Depression, major, recurrent (HCC) 04/18/2021   Weight gain 04/18/2021   Breast feeding status of mother 10/27/2020   Vaginal discharge 10/17/2020   Leg skin lesion, right 10/17/2020   Premature rupture of membranes (PROM) affecting fourth pregnancy 08/29/2019   Normal vaginal delivery 08/29/2019   Postpartum care following vaginal delivery 08/29/2019   Encounter for supervision of low-risk pregnancy, antepartum 01/09/2019   Hypercholesterolemia 06/18/2018   Nipple discharge 12/11/2016   Chest pain 12/11/2016   Health care maintenance 02/25/2015   Thyroid  fullness 06/07/2014   OCD (obsessive compulsive disorder) 06/07/2014    Past Surgical History:  Procedure Laterality Date   WISDOM  TOOTH EXTRACTION      OB History     Gravida  4   Para  3   Term  3   Preterm      AB  1   Living  3      SAB  1   IAB      Ectopic      Multiple  0   Live Births  1            Home Medications    Prior to Admission medications   Medication Sig Start Date End Date Taking? Authorizing Provider  celecoxib  (CELEBREX ) 100 MG capsule Take 1 capsule (100 mg total) by mouth daily. 03/25/24  Yes Robben Jagiello R, NP  methocarbamol  (ROBAXIN ) 500 MG tablet Take 1 tablet (500 mg total) by mouth 3 (three) times daily for 7 days. 03/25/24 04/01/24 Yes Latori Beggs R, NP  amLODipine  (NORVASC ) 2.5 MG tablet Take 1 tablet (2.5 mg total) by mouth daily. 10/22/23   Glendia Shad, MD  FLUoxetine  (PROZAC ) 40 MG capsule Take 1 capsule (40 mg total) by mouth daily. 10/22/23   Glendia Shad, MD    Family History Family History  Problem Relation Age of Onset   Healthy Mother    Healthy Father    Cancer - Other Maternal Grandfather        stomach cancer   Cancer - Other Paternal Grandfather        Bone cancer    Social History Social History  Tobacco Use   Smoking status: Never   Smokeless tobacco: Never  Vaping Use   Vaping status: Never Used  Substance Use Topics   Alcohol use: No    Alcohol/week: 0.0 standard drinks of alcohol   Drug use: No     Allergies   Sulfa antibiotics   Review of Systems Review of Systems   Physical Exam Triage Vital Signs ED Triage Vitals  Encounter Vitals Group     BP 03/25/24 1138 122/80     Girls Systolic BP Percentile --      Girls Diastolic BP Percentile --      Boys Systolic BP Percentile --      Boys Diastolic BP Percentile --      Pulse Rate 03/25/24 1138 90     Resp 03/25/24 1138 18     Temp 03/25/24 1138 99.3 F (37.4 C)     Temp Source 03/25/24 1138 Oral     SpO2 03/25/24 1138 99 %     Weight --      Height --      Head Circumference --      Peak Flow --      Pain Score 03/25/24 1143 7     Pain Loc --       Pain Education --      Exclude from Growth Chart --    No data found.  Updated Vital Signs BP 122/80 (BP Location: Right Arm)   Pulse 90   Temp 99.3 F (37.4 C) (Oral)   Resp 18   LMP 03/06/2024 (Exact Date)   SpO2 99%   Visual Acuity Right Eye Distance:   Left Eye Distance:   Bilateral Distance:    Right Eye Near:   Left Eye Near:    Bilateral Near:     Physical Exam Constitutional:      Appearance: Normal appearance.  Eyes:     Extraocular Movements: Extraocular movements intact.  Neck:     Comments: Tenderness generalized to the posterior of the neck, able to complete range of motion, pain elicited with lateral turn and rotation, 2+ carotid pulses bilaterally, strength 5 out of 5 Pulmonary:     Effort: Pulmonary effort is normal.  Neurological:     Mental Status: She is alert and oriented to person, place, and time. Mental status is at baseline.      UC Treatments / Results  Labs (all labs ordered are listed, but only abnormal results are displayed) Labs Reviewed - No data to display  EKG   Radiology No results found.  Procedures Procedures (including critical care time)  Medications Ordered in UC Medications  ketorolac  (TORADOL ) 30 MG/ML injection 30 mg (has no administration in time range)    Initial Impression / Assessment and Plan / UC Course  I have reviewed the triage vital signs and the nursing notes.  Pertinent labs & imaging results that were available during my care of the patient were reviewed by me and considered in my medical decision making (see chart for details).  Acute neck pain  Etiology muscular low suspicion for spinal involvement, denies injury, deferred, Toradol  IM given, deferring use of prednisone  as she endorses this makes her feel bad, prescribed Celebrex  and Robaxin  recommended for her to care through [facility], given written handout of stretches and exercises given walking referral to ortho Final Clinical Impressions(s)  / UC Diagnoses   Final diagnoses:  Acute neck pain     Discharge Instructions  Your pain is most likely caused by irritation to the muscles .  You have been given an injection of Toradol  to reduce inflammation and pain and ideally will start to see some improvement within the neck  Start tomorrow take Celebrex  every morning for the remainder of the week then you may use as needed, this helps to reduce inflammation and help with pain, similar to ibuprofen  but a little stronger  You may use Robaxin  every 8 hours as needed, be mindful this can make you feel sleepy  You may use heating pad in 15 minute intervals as needed for additional comfort  Begin stretching affected area daily for 10 minutes as tolerated to further loosen muscles   When lying down place pillow underneath and between knees for support  Can try sleeping without pillow on firm mattress   Practice good posture: head back, shoulders back, chest forward, pelvis back and weight distributed evenly on both legs  If pain persist after recommended treatment or reoccurs if may be beneficial to follow up with orthopedic specialist for evaluation, this doctor specializes in the bones and can manage your symptoms long-term with options such as but not limited to imaging, medications or physical therapy      ED Prescriptions     Medication Sig Dispense Auth. Provider   methocarbamol  (ROBAXIN ) 500 MG tablet Take 1 tablet (500 mg total) by mouth 3 (three) times daily for 7 days. 21 tablet Nirav Sweda R, NP   celecoxib  (CELEBREX ) 100 MG capsule Take 1 capsule (100 mg total) by mouth daily. 10 capsule Mariyam Remington R, NP      PDMP not reviewed this encounter.   Teresa Shelba SAUNDERS, NP 03/25/24 1212

## 2024-03-25 NOTE — ED Triage Notes (Signed)
 Patient reports left sided and right sided neck pain x 2 weeks. Patent denies injury. Patient has tried used Ibuprofen , Tylenol , and Lidocaine  patches and epsom salt. Patient also had it massaged with no relief. Rates pain 7/10. Patient has taken anything for pain today.

## 2024-04-11 ENCOUNTER — Encounter: Payer: Self-pay | Admitting: Internal Medicine

## 2024-04-11 ENCOUNTER — Other Ambulatory Visit: Payer: Self-pay

## 2024-04-11 ENCOUNTER — Ambulatory Visit (INDEPENDENT_AMBULATORY_CARE_PROVIDER_SITE_OTHER): Admitting: Internal Medicine

## 2024-04-11 VITALS — BP 112/78 | HR 98 | Temp 98.6°F | Resp 16 | Ht 66.0 in | Wt 161.3 lb

## 2024-04-11 DIAGNOSIS — M62838 Other muscle spasm: Secondary | ICD-10-CM

## 2024-04-11 MED ORDER — NAPROXEN 500 MG PO TABS: 500.0000 mg | ORAL_TABLET | Freq: Two times a day (BID) | ORAL | 0 refills | Status: AC

## 2024-04-11 MED ORDER — TIZANIDINE HCL 4 MG PO TABS
4.0000 mg | ORAL_TABLET | Freq: Every evening | ORAL | 0 refills | Status: DC | PRN
Start: 2024-04-11 — End: 2024-06-17

## 2024-04-11 NOTE — Progress Notes (Signed)
 Acute Office Visit  Subjective:     Patient ID: Becky Mccoy, female    DOB: [birth year], 38 y.o.   MRN: [id number]  Chief Complaint  Patient presents with   Back Pain    Upper back pain for 5 weeks. Seen at UC    Back Pain   Patient is in today for upper back pain. She is a Dr. Glendia patient and this is my first time meeting her.   Discussed the use of AI scribe software for clinical note transcription with the patient, who gave verbal consent to proceed.  History of Present Illness Becky Mccoy is a 38 year old female who presents with persistent upper back pain.  She experiences deep upper back pain in the upper trapezius area for four to five weeks. The pain is persistent and unrelieved by Celebrex , Rituxan, lidocaine  patches, and a Toradol  shot. 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  She has used topical treatments like Deep Blue and Cabava from [employer] without relief. Prednisone  and meloxicam  have also been ineffective.  She experiences numbness in her hands when lying on her back and holding her phone, likely due to positioning. No muscle spasms are present. No imaging studies have been conducted for her current condition.    Review of Systems  Musculoskeletal:  Positive for back pain.        Objective:    BP 112/78 (Cuff Size: Normal)   Pulse 98   Temp 98.6 F (37 C) (Oral)   Resp 16   Ht 5' 6 (1.676 m)   Wt 161 lb 4.8 oz (73.2 kg)   LMP 03/06/2024 (Exact Date)   SpO2 99%   BMI 26.03 kg/m    Physical Exam Constitutional:      Appearance: Normal appearance.  HENT:     Head: Normocephalic and atraumatic.  Eyes:     Conjunctiva/sclera: Conjunctivae normal.  Neck:     Comments: Decreased range of motion with extension and left rotation Cardiovascular:     Rate and Rhythm: Normal rate and regular rhythm.  Pulmonary:     Effort: Pulmonary effort is normal.     Breath sounds: Normal  breath sounds.  Musculoskeletal:     Cervical back: Tenderness present.     Thoracic back: Spasms and tenderness present.  Skin:    General: Skin is warm and dry.  Neurological:     General: No focal deficit present.     Mental Status: She is alert. Mental status is at baseline.  Psychiatric:        Mood and Affect: Mood normal.        Behavior: Behavior normal.     No results found for any visits on 04/11/24.      Assessment & Plan:   Assessment & Plan Upper back and neck muscle spasm Chronic pain likely muscular due to occupational activities. Severe, persistent, unresponsive to previous treatments. Differential includes muscular spasm vs. disc involvement. No significant neurological symptoms. Imaging not performed. Open to new treatments and aware of potential need for further interventions. - Prescribe naproxen  500 mg twice daily for 10 days with food. - Prescribe tizanidine  at bedtime. - Provide printed exercises and stretches. - Consider trigger point injection with lidocaine  and steroids if no improvement. - Recommend heat application and deep tissue massage. - Order x-ray if symptoms persist. - Advise careful lifting techniques at work. - Discuss potential future interventions such as  physical therapy or chiropractic care if symptoms persist.  Chronic low back pain Intermittent chronic pain, previously evaluated and treated. Current focus on more severe upper back and neck pain. - Consider physical therapy for strengthening and prevention of reinjury if upper back and neck pain management is successful.  - naproxen  (NAPROSYN ) 500 MG tablet; Take 1 tablet (500 mg total) by mouth 2 (two) times daily with a meal for 10 days.  Dispense: 20 tablet; Refill: 0 - tiZANidine  (ZANAFLEX ) 4 MG tablet; Take 1 tablet (4 mg total) by mouth at bedtime as needed.  Dispense: 30 tablet; Refill: 0   Return if symptoms worsen or fail to improve.  Sharyle Fischer, DO

## 2024-04-11 NOTE — Patient Instructions (Signed)
 Neck Exercises Ask your health care provider which exercises are safe for you. Do exercises exactly as told by your health care provider and adjust them as directed. It is normal to feel mild stretching, pulling, tightness, or discomfort as you do these exercises. Stop right away if you feel sudden pain or your pain gets worse. Do not begin these exercises until told by your health care provider. Neck exercises can be important for many reasons. They can improve strength and maintain flexibility in your neck, which will help your upper back and prevent neck pain. Stretching exercises Rotation neck stretching  Sit in a chair or stand up. Place your feet flat on the floor, shoulder-width apart. Slowly turn your head (rotate) to the right until a slight stretch is felt. Turn it all the way to the right so you can look over your right shoulder. Do not tilt or tip your head. Hold this position for 10-30 seconds. Slowly turn your head (rotate) to the left until a slight stretch is felt. Turn it all the way to the left so you can look over your left shoulder. Do not tilt or tip your head. Hold this position for 10-30 seconds. Repeat __________ times. Complete this exercise __________ times a day. Neck retraction  Sit in a sturdy chair or stand up. Look straight ahead. Do not bend your neck. Use your fingers to push your chin backward (retraction). Do not bend your neck for this movement. Continue to face straight ahead. If you are doing the exercise properly, you will feel a slight sensation in your throat and a stretch at the back of your neck. Hold the stretch for 1-2 seconds. Repeat __________ times. Complete this exercise __________ times a day. Strengthening exercises Neck press  Lie on your back on a firm bed or on the floor with a pillow under your head. Use your neck muscles to push your head down on the pillow and straighten your spine. Hold the position as well as you can. Keep your head  facing up (in a neutral position) and your chin tucked. Slowly count to 5 while holding this position. Repeat __________ times. Complete this exercise __________ times a day. Isometrics These are exercises in which you strengthen the muscles in your neck while keeping your neck still (isometrics). Sit in a supportive chair and place your hand on your forehead. Keep your head and face facing straight ahead. Do not flex or extend your neck while doing isometrics. Push forward with your head and neck while pushing back with your hand. Hold for 10 seconds. Do the sequence again, this time putting your hand against the back of your head. Use your head and neck to push backward against the hand pressure. Finally, do the same exercise on either side of your head, pushing sideways against the pressure of your hand. Repeat __________ times. Complete this exercise __________ times a day. Prone head lifts  Lie face-down (prone position), resting on your elbows so that your chest and upper back are raised. Start with your head facing downward, near your chest. Position your chin either on or near your chest. Slowly lift your head upward. Lift until you are looking straight ahead. Then continue lifting your head as far back as you can comfortably stretch. Hold your head up for 5 seconds. Then slowly lower it to your starting position. Repeat __________ times. Complete this exercise __________ times a day. Supine head lifts  Lie on your back (supine position), bending your knees  to point to the ceiling and keeping your feet flat on the floor. Lift your head slowly off the floor, raising your chin toward your chest. Hold for 5 seconds. Repeat __________ times. Complete this exercise __________ times a day. Scapular retraction  Stand with your arms at your sides. Look straight ahead. Slowly pull both shoulders (scapulae) backward and downward (retraction) until you feel a stretch between your shoulder  blades in your upper back. Hold for 10-30 seconds. Relax and repeat. Repeat __________ times. Complete this exercise __________ times a day. Contact a health care provider if: Your neck pain or discomfort gets worse when you do an exercise. Your neck pain or discomfort does not improve within 2 hours after you exercise. If you have any of these problems, stop exercising right away. Do not do the exercises again unless your health care provider says that you can. Get help right away if: You develop sudden, severe neck pain. If this happens, stop exercising right away. Do not do the exercises again unless your health care provider says that you can. This information is not intended to replace advice given to you by your health care provider. Make sure you discuss any questions you have with your health care provider. Document Revised: 01/25/2021 Document Reviewed: 01/25/2021 Elsevier Patient Education  2024 [employer].

## 2024-06-12 ENCOUNTER — Other Ambulatory Visit

## 2024-06-12 DIAGNOSIS — E7800 Pure hypercholesterolemia, unspecified: Secondary | ICD-10-CM

## 2024-06-12 DIAGNOSIS — K760 Fatty (change of) liver, not elsewhere classified: Secondary | ICD-10-CM

## 2024-06-12 LAB — HEPATIC FUNCTION PANEL
ALT: 17 U/L (ref 0–35)
AST: 16 U/L (ref 0–37)
Albumin: 4.5 g/dL (ref 3.5–5.2)
Alkaline Phosphatase: 73 U/L (ref 39–117)
Bilirubin, Direct: 0.1 mg/dL (ref 0.0–0.3)
Total Bilirubin: 0.6 mg/dL (ref 0.2–1.2)
Total Protein: 7.5 g/dL (ref 6.0–8.3)

## 2024-06-12 LAB — LIPID PANEL
Cholesterol: 251 mg/dL — ABNORMAL HIGH (ref 0–200)
HDL: 38.2 mg/dL — ABNORMAL LOW (ref >39.00)
NonHDL: 213.01
Total CHOL/HDL Ratio: 7
Triglycerides: 524 mg/dL — ABNORMAL HIGH (ref 0.0–149.0)
VLDL: 104.8 mg/dL — ABNORMAL HIGH (ref 0.0–40.0)

## 2024-06-12 LAB — BASIC METABOLIC PANEL WITH GFR
BUN: 16 mg/dL (ref 6–23)
CO2: 27 meq/L (ref 19–32)
Calcium: 9.5 mg/dL (ref 8.4–10.5)
Chloride: 103 meq/L (ref 96–112)
Creatinine, Ser: 0.61 mg/dL (ref 0.40–1.20)
GFR: 114.14 mL/min (ref >60.00)
Glucose, Bld: 84 mg/dL (ref 70–99)
Potassium: 4.2 meq/L (ref 3.5–5.1)
Sodium: 138 meq/L (ref 135–145)

## 2024-06-12 LAB — LDL CHOLESTEROL, DIRECT: Direct LDL: 114 mg/dL

## 2024-06-12 LAB — TSH: TSH: 1.68 u[IU]/mL (ref 0.35–5.50)

## 2024-06-13 ENCOUNTER — Other Ambulatory Visit

## 2024-06-16 ENCOUNTER — Ambulatory Visit: Payer: Self-pay | Admitting: Internal Medicine

## 2024-06-17 ENCOUNTER — Ambulatory Visit: Admitting: Internal Medicine

## 2024-06-17 ENCOUNTER — Other Ambulatory Visit (HOSPITAL_COMMUNITY)
Admission: RE | Admit: 2024-06-17 | Discharge: 2024-06-17 | Disposition: A | Discharge status: Home / Self Care | Source: Ambulatory Visit | Attending: Internal Medicine | Admitting: Internal Medicine

## 2024-06-17 ENCOUNTER — Encounter: Payer: Self-pay | Admitting: Internal Medicine

## 2024-06-17 ENCOUNTER — Ambulatory Visit
Admission: RE | Admit: 2024-06-17 | Discharge: 2024-06-17 | Disposition: A | Source: Ambulatory Visit | Attending: Internal Medicine | Admitting: Internal Medicine

## 2024-06-17 ENCOUNTER — Other Ambulatory Visit (HOSPITAL_COMMUNITY)

## 2024-06-17 VITALS — BP 110/70 | HR 81 | Temp 98.2°F | Ht 65.5 in | Wt 172.5 lb

## 2024-06-17 DIAGNOSIS — M545 Low back pain, unspecified: Secondary | ICD-10-CM

## 2024-06-17 DIAGNOSIS — F339 Major depressive disorder, recurrent, unspecified: Secondary | ICD-10-CM

## 2024-06-17 DIAGNOSIS — Z0000 Encounter for general adult medical examination without abnormal findings: Secondary | ICD-10-CM | POA: Diagnosis not present

## 2024-06-17 DIAGNOSIS — M62838 Other muscle spasm: Secondary | ICD-10-CM

## 2024-06-17 DIAGNOSIS — E7800 Pure hypercholesterolemia, unspecified: Secondary | ICD-10-CM | POA: Diagnosis not present

## 2024-06-17 DIAGNOSIS — K760 Fatty (change of) liver, not elsewhere classified: Secondary | ICD-10-CM

## 2024-06-17 DIAGNOSIS — F429 Obsessive-compulsive disorder, unspecified: Secondary | ICD-10-CM

## 2024-06-17 DIAGNOSIS — F419 Anxiety disorder, unspecified: Secondary | ICD-10-CM

## 2024-06-17 DIAGNOSIS — Z124 Encounter for screening for malignant neoplasm of cervix: Secondary | ICD-10-CM | POA: Diagnosis present

## 2024-06-17 DIAGNOSIS — I10 Essential (primary) hypertension: Secondary | ICD-10-CM

## 2024-06-17 MED ORDER — AMLODIPINE BESYLATE 2.5 MG PO TABS: 2.5000 mg | ORAL_TABLET | Freq: Every day | ORAL | 2 refills | Status: AC

## 2024-06-17 MED ORDER — FLUOXETINE HCL 40 MG PO CAPS: 40.0000 mg | ORAL_CAPSULE | Freq: Every day | ORAL | 1 refills | Status: AC

## 2024-06-17 MED ORDER — TIZANIDINE HCL 4 MG PO TABS: 4.0000 mg | ORAL_TABLET | Freq: Every evening | ORAL | 0 refills | Status: AC | PRN

## 2024-06-17 NOTE — Assessment & Plan Note (Signed)
 Physical today 06/17/24.  PAP 10/12/20 - negative with negative HPV. Repeat pap today. Mammogram 05/15/23 - birads 0. F/u right breast mammogram and ultrasound - Birads II. Recommended annual screening mammogram.

## 2024-06-17 NOTE — Progress Notes (Signed)
 Subjective:    Patient ID: Becky Mccoy, female    DOB: June 13, 1986, 38 y.o.   MRN: [id number]  Patient here for  Chief Complaint  Patient presents with   Annual Exam    CPE & review labs    HPI Here for a physical exam. Recently evaluated - upper back and neck pain - last seen 04/11/24 - prescribed naproxen  and tizanidine , heat application and deep tissue massage. Exercises. Reports upper back is better. Noticed some persistent low back pain. No radicular symptoms. Breathing stable. No increased cough or congestion. No abdominal pain or bowel change reported. Handling stress.    Past Medical History:  Diagnosis Date   COVID-19    04/2021 11/202   History of chicken pox    Hx: UTI (urinary tract infection)    Obsessive-compulsive and related disorder    OCD (obsessive compulsive disorder)    Past Surgical History:  Procedure Laterality Date   WISDOM TOOTH EXTRACTION     Family History  Problem Relation Age of Onset   Healthy Mother    Healthy Father    Cancer - Other Maternal Grandfather        stomach cancer   Cancer - Other Paternal Grandfather        Bone cancer   Social History   Socioeconomic History   Marital status: Married    Spouse name: Not on file   Number of children: Not on file   Years of education: Not on file   Highest education level: Some college, no degree  Occupational History   Not on file  Tobacco Use   Smoking status: Never   Smokeless tobacco: Never  Vaping Use   Vaping status: Never Used  Substance and Sexual Activity   Alcohol use: No    Alcohol/week: 0.0 standard drinks of alcohol   Drug use: No   Sexual activity: Yes    Birth control/protection: None    Comment: Vasectomy  Other Topics Concern   Not on file  Social History Narrative   Not on file   Social Drivers of Health   Financial Resource Strain: Low Risk  (10/09/2023)   Overall Financial Resource Strain (CARDIA)    Difficulty of Paying Living Expenses: Not very  hard  Food Insecurity: No Food Insecurity (10/09/2023)   Hunger Vital Sign    Worried About Running Out of Food in the Last Year: Never true    Ran Out of Food in the Last Year: Never true  Transportation Needs: No Transportation Needs (10/09/2023)   PRAPARE - Administrator, Civil Service (Medical): No    Lack of Transportation (Non-Medical): No  Physical Activity: Sufficiently Active (10/09/2023)   Exercise Vital Sign    Days of Exercise per Week: 3 days    Minutes of Exercise per Session: 50 min  Stress: No Stress Concern Present (10/09/2023)   [employer] of Occupational Health - Occupational Stress Questionnaire    Feeling of Stress : Not at all  Social Connections: Socially Integrated (10/09/2023)   Social Connection and Isolation Panel    Frequency of Communication with Friends and Family: More than three times a week    Frequency of Social Gatherings with Friends and Family: Three times a week    Attends Religious Services: More than 4 times per year    Active Member of Clubs or Organizations: Yes    Attends Banker Meetings: More than 4 times per year    Marital  Status: Married     Review of Systems  Constitutional:  Negative for appetite change and unexpected weight change.  HENT:  Negative for congestion, sinus pressure and sore throat.   Eyes:  Negative for pain and visual disturbance.  Respiratory:  Negative for cough, chest tightness and shortness of breath.   Cardiovascular:  Negative for chest pain, palpitations and leg swelling.  Gastrointestinal:  Negative for abdominal pain, diarrhea, nausea and vomiting.  Genitourinary:  Negative for difficulty urinating and dysuria.  Musculoskeletal:  Positive for back pain. Negative for joint swelling and myalgias.  Skin:  Negative for color change and rash.  Neurological:  Negative for dizziness and headaches.  Hematological:  Negative for adenopathy. Does not bruise/bleed easily.   Psychiatric/Behavioral:  Negative for agitation and dysphoric mood.        Objective:     BP 110/70   Pulse 81   Temp 98.2 F (36.8 C) (Oral)   Ht 5' 5.5 (1.664 m)   Wt 172 lb 8 oz (78.2 kg)   SpO2 98%   BMI 28.27 kg/m  Wt Readings from Last 3 Encounters:  06/17/24 172 lb 8 oz (78.2 kg)  04/11/24 161 lb 4.8 oz (73.2 kg)  12/24/23 163 lb 9.6 oz (74.2 kg)    Physical Exam Vitals reviewed.  Constitutional:      General: She is not in acute distress.    Appearance: Normal appearance. She is well-developed.  HENT:     Head: Normocephalic and atraumatic.     Right Ear: External ear normal.     Left Ear: External ear normal.     Mouth/Throat:     Pharynx: No oropharyngeal exudate or posterior oropharyngeal erythema.  Eyes:     General: No scleral icterus.       Right eye: No discharge.        Left eye: No discharge.     Conjunctiva/sclera: Conjunctivae normal.  Neck:     Thyroid : No thyromegaly.  Cardiovascular:     Rate and Rhythm: Normal rate and regular rhythm.  Pulmonary:     Effort: No tachypnea, accessory muscle usage or respiratory distress.     Breath sounds: Normal breath sounds. No decreased breath sounds or wheezing.  Chest:  Breasts:    Right: No inverted nipple, mass, nipple discharge or tenderness (no axillary adenopathy).     Left: No inverted nipple, mass, nipple discharge or tenderness (no axilarry adenopathy).  Abdominal:     General: Bowel sounds are normal.     Palpations: Abdomen is soft.     Tenderness: There is no abdominal tenderness.  Genitourinary:    Comments: Normal external genitalia.  Vaginal vault without lesions.  Cervix identified.  Pap smear performed.  Could not appreciate any adnexal masses or tenderness.   Musculoskeletal:        General: No swelling or tenderness.     Cervical back: Neck supple.  Lymphadenopathy:     Cervical: No cervical adenopathy.  Skin:    Findings: No erythema or rash.  Neurological:     Mental  Status: She is alert and oriented to person, place, and time.  Psychiatric:        Mood and Affect: Mood normal.        Behavior: Behavior normal.         Outpatient Encounter Medications as of 06/17/2024  Medication Sig   [DISCONTINUED] tiZANidine  (ZANAFLEX ) 4 MG tablet Take 1 tablet (4 mg total) by mouth at bedtime as needed.  amLODipine  (NORVASC ) 2.5 MG tablet Take 1 tablet (2.5 mg total) by mouth daily.   FLUoxetine  (PROZAC ) 40 MG capsule Take 1 capsule (40 mg total) by mouth daily.   tiZANidine  (ZANAFLEX ) 4 MG tablet Take 1 tablet (4 mg total) by mouth at bedtime as needed.   [DISCONTINUED] amLODipine  (NORVASC ) 2.5 MG tablet Take 1 tablet (2.5 mg total) by mouth daily.   [DISCONTINUED] FLUoxetine  (PROZAC ) 40 MG capsule Take 1 capsule (40 mg total) by mouth daily.   [DISCONTINUED] methocarbamol  (ROBAXIN ) 500 MG tablet Take 500 mg by mouth 4 (four) times daily. (Patient not taking: Reported on 04/11/2024)   No facility-administered encounter medications on file as of 06/17/2024.     Lab Results  Component Value Date   WBC 6.4 11/22/2023   HGB 14.7 11/22/2023   HCT 43.2 11/22/2023   PLT 338.0 11/22/2023   GLUCOSE 84 06/12/2024   CHOL 251 (H) 06/12/2024   TRIG (H) 06/12/2024    524.0 Triglyceride is over 400; calculations on Lipids are invalid.   HDL 38.20 (L) 06/12/2024   LDLDIRECT 114.0 06/12/2024   LDLCALC 160 (H) 11/22/2023   ALT 17 06/12/2024   AST 16 06/12/2024   NA 138 06/12/2024   K 4.2 06/12/2024   CL 103 06/12/2024   CREATININE 0.61 06/12/2024   BUN 16 06/12/2024   CO2 27 06/12/2024   TSH 1.68 06/12/2024       Assessment & Plan:  Routine general medical examination at a health care facility  Hypercholesterolemia Assessment & Plan: Low cholesterol diet and exercise. Follow lipid panel. Triglycerides > 500. Discussed medication. Work on diet and exercise. If persistent elevation, will need to discuss medication.   Orders: -     Basic metabolic panel with  GFR; Future -     Lipid panel; Future -     TSH; Future  Fatty liver Assessment & Plan: Has seen GI.  Have discussed diet/exercise/weight loss.  Follow liver function tests. Recheck liver panel.   Orders: -     Hepatic function panel; Future  Cervical cancer screening -     Cytology - PAP  Health care maintenance Assessment & Plan: Physical today 06/17/24.  PAP 10/12/20 - negative with negative HPV. Repeat pap today. Mammogram 05/15/23 - birads 0. F/u right breast mammogram and ultrasound - Birads II. Recommended annual screening mammogram.    Muscle spasms of neck -     tiZANidine  HCl; Take 1 tablet (4 mg total) by mouth at bedtime as needed.  Dispense: 30 tablet; Refill: 0  Midline low back pain without sciatica, unspecified chronicity Assessment & Plan: Low back pain as outlined. No radicular symptoms. Tizanidine  as directed. Check xray. Consider PT.   Orders: -     DG Lumbar Spine 2-3 Views; Future  Obsessive-compulsive disorder, unspecified type Assessment & Plan: Continue prozac . Doing well. Follow.    Hypertension, essential Assessment & Plan: Continue amlodipine . Blood pressure as outlined. Follow pressures. Follow metabolic panel.    Episode of recurrent major depressive disorder, unspecified depression episode severity Assessment & Plan: Continue prozac . Stable.    Anxiety Assessment & Plan: Continue prozac . Stable.    Other orders -     amLODIPine  Besylate; Take 1 tablet (2.5 mg total) by mouth daily.  Dispense: 90 tablet; Refill: 2 -     FLUoxetine  HCl; Take 1 capsule (40 mg total) by mouth daily.  Dispense: 90 capsule; Refill: 1     Allena Hamilton, MD

## 2024-06-18 ENCOUNTER — Encounter: Payer: Self-pay | Admitting: Internal Medicine

## 2024-06-18 ENCOUNTER — Ambulatory Visit: Payer: Self-pay | Admitting: Internal Medicine

## 2024-06-18 DIAGNOSIS — M545 Low back pain, unspecified: Secondary | ICD-10-CM

## 2024-06-18 NOTE — Telephone Encounter (Signed)
 Order placed for Pt referral.

## 2024-06-20 LAB — CYTOLOGY - PAP
Comment: NEGATIVE
Diagnosis: NEGATIVE
High risk HPV: NEGATIVE

## 2024-06-22 ENCOUNTER — Encounter: Payer: Self-pay | Admitting: Internal Medicine

## 2024-06-22 NOTE — Assessment & Plan Note (Signed)
 Low back pain as outlined. No radicular symptoms. Tizanidine  as directed. Check xray. Consider PT.

## 2024-06-22 NOTE — Assessment & Plan Note (Signed)
 Low cholesterol diet and exercise. Follow lipid panel. Triglycerides > 500. Discussed medication. Work on diet and exercise. If persistent elevation, will need to discuss medication.

## 2024-06-22 NOTE — Assessment & Plan Note (Signed)
Continue prozac.  Stable.  

## 2024-06-22 NOTE — Assessment & Plan Note (Signed)
 Continue prozac . Doing well. Follow.

## 2024-06-22 NOTE — Assessment & Plan Note (Signed)
Continue amlodipine. Blood pressure as outlined.  Follow pressures.  Follow metabolic panel.  °

## 2024-06-22 NOTE — Assessment & Plan Note (Signed)
 Has seen GI.  Have discussed diet/exercise/weight loss.  Follow liver function tests. Recheck liver panel.

## 2024-07-05 ENCOUNTER — Telehealth: Payer: Self-pay | Admitting: Family Medicine

## 2024-07-05 DIAGNOSIS — R399 Unspecified symptoms and signs involving the genitourinary system: Secondary | ICD-10-CM

## 2024-07-05 MED ORDER — CEPHALEXIN 500 MG PO CAPS: 500.0000 mg | ORAL_CAPSULE | Freq: Two times a day (BID) | ORAL | 0 refills | Status: AC

## 2024-07-05 NOTE — Progress Notes (Signed)

## 2024-07-15 ENCOUNTER — Other Ambulatory Visit: Payer: Self-pay | Admitting: Internal Medicine

## 2024-07-15 DIAGNOSIS — M62838 Other muscle spasm: Secondary | ICD-10-CM

## 2024-07-15 NOTE — Telephone Encounter (Signed)
 Received request for refill zanaflex . This was given to her for low back pain - acute issue. Please call her and confirm = does she still need? Is she having persistent problems?

## 2024-08-08 ENCOUNTER — Emergency Department
Admission: EM | Admit: 2024-08-08 | Discharge: 2024-08-09 | Disposition: A | Discharge status: Home / Self Care | Payer: Self-pay

## 2024-08-08 ENCOUNTER — Other Ambulatory Visit: Payer: Self-pay

## 2024-08-08 ENCOUNTER — Encounter: Payer: Self-pay | Admitting: Emergency Medicine

## 2024-08-08 ENCOUNTER — Emergency Department: Payer: Self-pay

## 2024-08-08 DIAGNOSIS — I10 Essential (primary) hypertension: Secondary | ICD-10-CM | POA: Insufficient documentation

## 2024-08-08 DIAGNOSIS — R519 Headache, unspecified: Secondary | ICD-10-CM | POA: Insufficient documentation

## 2024-08-08 DIAGNOSIS — R059 Cough, unspecified: Secondary | ICD-10-CM | POA: Insufficient documentation

## 2024-08-08 DIAGNOSIS — R079 Chest pain, unspecified: Secondary | ICD-10-CM

## 2024-08-08 DIAGNOSIS — R0789 Other chest pain: Secondary | ICD-10-CM | POA: Insufficient documentation

## 2024-08-08 HISTORY — DX: Essential (primary) hypertension: I10

## 2024-08-08 LAB — CBC
HCT: 41.2 % (ref 36.0–46.0)
Hemoglobin: 14 g/dL (ref 12.0–15.0)
MCH: 30.3 pg (ref 26.0–34.0)
MCHC: 34 g/dL (ref 30.0–36.0)
MCV: 89.2 fL (ref 80.0–100.0)
Platelets: 355 K/uL (ref 150–400)
RBC: 4.62 MIL/uL (ref 3.87–5.11)
RDW: 12.3 % (ref 11.5–15.5)
WBC: 9.6 K/uL (ref 4.0–10.5)
nRBC: 0 % (ref 0.0–0.2)

## 2024-08-08 LAB — BASIC METABOLIC PANEL WITH GFR
Anion gap: 16 — ABNORMAL HIGH (ref 5–15)
BUN: 12 mg/dL (ref 6–20)
CO2: 20 mmol/L — ABNORMAL LOW (ref 22–32)
Calcium: 9.1 mg/dL (ref 8.9–10.3)
Chloride: 104 mmol/L (ref 98–111)
Creatinine, Ser: 0.51 mg/dL (ref 0.44–1.00)
GFR, Estimated: >60 mL/min
Glucose, Bld: 82 mg/dL (ref 70–99)
Potassium: 3.7 mmol/L (ref 3.5–5.1)
Sodium: 140 mmol/L (ref 135–145)

## 2024-08-08 LAB — TROPONIN T, HIGH SENSITIVITY: Troponin T High Sensitivity: <15 ng/L (ref 0–19)

## 2024-08-08 NOTE — ED Triage Notes (Signed)
 Pt to ED via POV with c/o CP, HA, and hand numbness. Pt states symptoms started 12/24, resolved, and then was at work today when she started to have chest pain again. Pt states had BP checked and was told it was 165/95 by EMS and was referred here to be checked out.

## 2024-08-08 NOTE — ED Provider Notes (Signed)
 "  [facility] Provider Note    Event Date/Time   First MD Initiated Contact with Patient 08/08/24 2347     (approximate)   History   Chest Pain  Pt to ED via POV with c/o CP, HA, and hand numbness. Pt states symptoms started 12/24, resolved, and then was at work today when she started to have chest pain again. Pt states had BP checked and was told it was 165/95 by EMS and was referred here to be checked out.    HPI Becky Mccoy is a 38 y.o. female PMH OCD, hyperlipidemia, hypertension, chest discomfort, RAD, acid reflux, colitis presents for evaluation of chest pain - Patient developed episodes of chest discomfort, substernal across left upper anterior chest earlier this evening around 7 PM.  Intermittent, lasting few minutes at a time.  Did note some paresthesias in her bilateral arms and decided to come to emergency department for eval.  No significant abdominal pain. - Separately has had some intermittent headaches over the past few days and mild cough.  Not sudden onset headache, not worst headache of life, no focal weakness.  No preceding trauma, not on blood thinners.  Headache resolved with Tylenol . - Has otherwise been in her usual state of health.  No known sick contacts. - Did recently help her husband with some work on a car and did some heavy lifting with that, wonders if this may have caused muscle strain - Does take Pepcid as needed for GERD  Per chart review, seen in clinic earlier this year and noted to have chronic upper and lower back pain as well as neck muscle spasms     Physical Exam   Triage Vital Signs: ED Triage Vitals  Encounter Vitals Group     BP 08/08/24 2034 (!) 135/90     Girls Systolic BP Percentile --      Girls Diastolic BP Percentile --      Boys Systolic BP Percentile --      Boys Diastolic BP Percentile --      Pulse Rate 08/08/24 2034 97     Resp 08/08/24 2034 18     Temp 08/08/24 2034 99.4 F (37.4 C)     Temp  Source 08/08/24 2034 Oral     SpO2 08/08/24 2034 97 %     Weight 08/08/24 2034 172 lb (78 kg)     Height 08/08/24 2034 5' 5 (1.651 m)     Head Circumference --      Peak Flow --      Pain Score 08/08/24 2040 5     Pain Loc --      Pain Education --      Exclude from Growth Chart --     Most recent vital signs: Vitals:   08/08/24 2034  BP: (!) 135/90  Pulse: 97  Resp: 18  Temp: 99.4 F (37.4 C)  SpO2: 97%     General: Awake, no distress.  HEENT: Normocephalic, atraumatic CV:  Good peripheral perfusion. RRR, RP 2+ and equal pulses all extremities. + Tender to palpation over left anterior chest wall. Resp:  Normal effort. CTAB Abd:  No distention. Nontender to deep palpation throughout Neuro:  Aox4, CN II-XII intact, FNF wnl, finger taps fast b/l, 5/5 strength in bilateral finger extension/grip, arm flexion/extension, EHL/FHL. BUE AG 10+ sec no drift, BLE AG 5+ sec no drift. Ambulates with steady gait. SILT.     ED Results / Procedures / Treatments  Labs (all labs ordered are listed, but only abnormal results are displayed) Labs Reviewed  BASIC METABOLIC PANEL WITH GFR - Abnormal; Notable for the following components:      Result Value   CO2 20 (*)    Anion gap 16 (*)    All other components within normal limits  CBC  POC URINE PREG, ED  TROPONIN T, HIGH SENSITIVITY  TROPONIN T, HIGH SENSITIVITY     EKG  See ED course.    RADIOLOGY Radiology interpreted by myself radiology report reviewed.  No acute pathology identified.    PROCEDURES:  Critical Care performed: No  Procedures   MEDICATIONS ORDERED IN ED: Medications - No data to display   IMPRESSION / MDM / ASSESSMENT AND PLAN / ED COURSE  I reviewed the triage vital signs and the nursing notes.                              DDX/MDM/AP: Differential diagnosis includes, but is not limited to, ACS, GERD, MSK strain, highly doubt aortic dissection, pneumothorax, abdominal pathology.  Consider  anxiety contributing.  Regarding headache, no red flags, suspect benign headache type, do not clinically suspect intracranial hemorrhage or mass.  Evidence of stroke on my eval do not clinically suspect TIA.  Plan: - Labs  - ecg - cxr - Offered viral swab for flu testing, patient declines   Patient's presentation is most consistent with acute presentation with potential threat to life or bodily function.   ED course below.  Workup unremarkable including serial troponins.  Remains asymptomatic here in emergency department with reassuring evaluation.  No evidence of acute pathology at this time, suspect likely GERD and/or MSK strain contributing to presentation, doubt cardiac etiology.  Recommend she take her Pepcid daily and also Rx Tylenol .  Plan for PMD follow-up.  ED return precautions in place.  Patient agrees with plan.  Clinical Course as of 08/09/24 0104  Sat Aug 09, 2024  0050 CBC, BMP reviewed, overall markable.  Bicarb mildly low.  Troponin negative x 2 [MM]  0050 CXR: IMPRESSION: 1. No acute findings. Stable chest.   [MM]  0050 Ecg = sinus rhythm, rate 91, no gross ST elevation or depression, no significant repolarization abnormality, normal axis, normal intervals.  No clear evidence of ischemia no arrhythmia my interpretation. [MM]    Clinical Course User Index [MM] Clarine Ozell LABOR, MD     FINAL CLINICAL IMPRESSION(S) / ED DIAGNOSES   Final diagnoses:  Nonspecific chest pain     Rx / DC Orders   ED Discharge Orders          Ordered    acetaminophen  (TYLENOL ) 500 MG tablet  Every 6 hours PRN        08/09/24 0059             Note:  This document was prepared using Dragon voice recognition software and may include unintentional dictation errors.   Clarine Ozell LABOR, MD 08/09/24 0104  "

## 2024-08-08 NOTE — ED Notes (Signed)
POC was negative 

## 2024-08-09 LAB — TROPONIN T, HIGH SENSITIVITY: Troponin T High Sensitivity: <15 ng/L (ref 0–19)

## 2024-08-09 MED ORDER — ACETAMINOPHEN 500 MG PO TABS: 1000.0000 mg | ORAL_TABLET | Freq: Four times a day (QID) | ORAL | 2 refills | Status: AC | PRN

## 2024-08-09 NOTE — Discharge Instructions (Signed)
 Your evaluation in the emergency department was overall reassuring.  It is possible you have a muscle strain of your chest wall, or this may be an exacerbation of your acid reflux.  I would start taking your Pepcid daily over the next 1-2 weeks to help with this, and I have also prescribed Tylenol  to use as needed for any ongoing discomfort.  Please do follow-up with your primary care provider for reevaluation, and return to the emergency department with any new or worsening symptoms.

## 2024-08-26 ENCOUNTER — Ambulatory Visit: Payer: Self-pay

## 2024-08-26 ENCOUNTER — Encounter: Payer: Self-pay | Admitting: Internal Medicine

## 2024-08-26 NOTE — Telephone Encounter (Signed)
 FYI Only or Action Required?: FYI only for provider: appointment scheduled on tomorrow.  Patient was last seen in primary care on 06/17/2024 by Glendia Shad, MD.  Called Nurse Triage reporting Back Pain.  Symptoms began a week ago.  Interventions attempted: OTC medications: Tylenol  and IBU, Prescription medications: Muscle relaxer, and Ice/heat application.  Symptoms are: unchanged.  Triage Disposition: See PCP When Office is Open (Within 3 Days)  Patient/caregiver understands and will follow disposition?: Yes                 Copied from CRM #[id number]. Topic: Clinical - Red Word Triage >> Aug 26, 2024  4:32 PM Viola F wrote: Red Word that prompted transfer to Nurse Triage: Patient having back pain - requesting appt this week Reason for Disposition  [1] MODERATE back pain (e.g., interferes with normal activities) AND [2] present > 3 days  Answer Assessment - Initial Assessment Questions 1. ONSET: When did the pain begin? (e.g., minutes, hours, days)     6 nights ago 2. LOCATION: Where does it hurt? (upper, mid or lower back)     Lower right - flank 3. SEVERITY: How bad is the pain?  (e.g., Scale 1-10; mild, moderate, or severe)     6-7/10 4. PATTERN: Is the pain constant? (e.g., yes, no; constant, intermittent)      Constant - does hurt when laying flat 5. RADIATION: Does the pain shoot into your legs or somewhere else?     Into her behind 6. CAUSE:  What do you think is causing the back pain?      Pulled muscle 7. BACK OVERUSE:  Any recent lifting of heavy objects, strenuous work or exercise?     no 8. MEDICINES: What have you taken so far for the pain? (e.g., nothing, acetaminophen , NSAIDS)     IBU, Tylenol , Muscle relaxer 9. NEUROLOGIC SYMPTOMS: Do you have any weakness, numbness, or problems with bowel/bladder control?     no 10. OTHER SYMPTOMS: Do you have any other symptoms? (e.g., fever, abdomen pain, burning with urination, blood  in urine)       no 11. PREGNANCY: Is there any chance you are pregnant? When was your last menstrual period?       no  Protocols used: Back Pain-A-AH

## 2024-08-27 ENCOUNTER — Ambulatory Visit: Payer: Self-pay

## 2024-08-27 ENCOUNTER — Other Ambulatory Visit: Payer: Self-pay

## 2024-08-27 VITALS — BP 127/79 | HR 93 | Temp 99.3°F | Wt 180.4 lb

## 2024-08-27 DIAGNOSIS — M5431 Sciatica, right side: Secondary | ICD-10-CM | POA: Diagnosis not present

## 2024-08-27 DIAGNOSIS — E7800 Pure hypercholesterolemia, unspecified: Secondary | ICD-10-CM

## 2024-08-27 DIAGNOSIS — E66811 Obesity, class 1: Secondary | ICD-10-CM | POA: Insufficient documentation

## 2024-08-27 MED ORDER — WEGOVY 0.25 MG/0.5ML ~~LOC~~ SOAJ: 0.2500 mg | SUBCUTANEOUS | 0 refills | Status: DC

## 2024-08-27 MED ORDER — METHYLPREDNISOLONE 4 MG PO TBPK: ORAL_TABLET | ORAL | 0 refills | Status: DC

## 2024-08-27 MED ORDER — METHYLPREDNISOLONE 4 MG PO TBPK: ORAL_TABLET | ORAL | 0 refills | Status: AC

## 2024-08-27 NOTE — Progress Notes (Signed)
 "    Acute visit   Patient: Becky Mccoy   DOB: [birth year]   38 y.o. Female  MRN: [id number] PCP: Franchot Isaiah LABOR, MD   Chief Complaint  Patient presents with   Back Pain    Flank of the right side back Started 1 week ago and was taking muscle relaxer and over the counter medication and nothing helps    Weight Loss    Would like zepbound    Subjective    Discussed the use of AI scribe software for clinical note transcription with the patient, who gave verbal consent to proceed.  History of Present Illness Becky Mccoy is a 39 year old female who presents with severe lower back pain.  She has been experiencing severe lower back pain for approximately one week. The pain is localized to the lower back and does not radiate down her leg, though she describes a burning sensation upon movement. Various treatments including tizanidine , ibuprofen , Tylenol , stretching exercises, topical rubs, baths, and heating pads have provided minimal relief. 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 No urinary symptoms such as burning or blood in urine.   She has a history of taking Prozac  for ten years, which has increased her appetite and contributed to weight gain. She is concerned about her high triglyceride levels, which were previously measured at over 500, and her overall cholesterol levels, which have been elevated.  Family history includes her mother having her thyroid  removed, not due to cancer. No personal history of thyroid  cancer or pancreatitis.    Review of systems as noted in HPI.   Objective    BP 127/79   Pulse 93   Temp 99.3 F (37.4 C) (Oral)   Wt 180 lb 6.4 oz (81.8 kg)   LMP 08/03/2024   BMI 30.02 kg/m  Physical Exam Constitutional:      Appearance: Normal appearance.  HENT:     Head: Normocephalic and atraumatic.     Mouth/Throat:      Mouth: Mucous membranes are moist.  Eyes:     Pupils: Pupils are equal, round, and reactive to light.  Pulmonary:     Effort: Pulmonary effort is normal.  Musculoskeletal:     Lumbar back: Tenderness present. No spasms or bony tenderness. Positive right straight leg raise test.  Skin:    General: Skin is warm.  Neurological:     General: No focal deficit present.     Mental Status: She is alert.       No results found for any visits on 08/27/24.  Assessment & Plan     Problem List Items Addressed This Visit       Nervous and Auditory   Sciatica of right side - Primary   Relevant Medications   semaglutide -weight management (WEGOVY ) 0.25 MG/0.5ML SOAJ SQ injection   methylPREDNISolone  (MEDROL  DOSEPAK) 4 MG TBPK tablet     Other   Hypercholesterolemia   Obesity (BMI 30.0-34.9)   Relevant Medications   semaglutide -weight management (WEGOVY ) 0.25 MG/0.5ML SOAJ SQ injection   Assessment & Plan Sciatica, right side Patient with 1 week hx of burning R lumbar back pain most likely due to sciatica. Pain unrelieved by current medications. - Prescribed Medrol  Dose Pack - Advised continuation of ibuprofen  and Tylenol  as needed for pain. - Provided sciatica rehabilitation exercises for home use. - Consider referral to  physical therapy if symptoms persist.  Obesity BMI 30. Comorbidity of HLD. Difficulty losing weight with diet and exercise alone. Prozac  has led to weight gain. Previously prescribed Zepbound  but did not take due to insurance coverage. Interested in starting Wegovy  due to cost and pharmacy availability.  - Prescribed Wegovy  0.25mg  weekly - Discussed potential side effects of Wegovy , including nausea and constipation. - Advised on lifestyle modifications including exercise and dietary changes.  Hyperlipidemia Elevated triglycerides and cholesterol on recent lab work - Scheduled follow-up in one month to monitor lipid levels - Discussed potential need for cholesterol  medication if lifestyle changes are insufficient.   Meds ordered this encounter  Medications   DISCONTD: methylPREDNISolone  (MEDROL  DOSEPAK) 4 MG TBPK tablet    Sig: Follow instructions on dosepack.    Dispense:  21 each    Refill:  0   semaglutide -weight management (WEGOVY ) 0.25 MG/0.5ML SOAJ SQ injection    Sig: Inject 0.25 mg into the skin once a week.    Dispense:  2 mL    Refill:  0   methylPREDNISolone  (MEDROL  DOSEPAK) 4 MG TBPK tablet    Sig: Follow instructions on dosepack.    Dispense:  21 each    Refill:  0     Return in about 4 weeks (around 09/24/2024) for Weight loss, follow up.      Isaiah DELENA Pepper, MD  [facility] [phone] (phone) [phone] (fax) "

## 2024-10-01 ENCOUNTER — Ambulatory Visit

## 2024-10-09 ENCOUNTER — Other Ambulatory Visit

## 2024-10-14 ENCOUNTER — Ambulatory Visit: Admitting: Internal Medicine
